# Patient Record
Sex: Female | Born: 1980
Health system: Southern US, Community
[De-identification: ages and names within clinical notes are randomized; demographics above are authoritative.]

## PROBLEM LIST (undated history)

## (undated) DIAGNOSIS — M19172 Post-traumatic osteoarthritis, left ankle and foot: Secondary | ICD-10-CM

## (undated) DIAGNOSIS — Z98811 Dental restoration status: Secondary | ICD-10-CM

## (undated) DIAGNOSIS — T8859XA Other complications of anesthesia, initial encounter: Secondary | ICD-10-CM

## (undated) DIAGNOSIS — G44209 Tension-type headache, unspecified, not intractable: Secondary | ICD-10-CM

## (undated) DIAGNOSIS — Z969 Presence of functional implant, unspecified: Secondary | ICD-10-CM

## (undated) DIAGNOSIS — T4145XA Adverse effect of unspecified anesthetic, initial encounter: Secondary | ICD-10-CM

---

## 1999-05-15 ENCOUNTER — Ambulatory Visit: Admission: RE | Admit: 1999-05-15 | Discharge: 1999-05-15 | Payer: Self-pay | Admitting: Occupational Medicine

## 1999-05-15 ENCOUNTER — Encounter: Payer: Self-pay | Admitting: Occupational Medicine

## 2000-01-04 ENCOUNTER — Emergency Department (HOSPITAL_COMMUNITY): Admission: EM | Admit: 2000-01-04 | Discharge: 2000-01-04 | Payer: Self-pay | Admitting: Emergency Medicine

## 2000-01-15 ENCOUNTER — Emergency Department (HOSPITAL_COMMUNITY): Admission: EM | Admit: 2000-01-15 | Discharge: 2000-01-15 | Payer: Self-pay | Admitting: Emergency Medicine

## 2000-01-21 ENCOUNTER — Emergency Department (HOSPITAL_COMMUNITY): Admission: EM | Admit: 2000-01-21 | Discharge: 2000-01-21 | Payer: Self-pay | Admitting: *Deleted

## 2000-01-27 ENCOUNTER — Emergency Department (HOSPITAL_COMMUNITY): Admission: EM | Admit: 2000-01-27 | Discharge: 2000-01-27 | Payer: Self-pay

## 2000-01-31 ENCOUNTER — Emergency Department (HOSPITAL_COMMUNITY): Admission: EM | Admit: 2000-01-31 | Discharge: 2000-01-31 | Payer: Self-pay | Admitting: Internal Medicine

## 2000-02-23 ENCOUNTER — Emergency Department (HOSPITAL_COMMUNITY): Admission: EM | Admit: 2000-02-23 | Discharge: 2000-02-23 | Payer: Self-pay | Admitting: Emergency Medicine

## 2000-02-24 ENCOUNTER — Emergency Department (HOSPITAL_COMMUNITY): Admission: EM | Admit: 2000-02-24 | Discharge: 2000-02-25 | Payer: Self-pay | Admitting: *Deleted

## 2000-02-25 ENCOUNTER — Encounter: Payer: Self-pay | Admitting: Emergency Medicine

## 2000-02-25 ENCOUNTER — Emergency Department (HOSPITAL_COMMUNITY): Admission: EM | Admit: 2000-02-25 | Discharge: 2000-02-25 | Payer: Self-pay | Admitting: Internal Medicine

## 2000-03-10 ENCOUNTER — Emergency Department (HOSPITAL_COMMUNITY): Admission: EM | Admit: 2000-03-10 | Discharge: 2000-03-10 | Payer: Self-pay | Admitting: Internal Medicine

## 2000-03-20 ENCOUNTER — Emergency Department (HOSPITAL_COMMUNITY): Admission: EM | Admit: 2000-03-20 | Discharge: 2000-03-20 | Payer: Self-pay | Admitting: Emergency Medicine

## 2000-04-06 ENCOUNTER — Emergency Department (HOSPITAL_COMMUNITY): Admission: EM | Admit: 2000-04-06 | Discharge: 2000-04-06 | Payer: Self-pay | Admitting: Emergency Medicine

## 2000-12-15 ENCOUNTER — Other Ambulatory Visit: Admission: RE | Admit: 2000-12-15 | Discharge: 2000-12-15 | Payer: Self-pay | Admitting: Obstetrics & Gynecology

## 2001-01-14 ENCOUNTER — Encounter: Payer: Self-pay | Admitting: Emergency Medicine

## 2001-01-14 ENCOUNTER — Emergency Department (HOSPITAL_COMMUNITY): Admission: EM | Admit: 2001-01-14 | Discharge: 2001-01-14 | Payer: Self-pay | Admitting: Emergency Medicine

## 2001-02-25 ENCOUNTER — Inpatient Hospital Stay (HOSPITAL_COMMUNITY): Admission: AD | Admit: 2001-02-25 | Discharge: 2001-02-25 | Payer: Self-pay | Admitting: Obstetrics and Gynecology

## 2001-02-26 ENCOUNTER — Inpatient Hospital Stay (HOSPITAL_COMMUNITY): Admission: AD | Admit: 2001-02-26 | Discharge: 2001-02-26 | Payer: Self-pay | Admitting: Obstetrics and Gynecology

## 2001-02-26 ENCOUNTER — Encounter: Payer: Self-pay | Admitting: Obstetrics and Gynecology

## 2001-05-09 ENCOUNTER — Inpatient Hospital Stay (HOSPITAL_COMMUNITY): Admission: AD | Admit: 2001-05-09 | Discharge: 2001-05-09 | Payer: Self-pay | Admitting: Obstetrics and Gynecology

## 2001-06-21 ENCOUNTER — Inpatient Hospital Stay (HOSPITAL_COMMUNITY): Admission: AD | Admit: 2001-06-21 | Discharge: 2001-06-21 | Payer: Self-pay | Admitting: Obstetrics and Gynecology

## 2001-06-28 ENCOUNTER — Inpatient Hospital Stay (HOSPITAL_COMMUNITY): Admission: AD | Admit: 2001-06-28 | Discharge: 2001-07-01 | Payer: Self-pay | Admitting: Obstetrics & Gynecology

## 2009-06-11 ENCOUNTER — Emergency Department (HOSPITAL_COMMUNITY): Admission: EM | Admit: 2009-06-11 | Discharge: 2009-06-11 | Payer: Self-pay | Admitting: Emergency Medicine

## 2009-11-08 ENCOUNTER — Emergency Department (HOSPITAL_COMMUNITY): Admission: EM | Admit: 2009-11-08 | Discharge: 2009-11-08 | Payer: Self-pay | Admitting: Emergency Medicine

## 2010-08-19 ENCOUNTER — Emergency Department (HOSPITAL_COMMUNITY)
Admission: EM | Admit: 2010-08-19 | Discharge: 2010-08-19 | Payer: Self-pay | Source: Home / Self Care | Admitting: Family Medicine

## 2010-08-20 LAB — POCT RAPID STREP A (OFFICE): Streptococcus, Group A Screen (Direct): NEGATIVE

## 2010-10-24 LAB — URINE MICROSCOPIC-ADD ON

## 2010-10-24 LAB — COMPREHENSIVE METABOLIC PANEL
ALT: 14 U/L (ref 0–35)
AST: 17 U/L (ref 0–37)
Albumin: 4.1 g/dL (ref 3.5–5.2)
Alkaline Phosphatase: 89 U/L (ref 39–117)
BUN: 6 mg/dL (ref 6–23)
CO2: 24 mEq/L (ref 19–32)
Calcium: 8.7 mg/dL (ref 8.4–10.5)
Chloride: 107 mEq/L (ref 96–112)
Creatinine, Ser: 0.6 mg/dL (ref 0.4–1.2)
GFR calc Af Amer: >60 mL/min (ref >60)
GFR calc non Af Amer: >60 mL/min (ref >60)
Glucose, Bld: 118 mg/dL — ABNORMAL HIGH (ref 70–99)
Potassium: 3.5 mEq/L (ref 3.5–5.1)
Sodium: 139 mEq/L (ref 135–145)
Total Bilirubin: 0.3 mg/dL (ref 0.3–1.2)
Total Protein: 7.6 g/dL (ref 6.0–8.3)

## 2010-10-24 LAB — DIFFERENTIAL
Basophils Absolute: 0 10*3/uL (ref 0.0–0.1)
Basophils Relative: 0 % (ref 0–1)
Eosinophils Absolute: 0 10*3/uL (ref 0.0–0.7)
Eosinophils Relative: 0 % (ref 0–5)
Lymphocytes Relative: 21 % (ref 12–46)
Lymphs Abs: 1.1 10*3/uL (ref 0.7–4.0)
Monocytes Absolute: 0.3 10*3/uL (ref 0.1–1.0)
Monocytes Relative: 5 % (ref 3–12)
Neutro Abs: 4 10*3/uL (ref 1.7–7.7)
Neutrophils Relative %: 74 % (ref 43–77)

## 2010-10-24 LAB — URINALYSIS, ROUTINE W REFLEX MICROSCOPIC
Bilirubin Urine: NEGATIVE
Glucose, UA: NEGATIVE mg/dL
Hgb urine dipstick: NEGATIVE
Ketones, ur: NEGATIVE mg/dL
Nitrite: NEGATIVE
Protein, ur: NEGATIVE mg/dL
Specific Gravity, Urine: 1.021 (ref 1.005–1.030)
Urobilinogen, UA: 0.2 mg/dL (ref 0.0–1.0)
pH: 5.5 (ref 5.0–8.0)

## 2010-10-24 LAB — CBC
HCT: 39.1 % (ref 36.0–46.0)
Hemoglobin: 13.1 g/dL (ref 12.0–15.0)
MCHC: 33.6 g/dL (ref 30.0–36.0)
MCV: 84.6 fL (ref 78.0–100.0)
Platelets: 236 10*3/uL (ref 150–400)
RBC: 4.62 MIL/uL (ref 3.87–5.11)
RDW: 14 % (ref 11.5–15.5)
WBC: 5.5 10*3/uL (ref 4.0–10.5)

## 2010-10-24 LAB — SALICYLATE LEVEL: Salicylate Lvl: <4 mg/dL (ref 2.8–20.0)

## 2010-10-24 LAB — PROTIME-INR
INR: 1.19 (ref 0.00–1.49)
Prothrombin Time: 15 seconds (ref 11.6–15.2)

## 2010-10-24 LAB — ACETAMINOPHEN LEVEL
Acetaminophen (Tylenol), Serum: 10.8 ug/mL (ref 10–30)
Acetaminophen (Tylenol), Serum: 15.7 ug/mL (ref 10–30)

## 2010-10-24 LAB — POCT PREGNANCY, URINE: Preg Test, Ur: NEGATIVE

## 2010-10-24 LAB — PREGNANCY, URINE: Preg Test, Ur: NEGATIVE

## 2010-11-07 LAB — BASIC METABOLIC PANEL
BUN: 8 mg/dL (ref 6–23)
CO2: 27 mEq/L (ref 19–32)
Calcium: 9.1 mg/dL (ref 8.4–10.5)
Chloride: 106 mEq/L (ref 96–112)
Creatinine, Ser: 0.56 mg/dL (ref 0.4–1.2)
GFR calc Af Amer: >60 mL/min (ref >60)
GFR calc non Af Amer: >60 mL/min (ref >60)
Glucose, Bld: 109 mg/dL — ABNORMAL HIGH (ref 70–99)
Potassium: 3.6 mEq/L (ref 3.5–5.1)
Sodium: 139 mEq/L (ref 135–145)

## 2010-11-07 LAB — URINALYSIS, ROUTINE W REFLEX MICROSCOPIC
Glucose, UA: NEGATIVE mg/dL
Hgb urine dipstick: NEGATIVE
Nitrite: NEGATIVE
Protein, ur: NEGATIVE mg/dL
Specific Gravity, Urine: 1.035 — ABNORMAL HIGH (ref 1.005–1.030)
Urobilinogen, UA: 1 mg/dL (ref 0.0–1.0)
pH: 6 (ref 5.0–8.0)

## 2010-11-07 LAB — DIFFERENTIAL
Basophils Absolute: 0 10*3/uL (ref 0.0–0.1)
Basophils Relative: 0 % (ref 0–1)
Eosinophils Absolute: 0 10*3/uL (ref 0.0–0.7)
Eosinophils Relative: 0 % (ref 0–5)
Lymphocytes Relative: 24 % (ref 12–46)
Lymphs Abs: 1.6 10*3/uL (ref 0.7–4.0)
Monocytes Absolute: 0.5 10*3/uL (ref 0.1–1.0)
Monocytes Relative: 8 % (ref 3–12)
Neutro Abs: 4.3 10*3/uL (ref 1.7–7.7)
Neutrophils Relative %: 67 % (ref 43–77)

## 2010-11-07 LAB — CBC
HCT: 38.6 % (ref 36.0–46.0)
Hemoglobin: 13 g/dL (ref 12.0–15.0)
MCHC: 33.8 g/dL (ref 30.0–36.0)
MCV: 83.9 fL (ref 78.0–100.0)
Platelets: 224 10*3/uL (ref 150–400)
RBC: 4.6 MIL/uL (ref 3.87–5.11)
RDW: 13.6 % (ref 11.5–15.5)
WBC: 6.4 10*3/uL (ref 4.0–10.5)

## 2010-11-07 LAB — POCT CARDIAC MARKERS
CKMB, poc: <1 ng/mL — ABNORMAL LOW (ref 1.0–8.0)
Myoglobin, poc: 54.4 ng/mL (ref 12–200)
Troponin i, poc: <0.05 ng/mL (ref 0.00–0.09)

## 2010-11-07 LAB — D-DIMER, QUANTITATIVE: D-Dimer, Quant: 0.27 ug/mL-FEU (ref 0.00–0.48)

## 2010-11-07 LAB — PREGNANCY, URINE: Preg Test, Ur: NEGATIVE

## 2014-12-20 ENCOUNTER — Encounter (HOSPITAL_COMMUNITY): Payer: Self-pay | Admitting: *Deleted

## 2014-12-20 ENCOUNTER — Emergency Department (HOSPITAL_COMMUNITY): Payer: PRIVATE HEALTH INSURANCE

## 2014-12-20 ENCOUNTER — Emergency Department (HOSPITAL_COMMUNITY)
Admission: EM | Admit: 2014-12-20 | Discharge: 2014-12-20 | Disposition: A | Discharge status: Home / Self Care | Payer: PRIVATE HEALTH INSURANCE | Attending: Emergency Medicine | Admitting: Emergency Medicine

## 2014-12-20 DIAGNOSIS — W1839XA Other fall on same level, initial encounter: Secondary | ICD-10-CM | POA: Diagnosis not present

## 2014-12-20 DIAGNOSIS — S82852A Displaced trimalleolar fracture of left lower leg, initial encounter for closed fracture: Secondary | ICD-10-CM | POA: Insufficient documentation

## 2014-12-20 DIAGNOSIS — S82892A Other fracture of left lower leg, initial encounter for closed fracture: Secondary | ICD-10-CM

## 2014-12-20 DIAGNOSIS — S99912A Unspecified injury of left ankle, initial encounter: Secondary | ICD-10-CM | POA: Diagnosis present

## 2014-12-20 DIAGNOSIS — Y9289 Other specified places as the place of occurrence of the external cause: Secondary | ICD-10-CM | POA: Insufficient documentation

## 2014-12-20 DIAGNOSIS — Y998 Other external cause status: Secondary | ICD-10-CM | POA: Diagnosis not present

## 2014-12-20 DIAGNOSIS — Y9389 Activity, other specified: Secondary | ICD-10-CM | POA: Insufficient documentation

## 2014-12-20 LAB — BASIC METABOLIC PANEL
Anion gap: 12 (ref 5–15)
BUN: 9 mg/dL (ref 6–20)
CALCIUM: 8.8 mg/dL — AB (ref 8.9–10.3)
CO2: 21 mmol/L — AB (ref 22–32)
Chloride: 106 mmol/L (ref 101–111)
Creatinine, Ser: 0.61 mg/dL (ref 0.44–1.00)
GFR calc non Af Amer: >60 mL/min (ref >60)
Glucose, Bld: 97 mg/dL (ref 65–99)
Potassium: 3.3 mmol/L — ABNORMAL LOW (ref 3.5–5.1)
SODIUM: 139 mmol/L (ref 135–145)

## 2014-12-20 LAB — CBC WITH DIFFERENTIAL/PLATELET
BASOS PCT: 0 % (ref 0–1)
Basophils Absolute: 0 10*3/uL (ref 0.0–0.1)
Eosinophils Absolute: 0.1 10*3/uL (ref 0.0–0.7)
Eosinophils Relative: 1 % (ref 0–5)
HEMATOCRIT: 36.7 % (ref 36.0–46.0)
HEMOGLOBIN: 12.4 g/dL (ref 12.0–15.0)
LYMPHS ABS: 2.2 10*3/uL (ref 0.7–4.0)
Lymphocytes Relative: 29 % (ref 12–46)
MCH: 27.1 pg (ref 26.0–34.0)
MCHC: 33.8 g/dL (ref 30.0–36.0)
MCV: 80.1 fL (ref 78.0–100.0)
MONO ABS: 0.6 10*3/uL (ref 0.1–1.0)
MONOS PCT: 8 % (ref 3–12)
NEUTROS ABS: 4.8 10*3/uL (ref 1.7–7.7)
Neutrophils Relative %: 62 % (ref 43–77)
Platelets: 275 10*3/uL (ref 150–400)
RBC: 4.58 MIL/uL (ref 3.87–5.11)
RDW: 13.3 % (ref 11.5–15.5)
WBC: 7.6 10*3/uL (ref 4.0–10.5)

## 2014-12-20 MED ORDER — PROPOFOL 10 MG/ML IV BOLUS
INTRAVENOUS | Status: AC | PRN
  Administered 2014-12-20: 30 mg via INTRAVENOUS

## 2014-12-20 MED ORDER — MORPHINE SULFATE 4 MG/ML IJ SOLN
4.0000 mg | Freq: Once | INTRAMUSCULAR | Status: AC
  Administered 2014-12-20: 4 mg via INTRAVENOUS
  Filled 2014-12-20: qty 1

## 2014-12-20 MED ORDER — PROPOFOL 10 MG/ML IV BOLUS
INTRAVENOUS | Status: AC | PRN
  Administered 2014-12-20: 20 mg via INTRAVENOUS

## 2014-12-20 MED ORDER — SODIUM CHLORIDE 0.9 % IV BOLUS (SEPSIS)
1000.0000 mL | Freq: Once | INTRAVENOUS | Status: AC
  Administered 2014-12-20: 1000 mL via INTRAVENOUS

## 2014-12-20 MED ORDER — PROPOFOL 10 MG/ML IV BOLUS
0.5000 mg/kg | Freq: Once | INTRAVENOUS | Status: DC
  Filled 2014-12-20: qty 1

## 2014-12-20 MED ORDER — HYDROMORPHONE HCL 1 MG/ML IJ SOLN
1.0000 mg | Freq: Once | INTRAMUSCULAR | Status: AC
Start: 2014-12-20 — End: 2014-12-20
  Administered 2014-12-20: 1 mg via INTRAVENOUS
  Filled 2014-12-20: qty 1

## 2014-12-20 MED ORDER — PROPOFOL 10 MG/ML IV BOLUS
INTRAVENOUS | Status: AC | PRN
  Administered 2014-12-20: 60 mg via INTRAVENOUS

## 2014-12-20 MED ORDER — PROPOFOL 10 MG/ML IV BOLUS
INTRAVENOUS | Status: AC
  Filled 2014-12-20: qty 1

## 2014-12-20 MED ORDER — PROPOFOL 1000 MG/100ML IV EMUL
INTRAVENOUS | Status: AC
  Filled 2014-12-20: qty 100

## 2014-12-20 MED ORDER — OXYCODONE-ACETAMINOPHEN 5-325 MG PO TABS: 2.0000 | ORAL_TABLET | ORAL | Status: DC | PRN

## 2014-12-20 NOTE — ED Notes (Addendum)
EMS contacted d/t patient fall. Patient stated that she is uncertain cause, she denied dizziness, weakness. Pt  sitting in a rolling chair when attempting to get up fall occurred. NO LOC and or neck/back pain reported.  Left ankle pain 10/10,edematous, deformity. 200 mcg fentanyl given in the field, 50 mcg left

## 2014-12-20 NOTE — ED Provider Notes (Signed)
CSN: [id number]     Arrival date & time 12/20/14  1825 History   First MD Initiated Contact with Patient 12/20/14 1839     Chief Complaint  Patient presents with  . Fall   HPI   34 year old female presents with left ankle pain. Patient reports that she was sitting in a chair wearing high heels when she stood up causing her left ankle to roll up with excruciating pain, falling to the floor. Patient denies any other injuries other than the ankle from the fall, reports 10 out of 10 pain, obvious deformity. No history of trauma to the ankle previously, reports only mechanical fall, no associated symptoms, 150 mics given via EMS. Reports distal sensation and perfusion intact. Reports that she has not been drinking today she's been fasting.     Past Medical History  Diagnosis Date  . Heart palpitations    History reviewed. No pertinent past surgical history. History reviewed. No pertinent family history. History  Substance Use Topics  . Smoking status: Never Smoker   . Smokeless tobacco: Not on file  . Alcohol Use: No   OB History    No data available     Review of Systems  All other systems reviewed and are negative.  Allergies  Review of patient's allergies indicates no known allergies.  Home Medications   Prior to Admission medications   Not on File   BP 142/74 mmHg  Pulse 96  Resp 22  SpO2 100%  LMP 12/11/2014 (Approximate)   Physical Exam  Constitutional: She is oriented to person, place, and time. She appears well-developed and well-nourished.  HENT:  Head: Normocephalic and atraumatic.  Eyes: Conjunctivae are normal. Pupils are equal, round, and reactive to light. Right eye exhibits no discharge. Left eye exhibits no discharge. No scleral icterus.  Neck: Normal range of motion. No JVD present. No tracheal deviation present.  Pulmonary/Chest: Effort normal. No stridor.  Musculoskeletal:  Obvious deformity to the left distal ankle, swelling, no skin tinting.    Neurological: She is alert and oriented to person, place, and time. Coordination normal.  Psychiatric: She has a normal mood and affect. Her behavior is normal. Judgment and thought content normal.  Nursing note and vitals reviewed.   ED Course  Procedures (including critical care time) Labs Review Labs Reviewed - No data to display  Imaging Review No results found.   EKG Interpretation None      MDM   Final diagnoses:  Ankle fracture, left, closed, initial encounter   Labs: cbc, BMP  Imaging: DG ankle trimalleolar fracture  Consults: Orthopedics  Therapeutics: Propofol, morphine, Dilaudid  Assessment: Trimalleolar fracture of her left ankle  Plan: Patient percent with trimalleolar fracture left ankle, closed. Dr. Patria Maneampos consult to orthopedic surgery who recommended reduction, splint, follow up tomorrow morning in the office. Bedside sedation and successful reduction was performed without complication. Patient was discharged home with pain medication, instruction to follow up with with previous tomorrow. Strict precautions are given, patient verbalized understanding and agreement with this plan and had no further questions at the time of discharge     Eyvonne MechanicJeffrey Abdulaziz Toman, PA-C 12/21/14 0056  Azalia BilisKevin Campos, MD 12/21/14 0100

## 2014-12-20 NOTE — Discharge Instructions (Signed)
Please follow up with Dr. Magnus IvanBlackman tomorrow for further evaluation and management, return to the emergency room immediately if new or worsening signs or symptoms present.

## 2014-12-20 NOTE — ED Notes (Signed)
Patient transported to X-ray 

## 2014-12-20 NOTE — ED Notes (Signed)
Bed: [id number] Expected date:  Expected time:  Means of arrival:  Comments: EMS- 34yo F, ankle injury, deformity

## 2014-12-22 ENCOUNTER — Other Ambulatory Visit (HOSPITAL_COMMUNITY): Payer: Self-pay | Admitting: Orthopaedic Surgery

## 2014-12-26 ENCOUNTER — Encounter (HOSPITAL_COMMUNITY): Payer: Self-pay | Admitting: *Deleted

## 2014-12-26 MED ORDER — CEFAZOLIN SODIUM 10 G IJ SOLR
3.0000 g | INTRAMUSCULAR | Status: AC
  Administered 2014-12-27: 3 g via INTRAVENOUS
  Filled 2014-12-26: qty 3000

## 2014-12-26 NOTE — Progress Notes (Signed)
Ms Lisa Le states that she has some dizziness and vomiting.Patient states that she has been in bed most of the day, she got out of be and became nausea and vomited.  Patient states that she ate breakfast and that she had taken a pain pill and nausea medication.  Patient states that she has been sleeping most of the day, after taking Phenergan and has not been taking fluids, but that she is going to drink some tonic water now that her sister  is home.

## 2014-12-27 ENCOUNTER — Encounter (HOSPITAL_COMMUNITY)
Admission: RE | Disposition: A | Discharge status: Home / Self Care | Payer: Self-pay | Source: Ambulatory Visit | Attending: Orthopaedic Surgery

## 2014-12-27 ENCOUNTER — Observation Stay (HOSPITAL_COMMUNITY)
Admission: RE | Admit: 2014-12-27 | Discharge: 2014-12-29 | Disposition: A | Discharge status: Home / Self Care | Payer: PRIVATE HEALTH INSURANCE | Source: Ambulatory Visit | Attending: Orthopaedic Surgery | Admitting: Orthopaedic Surgery

## 2014-12-27 ENCOUNTER — Ambulatory Visit (HOSPITAL_COMMUNITY): Payer: PRIVATE HEALTH INSURANCE | Admitting: Certified Registered"

## 2014-12-27 ENCOUNTER — Ambulatory Visit (HOSPITAL_COMMUNITY): Payer: PRIVATE HEALTH INSURANCE

## 2014-12-27 ENCOUNTER — Encounter (HOSPITAL_COMMUNITY): Payer: Self-pay | Admitting: General Practice

## 2014-12-27 DIAGNOSIS — Y9269 Other specified industrial and construction area as the place of occurrence of the external cause: Secondary | ICD-10-CM | POA: Insufficient documentation

## 2014-12-27 DIAGNOSIS — S82852A Displaced trimalleolar fracture of left lower leg, initial encounter for closed fracture: Secondary | ICD-10-CM | POA: Diagnosis not present

## 2014-12-27 DIAGNOSIS — M25572 Pain in left ankle and joints of left foot: Secondary | ICD-10-CM | POA: Diagnosis present

## 2014-12-27 DIAGNOSIS — Y990 Civilian activity done for income or pay: Secondary | ICD-10-CM | POA: Diagnosis not present

## 2014-12-27 DIAGNOSIS — Z419 Encounter for procedure for purposes other than remedying health state, unspecified: Secondary | ICD-10-CM

## 2014-12-27 DIAGNOSIS — Z91040 Latex allergy status: Secondary | ICD-10-CM | POA: Insufficient documentation

## 2014-12-27 DIAGNOSIS — W19XXXA Unspecified fall, initial encounter: Secondary | ICD-10-CM | POA: Insufficient documentation

## 2014-12-27 DIAGNOSIS — S82853A Displaced trimalleolar fracture of unspecified lower leg, initial encounter for closed fracture: Secondary | ICD-10-CM | POA: Diagnosis present

## 2014-12-27 HISTORY — PX: ORIF ANKLE FRACTURE: SHX5408

## 2014-12-27 LAB — SURGICAL PCR SCREEN
MRSA, PCR: NEGATIVE
Staphylococcus aureus: NEGATIVE

## 2014-12-27 LAB — HCG, SERUM, QUALITATIVE: PREG SERUM: NEGATIVE

## 2014-12-27 SURGERY — OPEN REDUCTION INTERNAL FIXATION (ORIF) ANKLE FRACTURE
Anesthesia: General | Site: Ankle | Laterality: Left

## 2014-12-27 MED ORDER — 0.9 % SODIUM CHLORIDE (POUR BTL) OPTIME
TOPICAL | Status: DC | PRN
  Administered 2014-12-27: 1000 mL

## 2014-12-27 MED ORDER — FENTANYL CITRATE (PF) 100 MCG/2ML IJ SOLN
50.0000 ug | INTRAMUSCULAR | Status: AC | PRN
  Administered 2014-12-27 (×3): 50 ug via INTRAVENOUS

## 2014-12-27 MED ORDER — ROCURONIUM BROMIDE 50 MG/5ML IV SOLN
INTRAVENOUS | Status: AC
  Filled 2014-12-27: qty 1

## 2014-12-27 MED ORDER — HYDROMORPHONE HCL 1 MG/ML IJ SOLN
INTRAMUSCULAR | Status: AC
Start: 2014-12-27 — End: 2014-12-28
  Filled 2014-12-27: qty 1

## 2014-12-27 MED ORDER — PROPRANOLOL HCL 1 MG/ML IV SOLN
INTRAVENOUS | Status: DC | PRN
  Administered 2014-12-27 (×2): .5 mg via INTRAVENOUS

## 2014-12-27 MED ORDER — ACETAMINOPHEN 650 MG RE SUPP: 650.0000 mg | Freq: Four times a day (QID) | RECTAL | Status: DC | PRN

## 2014-12-27 MED ORDER — HYDROMORPHONE HCL 1 MG/ML IJ SOLN
0.5000 mg | INTRAMUSCULAR | Status: DC | PRN
  Administered 2014-12-27 (×2): 0.5 mg via INTRAVENOUS

## 2014-12-27 MED ORDER — MUPIROCIN 2 % EX OINT
TOPICAL_OINTMENT | Freq: Two times a day (BID) | CUTANEOUS | Status: DC
  Administered 2014-12-27: 1 via NASAL

## 2014-12-27 MED ORDER — ONDANSETRON HCL 4 MG/2ML IJ SOLN
4.0000 mg | Freq: Once | INTRAMUSCULAR | Status: DC | PRN
Start: 2014-12-27 — End: 2014-12-27

## 2014-12-27 MED ORDER — FENTANYL CITRATE (PF) 100 MCG/2ML IJ SOLN
INTRAMUSCULAR | Status: AC
  Filled 2014-12-27: qty 2

## 2014-12-27 MED ORDER — MIDAZOLAM HCL 2 MG/2ML IJ SOLN
INTRAMUSCULAR | Status: AC
  Filled 2014-12-27: qty 2

## 2014-12-27 MED ORDER — ALBUTEROL SULFATE HFA 108 (90 BASE) MCG/ACT IN AERS
INHALATION_SPRAY | RESPIRATORY_TRACT | Status: DC | PRN
  Administered 2014-12-27: 4 via RESPIRATORY_TRACT

## 2014-12-27 MED ORDER — GLYCOPYRROLATE 0.2 MG/ML IJ SOLN
INTRAMUSCULAR | Status: DC | PRN
  Administered 2014-12-27: 0.4 mg via INTRAVENOUS

## 2014-12-27 MED ORDER — METOCLOPRAMIDE HCL 5 MG/ML IJ SOLN
5.0000 mg | Freq: Three times a day (TID) | INTRAMUSCULAR | Status: DC | PRN
  Administered 2014-12-28 (×2): 10 mg via INTRAVENOUS
  Filled 2014-12-27 (×2): qty 2

## 2014-12-27 MED ORDER — ACETAMINOPHEN 325 MG PO TABS
650.0000 mg | ORAL_TABLET | Freq: Four times a day (QID) | ORAL | Status: DC | PRN
  Administered 2014-12-29 (×2): 650 mg via ORAL
  Filled 2014-12-27 (×2): qty 2

## 2014-12-27 MED ORDER — MUPIROCIN 2 % EX OINT
TOPICAL_OINTMENT | CUTANEOUS | Status: AC
  Filled 2014-12-27: qty 22

## 2014-12-27 MED ORDER — MIDAZOLAM HCL 5 MG/5ML IJ SOLN
INTRAMUSCULAR | Status: DC | PRN
  Administered 2014-12-27: 2 mg via INTRAVENOUS

## 2014-12-27 MED ORDER — HYDROMORPHONE HCL 1 MG/ML IJ SOLN
1.0000 mg | INTRAMUSCULAR | Status: DC | PRN
  Administered 2014-12-27 – 2014-12-28 (×8): 1 mg via INTRAVENOUS
  Filled 2014-12-27 (×8): qty 1

## 2014-12-27 MED ORDER — CEFAZOLIN SODIUM 1-5 GM-% IV SOLN
1.0000 g | Freq: Four times a day (QID) | INTRAVENOUS | Status: AC
  Administered 2014-12-27 – 2014-12-28 (×3): 1 g via INTRAVENOUS
  Filled 2014-12-27 (×3): qty 50

## 2014-12-27 MED ORDER — FENTANYL CITRATE (PF) 250 MCG/5ML IJ SOLN
INTRAMUSCULAR | Status: AC
  Filled 2014-12-27: qty 5

## 2014-12-27 MED ORDER — PROPOFOL 10 MG/ML IV BOLUS
INTRAVENOUS | Status: AC
  Filled 2014-12-27: qty 20

## 2014-12-27 MED ORDER — ROCURONIUM BROMIDE 100 MG/10ML IV SOLN
INTRAVENOUS | Status: DC | PRN
  Administered 2014-12-27: 40 mg via INTRAVENOUS

## 2014-12-27 MED ORDER — LACTATED RINGERS IV SOLN
INTRAVENOUS | Status: DC
  Administered 2014-12-27 (×2): via INTRAVENOUS

## 2014-12-27 MED ORDER — LIDOCAINE HCL (CARDIAC) 20 MG/ML IV SOLN
INTRAVENOUS | Status: AC
  Filled 2014-12-27: qty 10

## 2014-12-27 MED ORDER — NEOSTIGMINE METHYLSULFATE 10 MG/10ML IV SOLN
INTRAVENOUS | Status: DC | PRN
  Administered 2014-12-27: 3 mg via INTRAVENOUS

## 2014-12-27 MED ORDER — OXYCODONE HCL 5 MG PO TABS
ORAL_TABLET | ORAL | Status: AC
  Filled 2014-12-27: qty 2

## 2014-12-27 MED ORDER — ONDANSETRON HCL 4 MG PO TABS: 4.0000 mg | ORAL_TABLET | Freq: Four times a day (QID) | ORAL | Status: DC | PRN

## 2014-12-27 MED ORDER — ONDANSETRON HCL 4 MG/2ML IJ SOLN
INTRAMUSCULAR | Status: DC | PRN
  Administered 2014-12-27: 4 mg via INTRAVENOUS

## 2014-12-27 MED ORDER — ONDANSETRON HCL 4 MG/2ML IJ SOLN
4.0000 mg | Freq: Four times a day (QID) | INTRAMUSCULAR | Status: DC | PRN
  Administered 2014-12-27 – 2014-12-28 (×3): 4 mg via INTRAVENOUS
  Filled 2014-12-27 (×3): qty 2

## 2014-12-27 MED ORDER — DIPHENHYDRAMINE HCL 12.5 MG/5ML PO ELIX
12.5000 mg | ORAL_SOLUTION | ORAL | Status: DC | PRN
Start: 2014-12-27 — End: 2014-12-29

## 2014-12-27 MED ORDER — METHOCARBAMOL 1000 MG/10ML IJ SOLN
500.0000 mg | Freq: Four times a day (QID) | INTRAVENOUS | Status: DC | PRN
  Filled 2014-12-27: qty 5

## 2014-12-27 MED ORDER — PROPOFOL 10 MG/ML IV BOLUS
INTRAVENOUS | Status: DC | PRN
  Administered 2014-12-27: 150 mg via INTRAVENOUS

## 2014-12-27 MED ORDER — FENTANYL CITRATE (PF) 100 MCG/2ML IJ SOLN
INTRAMUSCULAR | Status: DC | PRN
  Administered 2014-12-27: 100 ug via INTRAVENOUS
  Administered 2014-12-27: 50 ug via INTRAVENOUS
  Administered 2014-12-27: 100 ug via INTRAVENOUS

## 2014-12-27 MED ORDER — METHOCARBAMOL 500 MG PO TABS
500.0000 mg | ORAL_TABLET | Freq: Four times a day (QID) | ORAL | Status: DC | PRN
  Administered 2014-12-27 – 2014-12-29 (×4): 500 mg via ORAL
  Filled 2014-12-27 (×5): qty 1

## 2014-12-27 MED ORDER — ONDANSETRON HCL 4 MG/2ML IJ SOLN
INTRAMUSCULAR | Status: AC
  Filled 2014-12-27: qty 2

## 2014-12-27 MED ORDER — SODIUM CHLORIDE 0.9 % IV SOLN
INTRAVENOUS | Status: DC
  Administered 2014-12-27 – 2014-12-28 (×2): via INTRAVENOUS

## 2014-12-27 MED ORDER — METOCLOPRAMIDE HCL 5 MG PO TABS: 5.0000 mg | ORAL_TABLET | Freq: Three times a day (TID) | ORAL | Status: DC | PRN

## 2014-12-27 MED ORDER — LIDOCAINE HCL (CARDIAC) 20 MG/ML IV SOLN
INTRAVENOUS | Status: DC | PRN
  Administered 2014-12-27: 60 mg via INTRAVENOUS

## 2014-12-27 MED ORDER — OXYCODONE HCL 5 MG PO TABS
5.0000 mg | ORAL_TABLET | ORAL | Status: DC | PRN
  Administered 2014-12-27: 10 mg via ORAL
  Administered 2014-12-27 – 2014-12-28 (×2): 15 mg via ORAL
  Administered 2014-12-28: 5 mg via ORAL
  Administered 2014-12-28: 10 mg via ORAL
  Administered 2014-12-28: 15 mg via ORAL
  Administered 2014-12-28 (×2): 10 mg via ORAL
  Administered 2014-12-28: 15 mg via ORAL
  Administered 2014-12-29: 5 mg via ORAL
  Administered 2014-12-29: 15 mg via ORAL
  Filled 2014-12-27: qty 1
  Filled 2014-12-27: qty 2
  Filled 2014-12-27: qty 3
  Filled 2014-12-27: qty 2
  Filled 2014-12-27 (×4): qty 3
  Filled 2014-12-27: qty 1
  Filled 2014-12-27: qty 2

## 2014-12-27 SURGICAL SUPPLY — 69 items
BANDAGE ELASTIC 4 VELCRO ST LF (GAUZE/BANDAGES/DRESSINGS) ×2 IMPLANT
BANDAGE ELASTIC 6 VELCRO ST LF (GAUZE/BANDAGES/DRESSINGS) ×2 IMPLANT
BANDAGE ESMARK 6X9 LF (GAUZE/BANDAGES/DRESSINGS) ×1 IMPLANT
BIT DRILL 2.5X110 QC LCP DISP (BIT) ×2 IMPLANT
BIT DRILL CANN 2.7X625 NONSTRL (BIT) ×2 IMPLANT
BIT DRILL QC 3.5X110 (BIT) ×2 IMPLANT
BNDG ESMARK 6X9 LF (GAUZE/BANDAGES/DRESSINGS) ×2
COVER SURGICAL LIGHT HANDLE (MISCELLANEOUS) ×2 IMPLANT
CUFF TOURNIQUET SINGLE 34IN LL (TOURNIQUET CUFF) ×2 IMPLANT
CUFF TOURNIQUET SINGLE 44IN (TOURNIQUET CUFF) IMPLANT
DRAPE C-ARM 42X72 X-RAY (DRAPES) ×2 IMPLANT
DRAPE C-ARMOR (DRAPES) ×2 IMPLANT
DRAPE U-SHAPE 47X51 STRL (DRAPES) ×2 IMPLANT
DRSG PAD ABDOMINAL 8X10 ST (GAUZE/BANDAGES/DRESSINGS) ×2 IMPLANT
DURAPREP 26ML APPLICATOR (WOUND CARE) ×2 IMPLANT
ELECT REM PT RETURN 9FT ADLT (ELECTROSURGICAL) ×2
ELECTRODE REM PT RTRN 9FT ADLT (ELECTROSURGICAL) ×1 IMPLANT
GAUZE SPONGE 4X4 12PLY STRL (GAUZE/BANDAGES/DRESSINGS) ×2 IMPLANT
GAUZE XEROFORM 5X9 LF (GAUZE/BANDAGES/DRESSINGS) ×2 IMPLANT
GLOVE BIO SURGEON STRL SZ8 (GLOVE) IMPLANT
GLOVE BIOGEL PI IND STRL 6.5 (GLOVE) ×2 IMPLANT
GLOVE BIOGEL PI INDICATOR 6.5 (GLOVE) ×2
GLOVE ORTHO TXT STRL SZ7.5 (GLOVE) IMPLANT
GLOVE SURG SS PI 6.5 STRL IVOR (GLOVE) ×4 IMPLANT
GLOVE SURG SS PI 7.5 STRL IVOR (GLOVE) ×4 IMPLANT
GLOVE SURG SS PI 8.0 STRL IVOR (GLOVE) ×2 IMPLANT
GOWN STRL REUS W/ TWL LRG LVL3 (GOWN DISPOSABLE) ×2 IMPLANT
GOWN STRL REUS W/ TWL XL LVL3 (GOWN DISPOSABLE) ×4 IMPLANT
GOWN STRL REUS W/TWL LRG LVL3 (GOWN DISPOSABLE) ×2
GOWN STRL REUS W/TWL XL LVL3 (GOWN DISPOSABLE) ×4
GUIDEWIRE THREADED 150MM (WIRE) ×2 IMPLANT
KIT BASIN OR (CUSTOM PROCEDURE TRAY) ×2 IMPLANT
KIT ROOM TURNOVER OR (KITS) ×2 IMPLANT
MANIFOLD NEPTUNE II (INSTRUMENTS) ×2 IMPLANT
NEEDLE HYPO 25GX1X1/2 BEV (NEEDLE) IMPLANT
NS IRRIG 1000ML POUR BTL (IV SOLUTION) ×2 IMPLANT
PACK ORTHO EXTREMITY (CUSTOM PROCEDURE TRAY) ×2 IMPLANT
PAD ARMBOARD 7.5X6 YLW CONV (MISCELLANEOUS) ×4 IMPLANT
PAD CAST 4YDX4 CTTN HI CHSV (CAST SUPPLIES) ×1 IMPLANT
PADDING CAST COTTON 4X4 STRL (CAST SUPPLIES) ×1
PADDING CAST COTTON 6X4 STRL (CAST SUPPLIES) ×2 IMPLANT
PLATE LCP 3.5 1/3 TUB 7HX81 (Plate) ×2 IMPLANT
PREFILTER NEPTUNE (MISCELLANEOUS) ×2 IMPLANT
SCREW CANC FT ST SFS 4X14 (Screw) ×2 IMPLANT
SCREW CANC FT ST SFS 4X16 (Screw) ×4 IMPLANT
SCREW CANN S THRD/50 4.0 (Screw) ×4 IMPLANT
SCREW CORTEX 3.5 12MM (Screw) ×1 IMPLANT
SCREW CORTEX 3.5 14MM (Screw) ×1 IMPLANT
SCREW CORTEX 3.5 16MM (Screw) ×1 IMPLANT
SCREW LOCK CORT ST 3.5X12 (Screw) ×1 IMPLANT
SCREW LOCK CORT ST 3.5X14 (Screw) ×1 IMPLANT
SCREW LOCK CORT ST 3.5X16 (Screw) ×1 IMPLANT
SPLINT PLASTER CAST XFAST 5X30 (CAST SUPPLIES) ×1 IMPLANT
SPLINT PLASTER XFAST SET 5X30 (CAST SUPPLIES) ×1
SPONGE GAUZE 4X4 12PLY STER LF (GAUZE/BANDAGES/DRESSINGS) ×2 IMPLANT
SPONGE LAP 4X18 X RAY DECT (DISPOSABLE) ×4 IMPLANT
SUCTION FRAZIER TIP 10 FR DISP (SUCTIONS) ×2 IMPLANT
SUT ETHILON 2 0 FS 18 (SUTURE) ×6 IMPLANT
SUT VIC AB 0 CT1 27 (SUTURE) ×1
SUT VIC AB 0 CT1 27XBRD ANBCTR (SUTURE) ×1 IMPLANT
SUT VIC AB 2-0 CT1 27 (SUTURE) ×2
SUT VIC AB 2-0 CT1 27XBRD (SUTURE) ×1 IMPLANT
SUT VIC AB 2-0 CT1 TAPERPNT 27 (SUTURE) ×1 IMPLANT
SUT VICRYL 0 CT 1 36IN (SUTURE) ×2 IMPLANT
SYR CONTROL 10ML LL (SYRINGE) IMPLANT
TOWEL OR 17X24 6PK STRL BLUE (TOWEL DISPOSABLE) ×2 IMPLANT
TOWEL OR 17X26 10 PK STRL BLUE (TOWEL DISPOSABLE) ×2 IMPLANT
TUBE CONNECTING 12X1/4 (SUCTIONS) ×2 IMPLANT
WATER STERILE IRR 1000ML POUR (IV SOLUTION) ×2 IMPLANT

## 2014-12-27 NOTE — Progress Notes (Signed)
Patient complaining of 8/10 left foot pain.  Notified anesthesiologist.

## 2014-12-27 NOTE — Anesthesia Procedure Notes (Addendum)
Procedure Name: Intubation Date/Time: 12/27/2014 3:39 PM Performed by: Charm BargesBUTLER, DAVID R Pre-anesthesia Checklist: Patient identified, Emergency Drugs available, Suction available, Patient being monitored and Timeout performed Patient Re-evaluated:Patient Re-evaluated prior to inductionOxygen Delivery Method: Circle system utilized Preoxygenation: Pre-oxygenation with 100% oxygen Intubation Type: IV induction Ventilation: Mask ventilation without difficulty Laryngoscope Size: Mac and 3 Grade View: Grade II Tube type: Oral Tube size: 7.5 mm Number of attempts: 2 (Unable to pass ETT through VC with Grade II view, Repositioned laryngoscope blade to center of tongue and AOI) Airway Equipment and Method: Stylet Placement Confirmation: ETT inserted through vocal cords under direct vision,  positive ETCO2 and breath sounds checked- equal and bilateral Secured at: 22 cm Tube secured with: Tape Dental Injury: Teeth and Oropharynx as per pre-operative assessment    Anesthesia Regional Block:  Popliteal block  Pre-Anesthetic Checklist: ,, timeout performed, Correct Patient, Correct Site, Correct Laterality, Correct Procedure, Correct Position, site marked, Risks and benefits discussed,  Surgical consent,  Pre-op evaluation,  At surgeon's request and post-op pain management  Laterality: Left  Prep: Maximum Sterile Barrier Precautions used, chloraprep and alcohol swabs       Needles:  Injection technique: Single-shot  Needle Type: Stimulator Needle - 80          Additional Needles:  Procedures: Doppler guided Popliteal block Narrative:  Start time: 12/27/2014 3:15 PM End time: 12/27/2014 3:23 PM Injection made incrementally with aspirations every 5 mL.  Performed by: Personally  Anesthesiologist: Laverle HobbySMITH, Lanita Stammen  Additional Notes: Pt accepts procedure w/ risks. 25 cc 0.35% marcaine w/o epi w/ minor difficulty and mild discomfort. GES

## 2014-12-27 NOTE — H&P (Signed)
Lisa GrahamKarima Le is an 34 y.o. female.   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  She was seen in the ED and found to have a left ankle fracture/dislocation.  This was reduced and splinted.  She now presents for definitive fixation of the left ankle given this unstable fracture.  She understands fully the need for surgery as well as the risks of infection, nerve and vessel injury, DVT, non-union, and post-traumatic arthritis.  Past Medical History  Diagnosis Date  . Heart palpitations 2012  . Headache   . Constipation     Past Surgical History  Procedure Laterality Date  . No past surgeries      History reviewed. No pertinent family history. Social History:  reports that she has never smoked. She does not have any smokeless tobacco history on file. She reports that she does not drink alcohol or use illicit drugs.  Allergies:  Allergies  Allergen Reactions  . Latex Swelling    No prescriptions prior to admission    No results found for this or any previous visit (from the past 48 hour(s)). No results found.  Review of Systems  All other systems reviewed and are negative.   Height 5\' 9"  (1.753 m), weight 123.378 kg (272 lb), last menstrual period 12/11/2014. Physical Exam  Constitutional: She is oriented to person, place, and time. She appears well-developed and well-nourished.  HENT:  Head: Normocephalic and atraumatic.  Eyes: EOM are normal. Pupils are equal, round, and reactive to light.  Neck: Normal range of motion. Neck supple.  Cardiovascular: Normal rate and regular rhythm.   Respiratory: Effort normal and breath sounds normal.  GI: Soft. Bowel sounds are normal.  Musculoskeletal:       Left ankle: She exhibits swelling, ecchymosis and deformity. Tenderness. Lateral malleolus, medial malleolus, AITFL and posterior TFL tenderness found.   Neurological: She is alert and oriented to person, place, and time.  Skin: Skin is warm.  Psychiatric: She has a normal mood and affect.     Assessment/Plan Left trimalleolar ankle fracture/dislocation post an accidental fall 1)  To the OR today for open reduction/internal fixation of her complex left ankle fracture.  She will be admitted overnight following surgery for PT, IV antibiotics, as well as IV pain and nausea meds.  Kathryne HitchBLACKMAN,CHRISTOPHER Y 12/27/2014, 12:22 PM

## 2014-12-27 NOTE — Brief Op Note (Signed)
12/27/2014  5:09 PM  PATIENT:  Lisa GrahamKarima Le  34 y.o. female  PRE-OPERATIVE DIAGNOSIS:  left trimalleolar ankle fracture  POST-OPERATIVE DIAGNOSIS:  left trimalleolar ankle fracture  PROCEDURE:  Procedure(s): OPEN REDUCTION INTERNAL FIXATION (ORIF) LEFT ANKLE FRACTURE (Left)  SURGEON:  Surgeon(s) and Role:    * Kathryne Hitchhristopher Y Lucero Auzenne, MD - Primary  PHYSICIAN ASSISTANT: Rexene EdisonGil Clark, PA-C  ANESTHESIA:   regional and general  EBL:  Total I/O In: 1000 [I.V.:1000] Out: -   BLOOD ADMINISTERED:none  DRAINS: none   LOCAL MEDICATIONS USED:  NONE  SPECIMEN:  No Specimen  DISPOSITION OF SPECIMEN:  N/A  COUNTS:  YES  TOURNIQUET:  * Missing tourniquet times found for documented tourniquets in log:  [id number] *  DICTATION: .Other Dictation: Dictation Number [id number]  PLAN OF CARE: Admit for overnight observation  PATIENT DISPOSITION:  PACU - hemodynamically stable.   Delay start of Pharmacological VTE agent (>24hrs) due to surgical blood loss or risk of bleeding: no

## 2014-12-27 NOTE — Transfer of Care (Signed)
Immediate Anesthesia Transfer of Care Note  Patient: Lisa Le  Procedure(s) Performed: Procedure(s): OPEN REDUCTION INTERNAL FIXATION (ORIF) LEFT ANKLE FRACTURE (Left)  Patient Location: PACU  Anesthesia Type:GA combined with regional for post-op pain  Level of Consciousness: awake  Airway & Oxygen Therapy: Patient Spontanous Breathing and Patient connected to nasal cannula oxygen  Post-op Assessment: Report given to RN, Post -op Vital signs reviewed and stable and Patient moving all extremities  Post vital signs: Reviewed and stable  Last Vitals:  Filed Vitals:   12/27/14 1415  BP:   Pulse: 97  Temp:   Resp:     Complications: No apparent anesthesia complications

## 2014-12-27 NOTE — Anesthesia Preprocedure Evaluation (Signed)
Anesthesia Evaluation  Patient identified by MRN, date of birth, ID band Patient awake    Reviewed: Allergy & Precautions, NPO status , Patient's Chart, lab work & pertinent test results  Airway Mallampati: I  TM Distance: >3 FB     Dental   Pulmonary    Pulmonary exam normal       Cardiovascular Rhythm:Regular Rate:Normal     Neuro/Psych  Headaches,    GI/Hepatic   Endo/Other    Renal/GU      Musculoskeletal   Abdominal   Peds  Hematology   Anesthesia Other Findings   Reproductive/Obstetrics                             Anesthesia Physical Anesthesia Plan  ASA: I  Anesthesia Plan: General   Post-op Pain Management:    Induction: Intravenous  Airway Management Planned: LMA and Oral ETT  Additional Equipment:   Intra-op Plan:   Post-operative Plan: Extubation in OR  Informed Consent: I have reviewed the patients History and Physical, chart, labs and discussed the procedure including the risks, benefits and alternatives for the proposed anesthesia with the patient or authorized representative who has indicated his/her understanding and acceptance.     Plan Discussed with: CRNA, Anesthesiologist and Surgeon  Anesthesia Plan Comments:         Anesthesia Quick Evaluation

## 2014-12-28 DIAGNOSIS — S82852A Displaced trimalleolar fracture of left lower leg, initial encounter for closed fracture: Secondary | ICD-10-CM | POA: Diagnosis not present

## 2014-12-28 MED ORDER — ASPIRIN EC 81 MG PO TBEC
81.0000 mg | DELAYED_RELEASE_TABLET | Freq: Two times a day (BID) | ORAL | Status: DC
  Administered 2014-12-28 – 2014-12-29 (×2): 81 mg via ORAL
  Filled 2014-12-28 (×3): qty 1

## 2014-12-28 NOTE — Progress Notes (Signed)
Report given to Brittany RN

## 2014-12-28 NOTE — Progress Notes (Addendum)
Pt was unable to void after several attempts to use bedpan and was having significant abdominal pain. Bladder scan at 10:30 pm showed 900 ccs. In and out cath done at 11 pm returned 1200 ccs urine, pt reported relief of abdominal discomfort. PO intake encouraged and pt receiving IVFs. At 5 am pt DTV, [facility], but still unsuccessful. Bladder scan showed 540 ccs at 6:30 am. In and out cath returned 500 ccs urine. Will continue to monitor.   Lowella DellHudson, Yonas Bunda G 12/28/2014 7:53 AM

## 2014-12-28 NOTE — Progress Notes (Signed)
Pt inquiring when she was due for pain medication. Next administration times for PO and IV pain medications were written on the  white board in the pt's room. Pt wanted to know why IV pain medication was due in two hours, pt was educated on administration times.  Pt advised RN could notified MD if pain was not controlled. Call bell within reach, bed in lowest position, will continue to monitor closely.

## 2014-12-28 NOTE — Progress Notes (Signed)
Occupational Therapy Evaluation Patient Details Name: Lisa GrahamKarima Le MRN: [id number] DOB: 04/24/1981 Today's Date: 12/28/2014    History of Present Illness s/p Open reduction and internal fixation of trimalleolar left   Clinical Impression   Pt admitted with the above diagnoses and presents with below problem list. Pt will benefit from continued acute OT to address the below listed deficits and maximize independence with BADLs prior to d/c home. PTA pt was independent with ADLs. Pt is currently min guard to min A for LB ADLs. Pt limited by dizziness once OOB this session. ADL education provided and home setup discussed with pt and sister. OT to continue to follow acutely.     Follow Up Recommendations  Supervision/Assistance OOB/Mobility ;No OT follow up    Equipment Recommendations  Tub/shower bench    Recommendations for Other Services       Precautions / Restrictions Restrictions Weight Bearing Restrictions: Yes LLE Weight Bearing: Non weight bearing      Mobility Bed Mobility Overal bed mobility: Needs Assistance Bed Mobility: Supine to Sit     Supine to sit: Min guard     General bed mobility comments: HOB flat. Dizziness once EOB. Extra effort and time but no physical assist.  Transfers Overall transfer level: Needs assistance Equipment used: Rolling walker (2 wheeled) Transfers: Sit to/from [employer]Stand;Stand Pivot Transfers Sit to Stand: Min guard;Min assist Stand pivot transfers: Min guard;Min assist       General transfer comment: Light Min A for balance upon initial stand from EOB.     Balance Overall balance assessment: Needs assistance Sitting-balance support: Feet supported Sitting balance-Leahy Scale: Good     Standing balance support: Bilateral upper extremity supported;During functional activity Standing balance-Leahy Scale: Poor Standing balance comment: needs rw for NWB LLE                            ADL Overall ADL's : Needs  assistance/impaired Eating/Feeding: Set up;Sitting   Grooming: Set up;Sitting   Upper Body Bathing: Set up;Sitting   Lower Body Bathing: Sit to/from stand;Sitting/lateral leans;Minimal assistance   Upper Body Dressing : Set up;Sitting   Lower Body Dressing: Sit to/from stand;Minimal assistance   Toilet Transfer: Stand-pivot;BSC;RW;Minimal assistance   Toileting- Clothing Manipulation and Hygiene: Minimal assistance;Sit to/from stand   Tub/ Shower Transfer: Minimal assistance;Ambulation;Tub bench;Rolling walker   Functional mobility during ADLs: Min guard;Rolling walker General ADL Comments: Pt completed bed mobility as detailed below. Dizziness noted once upright EOB. After rest break [facility] Pt then trialed knee walker in the room. Recliner then brought to pt and she completed stand>sit to recliner. Educated on compensatory straegies for LB ADLs and home setup while maintaining NWB LLE status. Sister present for session. Discussed AE as well.      Vision     Perception     Praxis      Pertinent Vitals/Pain Pain Assessment: 0-10 Pain Score: 3  Pain Location: LLE Pain Descriptors / Indicators: Aching;Sore Pain Intervention(s): Limited activity within patient's tolerance;Monitored during session;Premedicated before session;Repositioned;Utilized relaxation techniques     Hand Dominance     Extremity/Trunk Assessment Upper Extremity Assessment Upper Extremity Assessment: Overall WFL for tasks assessed   Lower Extremity Assessment Lower Extremity Assessment: Defer to PT evaluation   Cervical / Trunk Assessment Cervical / Trunk Assessment: Normal   Communication Communication Communication: No difficulties   Cognition Arousal/Alertness: Awake/alert Behavior During Therapy: WFL for tasks assessed/performed Overall Cognitive Status: Within Functional  Limits for tasks assessed                     General Comments       Exercises        Shoulder Instructions      Home Living Family/patient expects to be discharged to:: Private residence Living Arrangements: Other relatives;Other (Comment) (plans to d/c to sister's house) Available Help at Discharge: Family;Available PRN/intermittently;Other (Comment) (sister works a few hours in the mornings) Type of Home: House Home Access: Stairs to enter [employer] of Steps: 2 Entrance Stairs-Rails: None Home Layout: One level     Bathroom Shower/Tub: Chief Strategy Officer: Standard     Home Equipment: Crutches          Prior Functioning/Environment Level of Independence: Independent        Comments: [employer] employee    OT Diagnosis: Acute pain   OT Problem List: Decreased activity tolerance;Impaired balance (sitting and/or standing);Decreased knowledge of use of DME or AE;Decreased knowledge of precautions;Pain   OT Treatment/Interventions: Self-care/ADL training;Energy conservation;DME and/or AE instruction;Therapeutic activities;Patient/family education;Balance training    OT Goals(Current goals can be found in the care plan section) Acute Rehab OT Goals Patient Stated Goal: not stated OT Goal Formulation: With patient/family Time For Goal Achievement: 01/04/15 Potential to Achieve Goals: Good ADL Goals Pt Will Perform Lower Body Bathing: with modified independence;with adaptive equipment;sit to/from stand Pt Will Perform Lower Body Dressing: with modified independence;with adaptive equipment;sit to/from stand Pt Will Transfer to Toilet: with modified independence;ambulating;regular height toilet;grab bars Pt Will Perform Toileting - Clothing Manipulation and hygiene: with modified independence;sit to/from stand Pt Will Perform Tub/Shower Transfer: Tub transfer;ambulating;tub bench;rolling walker  OT Frequency: Min 2X/week   Barriers to D/C:            Co-evaluation PT/OT/SLP Co-Evaluation/Treatment: Yes Reason for Co-Treatment: For  patient/therapist safety   OT goals addressed during session: ADL's and self-care      End of Session Equipment Utilized During Treatment: Gait belt;Rolling walker;Other (comment) (trialed knee walker) Nurse Communication: Mobility status  Activity Tolerance: Other (comment) (dizziness/nausea) Patient left: in chair;with call bell/phone within reach;with family/visitor present   Time: 1610-9604 OT Time Calculation (min): 40 min Charges:  OT General Charges $OT Visit: 1 Procedure OT Evaluation $Initial OT Evaluation Tier I: 1 Procedure G-Codes: OT G-codes **NOT FOR INPATIENT CLASS** Functional Assessment Tool Used: clinical judgement Functional Limitation: Self care Self Care Current Status (V4098): At least 1 percent but less than 20 percent impaired, limited or restricted Self Care Goal Status (J1914): At least 1 percent but less than 20 percent impaired, limited or restricted  Pilar Grammes 12/28/2014, 2:49 PM

## 2014-12-28 NOTE — Evaluation (Signed)
Physical Therapy Evaluation Patient Details Name: Lisa Le MRN: [id number] DOB: 07/12/1981 Today's Date: 12/28/2014   History of Present Illness  s/p ORIF left ankle. PMH of heart murmur and HA.  Clinical Impression  Patient presents with pain, dizziness, nausea and balance deficits s/p above surgery impacting mobility. Discussed putting temporary ramp up to avoid having to negotiate steps to enter home. Pt has been using crutches for ambulation PTA however used RW for mobility today. Will need to reassess if crutches are the safest option. Plan to improve ambulation distance tomorrow so pt can get to bathroom/bedroom at home safely. Attempted to instruct pt to use knee walker per her request, however not a safe option at this time. Would benefit from skilled PT to improve transfers, gait, balance and mobility so pt can maximize independence and minimize fall risk prior to return home.    Follow Up Recommendations Home health PT;Supervision/Assistance - 24 hour    Equipment Recommendations  Wheelchair (measurements PT);Wheelchair cushion (measurements PT);Rolling walker with 5" wheels    Recommendations for Other Services       Precautions / Restrictions Precautions Precautions: Fall Restrictions Weight Bearing Restrictions: Yes LLE Weight Bearing: Non weight bearing      Mobility  Bed Mobility Overal bed mobility: Needs Assistance Bed Mobility: Supine to Sit     Supine to sit: Min guard     General bed mobility comments: HOB flat. Dizziness once EOB. Extra effort and time but no physical assist.   Transfers Overall transfer level: Needs assistance Equipment used: Rolling walker (2 wheeled) Transfers: Sit to/from [employer];Stand Pivot Transfers Sit to Stand: Min guard;Min assist Stand pivot transfers: Min assist       General transfer comment: Light Min A for balance upon initial stand from E[facility] x1, and [facility]. Cues for hand placement. Compliant with NWB  LLE.  Ambulation/Gait Ambulation/Gait assistance: Min assist Ambulation Distance (Feet): 7 Feet Assistive device: Rolling walker (2 wheeled) Gait Pattern/deviations: Step-to pattern;Trunk flexed   Gait velocity interpretation: Below normal speed for age/gender General Gait Details: Hop to gait pattern with increased WB through BUEs. Fatigues easily. + dizziness upon standing. + nausea. Attempted knee walker however not deemed safe at this time.  Stairs            Wheelchair Mobility    Modified Rankin (Stroke Patients Only)       Balance Overall balance assessment: Needs assistance Sitting-balance support: Feet supported;No upper extremity supported Sitting balance-Leahy Scale: Good     Standing balance support: During functional activity Standing balance-Leahy Scale: Poor Standing balance comment: Relient on RW for balance and NWB LLE.                             Pertinent Vitals/Pain Pain Assessment: 0-10 Pain Score: 3  Pain Location: LLE Pain Descriptors / Indicators: Sore;Aching Pain Intervention(s): Limited activity within patient's tolerance;Monitored during session;Premedicated before session;Repositioned    Home Living Family/patient expects to be discharged to:: Private residence Living Arrangements: Other relatives;Other (Comment) Available Help at Discharge: Family;Available PRN/intermittently;Other (Comment) Type of Home: House Home Access: Stairs to enter Entrance Stairs-Rails: None Entrance Stairs-Number of Steps: 2 Home Layout: One level Home Equipment: Crutches      Prior Function Level of Independence: Independent with assistive device(s)         Comments: [employer] employee; using crutches for the last week since ankle fx.      Hand Dominance  Extremity/Trunk Assessment   Upper Extremity Assessment: Defer to OT evaluation           Lower Extremity Assessment: Generalized weakness;LLE deficits/detail   LLE  Deficits / Details: Swelling noted in toes. Able to perform SLR.  Cervical / Trunk Assessment: Normal  Communication   Communication: No difficulties  Cognition Arousal/Alertness: Awake/alert Behavior During Therapy: WFL for tasks assessed/performed Overall Cognitive Status: Within Functional Limits for tasks assessed                      General Comments General comments (skin integrity, edema, etc.): Discussed safe mobility at home and minimizing fall risk.    Exercises General Exercises - Lower Extremity Ankle Circles/Pumps: Both;10 reps;Supine Quad Sets: 5 reps;Left;Seated Gluteal Sets: Both;10 reps;Seated      Assessment/Plan    PT Assessment Patient needs continued PT services  PT Diagnosis Acute pain;Generalized weakness;Difficulty walking   PT Problem List Decreased strength;Pain;Decreased activity tolerance;Decreased balance;Decreased mobility  PT Treatment Interventions Balance training;Gait training;Stair training;Functional mobility training;Therapeutic activities;Therapeutic exercise;Wheelchair mobility training;Patient/family education;DME instruction   PT Goals (Current goals can be found in the Care Plan section) Acute Rehab PT Goals Patient Stated Goal: none stated PT Goal Formulation: With patient Time For Goal Achievement: 01/11/15 Potential to Achieve Goals: Fair    Frequency Min 3X/week   Barriers to discharge Inaccessible home environment Pt has to negotiate 2 steps to enter home - recommending ramp    Co-evaluation PT/OT/SLP Co-Evaluation/Treatment: Yes Reason for Co-Treatment: For patient/therapist safety (high pain level) PT goals addressed during session: Mobility/safety with mobility;Proper use of DME;Strengthening/ROM;Balance OT goals addressed during session: ADL's and self-care       End of Session Equipment Utilized During Treatment: Gait belt Activity Tolerance: Patient tolerated treatment well;Patient limited by pain;Other  (comment) (nausea, dizziness.) Patient left: in chair;with call bell/phone within reach;with family/visitor present Nurse Communication: Mobility status    Functional Assessment Tool Used: Clinical judgment Functional Limitation: Mobility: Walking and moving around Mobility: Walking and Moving Around Current Status (Z6109): At least 20 percent but less than 40 percent impaired, limited or restricted Mobility: Walking and Moving Around Goal Status [phone]): At least 1 percent but less than 20 percent impaired, limited or restricted    Time: 1344-1423 PT Time Calculation (min) (ACUTE ONLY): 39 min   Charges:   PT Evaluation $Initial PT Evaluation Tier I: 1 Procedure PT Treatments $Therapeutic Activity: 8-22 mins   PT G Codes:   PT G-Codes **NOT FOR INPATIENT CLASS** Functional Assessment Tool Used: Clinical judgment Functional Limitation: Mobility: Walking and moving around Mobility: Walking and Moving Around Current Status (U9811): At least 20 percent but less than 40 percent impaired, limited or restricted Mobility: Walking and Moving Around Goal Status [phone]): At least 1 percent but less than 20 percent impaired, limited or restricted    Brigitt Mcclish A Breeann Reposa 12/28/2014, 3:42 PM  Mylo Red, PT, DPT [phone]

## 2014-12-28 NOTE — Progress Notes (Signed)
Patient ID: [name], female   DOB: 01/16/1981, 34 y.o.   MRN: [id number] Uncomfortable this am.  Having trouble urinating.  Did well with surgery late yesterday.  Will need to start mobilizing with therapy today.  Will be non-weight bearing on her left ankle for 4-6 weeks.  I'll need to keep her today for mobility purposes and discharge tomorrow.

## 2014-12-28 NOTE — Progress Notes (Signed)
Bladder scan showed 510 cc. Patient now has foley catheter.

## 2014-12-28 NOTE — Anesthesia Postprocedure Evaluation (Signed)
  Anesthesia Post-op Note  Patient: Lisa Le  Procedure(s) Performed: Procedure(s): OPEN REDUCTION INTERNAL FIXATION (ORIF) LEFT ANKLE FRACTURE (Left)  Patient Location: PACU  Anesthesia Type:GA combined with regional for post-op pain  Level of Consciousness: awake, alert , oriented and patient cooperative  Airway and Oxygen Therapy: Patient Spontanous Breathing  Post-op Pain: mild  Post-op Assessment: Post-op Vital signs reviewed, Patient's Cardiovascular Status Stable, Respiratory Function Stable, Patent Airway, No signs of Nausea or vomiting and Pain level controlled  Post-op Vital Signs: stable  Last Vitals:  Filed Vitals:   12/28/14 0613  BP: 106/64  Pulse: 114  Temp: 37.5 C  Resp: 17    Complications: No apparent anesthesia complications

## 2014-12-28 NOTE — Progress Notes (Signed)
Pt called requesting pain medication twice in five minute time span. RN Community education officernotified Charge RN of pt's request, due to RN was assisting another pt. RN called pt's room to advise that the charge RN was en route with pain medication.

## 2014-12-28 NOTE — Op Note (Signed)
NAMLeland Her:  Lisa Le, Lisa Le             ACCOUNT NO.:  [id number]  MEDICAL RECORD NO.:  [id number]  LOCATION:  [id number]                        FACILITY:  [facility]  PHYSICIAN:  Vanita PandaChristopher Y. Magnus IvanBlackman, M.D.DATE OF BIRTH:  Jul 08, 1981  DATE OF PROCEDURE:  12/27/2014 DATE OF DISCHARGE:                              OPERATIVE REPORT   PREOPERATIVE DIAGNOSIS:  Left trimalleolar ankle fracture dislocation status post closed reduction and splinting by the ER staff.  POSTOPERATIVE DIAGNOSIS:  Left trimalleolar ankle fracture dislocation status post closed reduction and splinting by the ER staff.  PROCEDURE:  Open reduction and internal fixation of trimalleolar left ankle fracture with fixation of the lateral and medial malleolus only.  IMPLANTS:  Synthes 7 hole 1/3rd semi-tubular plate and lag screw laterally and two partially-threaded 4.0 cancellous cannulated screws medially.  SURGEON:  Vanita PandaChristopher Y. Magnus IvanBlackman, M.D.  ASSISTANT:  Richardean CanalGilbert Clark, PA-C.  ANESTHESIA: 1. Left leg popliteal block. 2. General.  TOURNIQUET TIME:  Just over 1 hour.  COMPLICATIONS:  None.  INDICATIONS:  Lisa Le is a 34 year old employed at the [employer], who sustained a mechanical fall at work last Tuesday.  She suffered a closed trimalleolar left ankle fracture dislocation and had to have a closed reduction and splinting performed in the emergency room.  We elected to wait a week for surgery due to the soft tissue swelling because of the significant trauma to her ankle.  Now, she presents for surgical definitive fixation of the ankle fracture.  It appears the posterior malleolar pieces much less than 20% in the ankle joint surface, so this will be treated closed.  The lateral medial side though need fixation. I explained the risks and benefits of surgery to her including the risk of acute blood loss anemia, nerve and vessel injury, infection, DVT, nonunion, and posttraumatic arthritis.  PROCEDURE DESCRIPTION:   After informed consent was obtained, appropriate left leg was marked.  Anesthesia obtained a popliteal block.  She was then brought to the operating room, placed supine on the operating table.  General anesthesia was then obtained.  A bump was placed under her left hip as well as a nonsterile tourniquet placed around her upper left thigh.  Her left leg was then prepped and draped from the knee to the toes with DuraPrep and sterile drapes.  Time-out was called and she was identified as correct patient and correct left ankle.  We then used Esmarch to wrap of the ankle and tourniquet was inflated to 300 mm of pressure.  I then made incision directly over the fibula and carried this proximally and distally.  We were able to dissect down the fracture and found a highly comminuted fibular fracture.  We were able to reduce the main fragments using reduction forceps, and this improved the ankle mortise and alignment.  We then placed a single 3.5 mm lag screw from anterior to posterior and then a 1/3rd semi-tubular plate, which was a 7- hole plate from Synthes along the lateral cortex of the fibula.  We secured these with bicortical screws proximally and 2 cancellous screws distally.  I stressed the ankle mortise and the syndesmosis was intact. Then, I went to the medial side and made several medial incisions.  We found a big medial malleolus piece.  We were able to expose the medial joint and irrigated out the joint completely and I found no significant cartilage disruption.  We then held that medial piece with reduction forceps and placed 250 mm in length 4.08 partially-threaded cancellous screws to secure the fracture on this site.  With AP, mortise, and lateral planes, we stressed the ankle under fluoroscopy and the ankle mortise was reduced.  We elected not to fix the posterior piece.  We then cleaned both wounds with normal saline solution, closed the deep tissue with 0 Vicryl followed by 2-0  Vicryl in the subcutaneous tissue, and interrupted 2-0 nylon on the skin.  Xeroform and a well-padded sterile dressing was applied followed by a well-padded plaster splint. The tourniquet was let down.  Her toes were pinked nicely.  She was awakened, extubated, and taken to recovery room in stable condition. All final counts were correct.  There were no complications noted.  Of note, Richardean Canal, PA-C., assisted in the  entire case, and his assistance was crucial particularly holding the fracture reduced while we made our definitive fixation.  He was also performed a layered closure of the wound and splinting.     Vanita Panda. Magnus Ivan, M.D.     CYB/MEDQ  D:  12/27/2014  T:  12/28/2014  Job:  [id number]

## 2014-12-29 ENCOUNTER — Encounter (HOSPITAL_COMMUNITY): Payer: Self-pay | Admitting: Orthopaedic Surgery

## 2014-12-29 DIAGNOSIS — S82852A Displaced trimalleolar fracture of left lower leg, initial encounter for closed fracture: Secondary | ICD-10-CM | POA: Diagnosis not present

## 2014-12-29 MED ORDER — ONDANSETRON 4 MG PO TBDP: 4.0000 mg | ORAL_TABLET | Freq: Three times a day (TID) | ORAL | Status: DC | PRN

## 2014-12-29 MED ORDER — METHOCARBAMOL 500 MG PO TABS: 500.0000 mg | ORAL_TABLET | Freq: Four times a day (QID) | ORAL | Status: DC | PRN

## 2014-12-29 MED ORDER — POLYETHYLENE GLYCOL 3350 17 G PO PACK
17.0000 g | PACK | Freq: Every day | ORAL | Status: DC
  Administered 2014-12-29: 17 g via ORAL

## 2014-12-29 MED ORDER — ASPIRIN EC 325 MG PO TBEC: 325.0000 mg | DELAYED_RELEASE_TABLET | Freq: Two times a day (BID) | ORAL | Status: DC

## 2014-12-29 MED ORDER — OXYCODONE-ACETAMINOPHEN 5-325 MG PO TABS: 2.0000 | ORAL_TABLET | ORAL | Status: DC | PRN

## 2014-12-29 NOTE — Progress Notes (Signed)
Physical Therapy Treatment Patient Details Name: Lisa GrahamKarima Le MRN: [id number] DOB: 05/04/1981 Today's Date: 12/29/2014    History of Present Illness s/p ORIF left ankle. PMH of heart murmur and HA.    PT Comments    Patient progressing well towards PT goals. Improved ambulation distance and endurance today. No dizziness or nausea present. Instructed pt in HEP, LLE positioning/activity at home and using ramp to enter house. Pt will have 24/7 S from supportive sister at home. Pt to be discharged later today if able to void. Will continue to follow if still in hospital to maximize independence and safe mobility.   Follow Up Recommendations  Home health PT;Supervision/Assistance - 24 hour     Equipment Recommendations  Wheelchair (measurements PT);Wheelchair cushion (measurements PT);Rolling walker with 5" wheels    Recommendations for Other Services       Precautions / Restrictions Precautions Precautions: Fall Restrictions Weight Bearing Restrictions: Yes LLE Weight Bearing: Non weight bearing    Mobility  Bed Mobility Overal bed mobility: Needs Assistance Bed Mobility: Supine to Sit     Supine to sit: Min assist     General bed mobility comments: Sitting in chair upon PT arrival.   Transfers Overall transfer level: Needs assistance Equipment used: Rolling walker (2 wheeled) Transfers: Sit to/from Stand Sit to Stand: Min guard         General transfer comment: Min guard for safety. Stood from chair x3. Cues for hand placement. Compliant with WB status.  Ambulation/Gait Ambulation/Gait assistance: Min guard Ambulation Distance (Feet): 20 Feet (x3 bouts) Assistive device: Rolling walker (2 wheeled) Gait Pattern/deviations: Step-to pattern;Trunk flexed   Gait velocity interpretation: Below normal speed for age/gender General Gait Details: Hop to gait pattern with increased WB through BUEs. Cues for RW safety. Seated rest breaks between bouts.   Stairs             Wheelchair Mobility    Modified Rankin (Stroke Patients Only)       Balance Overall balance assessment: Needs assistance Sitting-balance support: Feet supported;No upper extremity supported Sitting balance-Leahy Scale: Good     Standing balance support: During functional activity Standing balance-Leahy Scale: Poor                      Cognition Arousal/Alertness: Awake/alert Behavior During Therapy: WFL for tasks assessed/performed Overall Cognitive Status: Within Functional Limits for tasks assessed                      Exercises General Exercises - Lower Extremity Quad Sets: Both;10 reps;Seated Long Arc Quad: Left;10 reps;Seated Hip Flexion/Marching: Left;10 reps;Seated    General Comments General comments (skin integrity, edema, etc.): Lengthy discussion with pt and pt's sister about putting ramp up to enter home. Will go to [employer] in PM to put a temporary one in.       Pertinent Vitals/Pain Pain Assessment: Faces Pain Score: 7  Faces Pain Scale: Hurts little more Pain Location: LLE Pain Descriptors / Indicators: Sore;Aching;Throbbing Pain Intervention(s): Monitored during session;Premedicated before session;Repositioned    Home Living                      Prior Function            PT Goals (current goals can now be found in the care plan section) Progress towards PT goals: Progressing toward goals    Frequency  Min 3X/week    PT Plan Current plan remains appropriate  Co-evaluation             End of Session Equipment Utilized During Treatment: Gait belt Activity Tolerance: Patient tolerated treatment well Patient left: in chair;with call bell/phone within reach;with family/visitor present     Time: 1020-1045 PT Time Calculation (min) (ACUTE ONLY): 25 min  Charges:  $Gait Training: 8-22 mins $Therapeutic Exercise: 8-22 mins                    G Codes:      [name] A [name] 12/29/2014, 11:11  AM Mylo Red, PT, DPT [phone]

## 2014-12-29 NOTE — Discharge Summary (Signed)
Patient ID: Lisa Le MRN: [id number] DOB/AGE: 1980-09-30 34 y.o.  Admit date: 12/27/2014 Discharge date: 12/29/2014  Admission Diagnoses:  Principal Problem:   Trimalleolar fracture of left ankle Active Problems:   Trimalleolar fracture of ankle, closed   Discharge Diagnoses:  Same  Past Medical History  Diagnosis Date  . Heart palpitations 2012  . Headache   . Constipation     Surgeries: Procedure(s): OPEN REDUCTION INTERNAL FIXATION (ORIF) LEFT ANKLE FRACTURE on 12/27/2014   Consultants:    Discharged Condition: Improved  Hospital Course: Lisa Le is an 34 y.o. female who was admitted 12/27/2014 for operative treatment ofTrimalleolar fracture of left ankle. Patient has severe unremitting pain that affects sleep, daily activities, and work/hobbies. After pre-op clearance the patient was taken to the operating room on 12/27/2014 and underwent  Procedure(s): OPEN REDUCTION INTERNAL FIXATION (ORIF) LEFT ANKLE FRACTURE.    Patient was given perioperative antibiotics: Anti-infectives    Start     Dose/Rate Route Frequency Ordered Stop   12/27/14 2000  ceFAZolin (ANCEF) IVPB 1 g/50 mL premix     1 g 100 mL/hr over 30 Minutes Intravenous Every 6 hours 12/27/14 1857 12/28/14 1007   12/27/14 1445  ceFAZolin (ANCEF) 3 g in dextrose 5 % 50 mL IVPB     3 g 160 mL/hr over 30 Minutes Intravenous To [facility] 12/26/14 1154 12/27/14 1532       Patient was given sequential compression devices, early ambulation, and chemoprophylaxis to prevent DVT.  Patient benefited maximally from hospital stay and there were no complications.    Recent vital signs: Patient Vitals for the past 24 hrs:  BP Temp Temp src Pulse Resp SpO2  12/29/14 0538 118/74 mmHg 98.5 F (36.9 C) Oral (!) 113 15 94 %  12/28/14 2109 122/82 mmHg 99 F (37.2 C) Oral (!) 116 16 98 %  12/28/14 1137 - 99.5 F (37.5 C) Oral - - -     Recent laboratory studies: No results for input(s): WBC, HGB,  HCT, PLT, NA, K, CL, CO2, BUN, CREATININE, GLUCOSE, INR, CALCIUM in the last 72 hours.  Invalid input(s): PT, 2   Discharge Medications:     Medication List    STOP taking these medications        ibuprofen 200 MG tablet  Commonly known as:  ADVIL,MOTRIN     promethazine 12.5 MG tablet  Commonly known as:  PHENERGAN      TAKE these medications        aspirin EC 325 MG tablet  Take 1 tablet (325 mg total) by mouth 2 (two) times daily after a meal.     methocarbamol 500 MG tablet  Commonly known as:  ROBAXIN  Take 1 tablet (500 mg total) by mouth every 6 (six) hours as needed for muscle spasms.     ondansetron 4 MG disintegrating tablet  Commonly known as:  ZOFRAN ODT  Take 1 tablet (4 mg total) by mouth every 8 (eight) hours as needed for nausea or vomiting.     oxyCODONE-acetaminophen 5-325 MG per tablet  Commonly known as:  PERCOCET/ROXICET  Take 2 tablets by mouth every 4 (four) hours as needed for severe pain.        Diagnostic Studies: Dg Ankle 2 Views Left  12/20/2014   CLINICAL DATA:  Left ankle fracture postreduction.  EXAM: LEFT ANKLE - 2 VIEW  COMPARISON:  Pre reduction views earlier this date  FINDINGS: Improved alignment of trimalleolar fracture post reduction with minimal residual persistent  displacement. Improved alignment of the ankle mortise. Overlying cast material is in place, limiting osseous and soft tissue fine detail.  IMPRESSION: Improved alignment of trimalleolar fracture post reduction.   Electronically Signed   By: Rubye OaksMelanie  Ehinger M.D.   On: 12/20/2014 21:12   Dg Ankle Complete Left  12/27/2014   CLINICAL DATA:  ORIF left ankle fracture.  EXAM: DG C-ARM 61-120 MIN; LEFT ANKLE COMPLETE - 3+ VIEW  FLUOROSCOPY TIME:  Radiation Exposure Index (as provided by the fluoroscopic device):  If the device does not provide the exposure index:  Fluoroscopy Time (in minutes and seconds):  1 minutes 2 seconds  Number of Acquired Images:  3  COMPARISON:  12/20/2014   FINDINGS: There is been fixation patient's left ankle fracture dislocation. There are 2 orthopedic screws extending from inferior to superior fixating the medial malleolar fracture which are intact with anatomic alignment about the fracture site. There is a lateral fixation plate with screws bridging patient's distal fibular fracture with hardware intact and anatomic alignment about the fracture site. Ankle mortise is normal. Persistent minimal displacement of the posterior malleolar fracture.  IMPRESSION: Reduction and internal fixation of patient's left ankle fracture dislocation with hardware intact and anatomic alignment about the fibular and medial malleolar fracture sites. Mild persistent displacement of the posterior malleolar fracture.   Electronically Signed   By: Elberta Fortisaniel  Boyle M.D.   On: 12/27/2014 17:10   Dg Ankle Complete Left  12/20/2014   CLINICAL DATA:  Status post fall, with diffuse left ankle pain. Initial encounter.  EXAM: LEFT ANKLE COMPLETE - 3+ VIEW  COMPARISON:  None.  FINDINGS: There is a trimalleolar fracture, with lateral dislocation and rotation of the talus with regard to the tibial plafond. The fracture fragments are significantly laterally and posteriorly displaced, with mild comminution at the distal fibular fracture and posterior malleolar fracture. Surrounding soft tissue swelling is noted. Involvement of the medial aspect of the distal tibia cannot be excluded, but is not definitely seen.  The subtalar joint is grossly unremarkable, though difficult to fully assess. No additional fractures are seen.  IMPRESSION: Trimalleolar fracture, with lateral dislocation and rotation of the talus with regard to the tibial plafond. Fracture fragments are significantly laterally and posteriorly displaced, with mild comminution at the distal fibular fracture and posterior malleolar fracture.   Electronically Signed   By: Roanna RaiderJeffery  Chang M.D.   On: 12/20/2014 19:14   Dg C-arm 1-60  Min  12/27/2014   CLINICAL DATA:  ORIF left ankle fracture.  EXAM: DG C-ARM 61-120 MIN; LEFT ANKLE COMPLETE - 3+ VIEW  FLUOROSCOPY TIME:  Radiation Exposure Index (as provided by the fluoroscopic device):  If the device does not provide the exposure index:  Fluoroscopy Time (in minutes and seconds):  1 minutes 2 seconds  Number of Acquired Images:  3  COMPARISON:  12/20/2014  FINDINGS: There is been fixation patient's left ankle fracture dislocation. There are 2 orthopedic screws extending from inferior to superior fixating the medial malleolar fracture which are intact with anatomic alignment about the fracture site. There is a lateral fixation plate with screws bridging patient's distal fibular fracture with hardware intact and anatomic alignment about the fracture site. Ankle mortise is normal. Persistent minimal displacement of the posterior malleolar fracture.  IMPRESSION: Reduction and internal fixation of patient's left ankle fracture dislocation with hardware intact and anatomic alignment about the fibular and medial malleolar fracture sites. Mild persistent displacement of the posterior malleolar fracture.   Electronically Signed  By: Elberta Fortis M.D.   On: 12/27/2014 17:10    Disposition: 01-Home or Self Care      Discharge Instructions    Call MD / Call 911    Complete by:  As directed   If you experience chest pain or shortness of breath, CALL 911 and be transported to the hospital emergency room.  If you develope a fever above 101 F, pus (white drainage) or increased drainage or redness at the wound, or calf pain, call your surgeon's office.     Constipation Prevention    Complete by:  As directed   Drink plenty of fluids.  Prune juice may be helpful.  You may use a stool softener, such as Colace (over the counter) 100 mg twice a day.  Use MiraLax (over the counter) for constipation as needed.     Diet - low sodium heart healthy    Complete by:  As directed      Discharge patient     Complete by:  As directed      Increase activity slowly as tolerated    Complete by:  As directed            Follow-up Information    Follow up with Kathryne Hitch, MD In 2 weeks.   Specialty:  Orthopedic Surgery   Contact information:   [address] [place] [address] [phone]        Signed: Kathryne Hitch 12/29/2014, 7:28 AM

## 2014-12-29 NOTE — Progress Notes (Addendum)
Occupational Therapy Treatment Patient Details Name: Lisa GrahamKarima Le MRN: [id number] DOB: 10/31/1980 Today's Date: 12/29/2014    History of present illness s/p ORIF left ankle. PMH of heart murmur and HA.   OT comments  Pt with some dizziness with activity but able to transfer into bathroom to practice on 3in1 and back out to chair. Not able to void during session. Educated on AE options, safety with functional transfers and Statisticianclothing management/dressing and DME use. Pt doing well and sister very supportive and hands on with care. Encouraged rest breaks and PLB as pt does fatigue with activity.   Follow Up Recommendations  Supervision/Assistance - 24 hour;No OT follow up    Equipment Recommendations  Tub/shower bench;3 in 1 bedside comode    Recommendations for Other Services      Precautions / Restrictions Precautions Precautions: Fall Restrictions Weight Bearing Restrictions: Yes LLE Weight Bearing: Non weight bearing       Mobility Bed Mobility Overal bed mobility: Needs Assistance Bed Mobility: Supine to Sit     Supine to sit: Min assist     General bed mobility comments: min assist to support L Le due to pain.  Transfers Overall transfer level: Needs assistance Equipment used: Rolling walker (2 wheeled) Transfers: Sit to/from Stand Sit to Stand: Min guard         General transfer comment: verbal cues for hand placement. Did well with NWB compliance L Le. min assist to descend to 3in1.    Balance                                   ADL                           Toilet Transfer: Minimal assistance;Ambulation;BSC;RW   Toileting- Clothing Manipulation and Hygiene: Minimal assistance;Sit to/from stand         General ADL Comments: Sister present and very hands on and supportive. Educated on 3in1 and feel this will be safer for pt right now than standard commode. Pt agrees and would like a 3in1. She performed toilet hygiene in  standing with min to min guard assist for balance. Discussed where sister should stand to provide balance support PRN and option of using a belt for hold. Demonstrated tub bench transfer and how to adjust 3in1 and bench to appropriate height. They verbalize understanding of tub bench transfer. Pt declines AE use right now and states sister will assist with LB dressing but verbalizes where to obtain AE if changes mind. Pt reports slight dizziness with activity today.      Vision                     Perception     Praxis      Cognition   Behavior During Therapy: WFL for tasks assessed/performed Overall Cognitive Status: Within Functional Limits for tasks assessed                       Extremity/Trunk Assessment               Exercises     Shoulder Instructions       General Comments      Pertinent Vitals/ Pain       Pain Assessment: 0-10 Pain Score: 7  Pain Location: L Le and 3/10 headache Pain Descriptors / Indicators: Aching Pain  Intervention(s): Patient requesting pain meds-RN notified;Repositioned  Home Living                                          Prior Functioning/Environment              Frequency Min 2X/week     Progress Toward Goals  OT Goals(current goals can now be found in the care plan section)  Progress towards OT goals: Progressing toward goals     Plan Discharge plan remains appropriate    Co-evaluation                 End of Session Equipment Utilized During Treatment: Gait belt;Rolling walker   Activity Tolerance Patient tolerated treatment well   Patient Left in chair;with call bell/phone within reach;with family/visitor present   Nurse Communication          Time: 1027-2536 OT Time Calculation (min): 32 min  Charges: OT General Charges $OT Visit: 1 Procedure OT Treatments $Self Care/Home Management : 8-22 mins $Therapeutic Activity: 8-22 mins  Lisa Le   [phone] 12/29/2014, 9:52 AM

## 2014-12-29 NOTE — Discharge Instructions (Signed)
Non-weight bearing on your left ankle until further notice. Elevation for swelling. Keep your splint clean and dry. Do get an over-the-counter stool softener to take twice daily. Do take a full-strength 325 mg aspirin twice daily.

## 2014-12-29 NOTE — Care Management Note (Signed)
Case Management Note  Patient Details  Name: Lisa GrahamKarima Le MRN: [id number] Date of Birth: 03/05/1981  Subjective/Objective:   Trimalleolar left ankle fracture                 Action/Plan:  Patient's injury handled thru [employer], spoke with patient, case manager is [employer] IncSharol Siler [phone]. Left voicemail for Ms Siler,received call back and then she came to visit patient. Gave Ms Siler H and P, op note,d/c summary order for HHPT, rolling walker ,3N1,and tub bench. They will use [facility] for the equipment but will set up HHPT with a contracted agency and let the patient know which agency. Contacted [facility] with [facility] and equipment was delivered to patient's room prior to discharge.      Expected Discharge Date:                  Expected Discharge Plan:  Home w Home Health Services  In-House Referral:     Discharge planning Services  CM Consult  Post Acute Care Choice:  Durable Medical Equipment Choice offered to:     DME Arranged:  Bedside commode, Tub bench, Walker rolling, Wheelchair manual DME Agency:  [facility]  HH Arranged:  PT HH Agency:  Other - See comment  Status of Service:  Completed, signed off  Medicare Important Message Given:    Date Medicare IM Given:    Medicare IM give by:    Date Additional Medicare IM Given:    Additional Medicare Important Message give by:     If discussed at Long Length of Stay Meetings, dates discussed:    Additional Comments:  Monica BectonKrieg, Bobi Daudelin Watson, RN 12/29/2014, 5:09 PM

## 2014-12-29 NOTE — Progress Notes (Signed)
Patient ID: Lisa GrahamKarima Bob, female   DOB: 01/11/1981, 34 y.o.   MRN: [id number] Foley placed yesterday due to urinary retention.  Likely from pain meds, anesthesia effects, and decreased mobility.  Will have foley removed this am and attempt to mobilize with therapy.  Hopefully discharge to home this afternoon.

## 2014-12-29 NOTE — Progress Notes (Signed)
Patient home equipment at bedside.  Patient voided three times post removal of foley.  Patient unable to have bowel movement, but passing flatus and active bowel sounds.  Will continue miralax at home.  Sister at bedside to take patient home. Discharge education given and patient will pick up prescriptions.  Patient's pain currently under control.

## 2015-02-14 ENCOUNTER — Ambulatory Visit (INDEPENDENT_AMBULATORY_CARE_PROVIDER_SITE_OTHER): Payer: Self-pay | Admitting: Physician Assistant

## 2015-02-14 VITALS — BP 121/90 | HR 88 | Temp 98.3°F | Resp 16 | Ht 69.0 in | Wt 270.0 lb

## 2015-02-14 DIAGNOSIS — H60392 Other infective otitis externa, left ear: Secondary | ICD-10-CM

## 2015-02-14 MED ORDER — TRAMADOL HCL 50 MG PO TABS: 50.0000 mg | ORAL_TABLET | Freq: Three times a day (TID) | ORAL | Status: DC | PRN

## 2015-02-14 MED ORDER — CIPROFLOXACIN-HYDROCORTISONE 0.2-1 % OT SUSP: 3.0000 [drp] | Freq: Two times a day (BID) | OTIC | Status: DC

## 2015-02-14 MED ORDER — NEOMYCIN-POLYMYXIN-HC 3.5-10000-1 OT SOLN: 3.0000 [drp] | Freq: Four times a day (QID) | OTIC | Status: DC

## 2015-02-14 NOTE — Progress Notes (Signed)
   Subjective:    Patient ID: Lisa GrahamKarima Iannello, female    DOB: 11/19/1980, 34 y.o.   MRN: [id number]  HPI Patient presents for bilateral ear pain and itching that have been present for 1 week. Had pain 3 months ago, but improved when taking antibiotics for foot. Endorses tinnitus and decreased hearing on left side. Denies fever, congestion, sinus pressure, HA, or cough. Denies recent swimming or being in water. Has used same shampoo to wash hair. Uses Q-tips sometimes. Ibuprofen no longer helping with pain. Med allergy to latex.    Review of Systems As noted above.    Objective:   Physical Exam  Constitutional: She is oriented to person, place, and time. She appears well-developed and well-nourished. No distress.  Blood pressure 121/90, pulse 88, temperature 98.3 F (36.8 C), temperature source Oral, resp. rate 16, height 5\' 9"  (1.753 m), weight 270 lb (122.471 kg), last menstrual period 02/09/2015, SpO2 99 %.  HENT:  Head: Normocephalic and atraumatic.  Right Ear: Tympanic membrane, external ear and ear canal normal. No drainage, swelling or tenderness. No foreign bodies. Tympanic membrane is not injected, not erythematous, not retracted and not bulging. No middle ear effusion. No decreased hearing is noted.  Left Ear: There is swelling and tenderness. No drainage. Decreased hearing is noted.  Nose: No mucosal edema or rhinorrhea.  Mouth/Throat: Uvula is midline and oropharynx is clear and moist.  Eyes: Conjunctivae are normal. Right eye exhibits no discharge. Left eye exhibits no discharge. No scleral icterus.  Pulmonary/Chest: Effort normal.  Neurological: She is alert and oriented to person, place, and time.  Skin: She is not diaphoretic.  Psychiatric: She has a normal mood and affect. Her behavior is normal. Judgment and thought content normal.       Assessment & Plan:  1. Otitis, externa, infective, left RX coupon given.  - ciprofloxacin-hydrocortisone (CIPRO HC OTIC) otic  suspension; Place 3 drops into the left ear 2 (two) times daily.  Dispense: 10 mL; Refill: 0 - traMADol (ULTRAM) 50 MG tablet; Take 1 tablet (50 mg total) by mouth every 8 (eight) hours as needed.  Dispense: 30 tablet; Refill: 0   Aliyha Fornes PA-C  [facility] 02/14/2015 3:19 PM

## 2015-02-14 NOTE — Addendum Note (Signed)
Addended by: Hinton RaoBREWINGTON, Pharell Rolfson R on: 02/14/2015 06:19 PM   Modules accepted: Orders

## 2015-02-14 NOTE — Patient Instructions (Signed)

## 2015-02-14 NOTE — Progress Notes (Addendum)
Pt called because ear drop too expensive.  Called the pharmacy and spoke to pharmacist- they will use a cipro opthalmic drop in her left ear TID for one week

## 2015-02-16 ENCOUNTER — Encounter: Payer: Self-pay | Admitting: Physical Therapy

## 2015-02-16 ENCOUNTER — Ambulatory Visit: Payer: PRIVATE HEALTH INSURANCE | Attending: Orthopaedic Surgery | Admitting: Physical Therapy

## 2015-02-16 DIAGNOSIS — M25572 Pain in left ankle and joints of left foot: Secondary | ICD-10-CM | POA: Diagnosis not present

## 2015-02-16 DIAGNOSIS — R29898 Other symptoms and signs involving the musculoskeletal system: Secondary | ICD-10-CM | POA: Insufficient documentation

## 2015-02-16 DIAGNOSIS — M25672 Stiffness of left ankle, not elsewhere classified: Secondary | ICD-10-CM | POA: Diagnosis present

## 2015-02-16 DIAGNOSIS — R269 Unspecified abnormalities of gait and mobility: Secondary | ICD-10-CM | POA: Insufficient documentation

## 2015-02-16 DIAGNOSIS — M25673 Stiffness of unspecified ankle, not elsewhere classified: Secondary | ICD-10-CM

## 2015-02-16 NOTE — Therapy (Signed)
[facility] [address] [place], [place], [address] Phone: [phone]   Fax:  [phone]  Physical Therapy Evaluation  Patient Details  Name: Lisa GrahamKarima Le MRN: [id number] Date of Birth: 09/16/1980 Referring Provider:  Kathryne HitchBlackman, Christopher Y*  Encounter Date: 02/16/2015      PT End of Session - 02/16/15 1416    Visit Number 1   Date for PT Re-Evaluation 04/19/15   PT Start Time 1315   PT Stop Time 1412   PT Time Calculation (min) 57 min   Equipment Utilized During Treatment Other (comment)  rolling walker   Activity Tolerance Patient tolerated treatment well   Behavior During Therapy [facility] for tasks assessed/performed      Past Medical History  Diagnosis Date  . Heart palpitations 2012  . Headache   . Constipation     Past Surgical History  Procedure Laterality Date  . No past surgeries    . Orif ankle fracture Left 12/27/2014    Procedure: OPEN REDUCTION INTERNAL FIXATION (ORIF) LEFT ANKLE FRACTURE;  Surgeon: Kathryne Hitchhristopher Y Blackman, MD;  [facility]: [facility] OR;  Service: Orthopedics;  Laterality: Left;    There were no vitals filed for this visit.  Visit Diagnosis:  Left ankle pain - Plan: PT plan of care cert/re-cert  Ankle stiffness, left - Plan: PT plan of care cert/re-cert  Decreased range of motion of ankle - Plan: PT plan of care cert/re-cert  Abnormality of gait - Plan: PT plan of care cert/re-cert      Subjective Assessment - 02/16/15 1318    Subjective Pt states she was leaving work and putting her chair back and tripped, heard a huge pop and wasn't able to get up.  Surgery with L ankle ORIF 12/27/14.     Limitations Walking   How long can you sit comfortably? sitting with leg elevated   How long can you stand comfortably? fine for longer periods   How long can you walk comfortably? using walker  tried crutches but more painful   Patient Stated Goals walking without walker,    Currently in  Pain? Yes  pressure   Pain Score 4    Pain Location Ankle   Pain Orientation Left   Pain Descriptors / Indicators Pressure   Pain Type Surgical pain   Pain Onset More than a month ago   Pain Frequency Intermittent   Aggravating Factors  gravity dependent positions, walking, 4/10 pain with increased wt on L ankle   Pain Relieving Factors resting, elevating            [facility] PT Assessment - 02/16/15 0001    Assessment   Medical Diagnosis 12/27/14   Onset Date/Surgical Date 12/27/14   Hand Dominance Right   Next MD Visit 03/09/15   Prior Therapy home health care  ROM mainly, hip exercises, UE   Restrictions   Weight Bearing Restrictions Yes   LLE Weight Bearing Weight bearing as tolerated   Balance Screen   Has the patient fallen in the past 6 months Yes   How many times? 1   Has the patient had a decrease in activity level because of a fear of falling?  Yes   Is the patient reluctant to leave their home because of a fear of falling?  No   Home Environment   Living Environment Private residence   Living Arrangements Children   Available Help at Discharge Family   Type of Home Apartment   Home  Access Stairs to enter   Entrance Stairs-Number of Steps 30  ascending stairs w/railing   Entrance Stairs-Rails Right;Left   Home Layout One level   Prior Function   Level of Independence Independent   Vocation Full time employment   Vocation Requirements sitting at the computer, answering pohne   Leisure house stuff, children   AROM   Left Ankle Dorsiflexion 20  20 degrees from neutral   Left Ankle Plantar Flexion 45   Left Ankle Inversion 4   Left Ankle Eversion 2   Palpation   Palpation comment decreased scar mobility, pain on palpation ant/post, B malleoli, sensitive to touch on plantar surface especially near calcaneous   Ambulation/Gait   Ambulation/Gait Yes   Ambulation/Gait Assistance 6: Modified independent (Device/Increase time)  rolling walker   Assistive device  Rolling walker   Gait Pattern Step-to pattern   Gait Comments pain with wt bearing, fearful with weight bearing                   [facility] Adult PT Treatment/Exercise - 02/16/15 0001    Ankle Exercises: Stretches   Gastroc Stretch 4 reps  45 sec, towel pulls   Ankle Exercises: Standing   Other Standing Ankle Exercises weight shifts with walker   Ankle Exercises: Seated   Ankle Circles/Pumps AROM;Left;5 reps  4 sets,    Towel Crunch 2 reps  2 sets, 12 reps                PT Education - 02/16/15 1522    Education provided Yes   Education Details ankle exercises and passive stretch with towel scrunch   Person(s) Educated Patient   Methods Explanation;Demonstration;Handout   Comprehension Verbalized understanding          PT Short Term Goals - 02/16/15 1428    PT SHORT TERM GOAL #1   Title Pt will be ind with HEP   Time 2   Period Weeks   Status New   PT SHORT TERM GOAL #2   Title Pt will increase to FWB in ASO without walker   Time 2   Period Weeks   Status New           PT Long Term Goals - 02/16/15 1429    PT LONG TERM GOAL #1   Title Pt will amb community distances without ASO   Time 8   Period Weeks   Status New   PT LONG TERM GOAL #2   Title Pt will increase AROM of ankle DF to 10 degrees to improve gait mechanics   Time 8   Period Weeks   Status New   PT LONG TERM GOAL #3   Title Patient will increase strength by 75% to return to ADL's   Time 8   Period Weeks   Status New   PT LONG TERM GOAL #4   Title able to stand on the left leg only for 15 seconds   Time 8   Period Weeks   Status New               Plan - 02/16/15 1417    Clinical Impression Statement Pt presents to outpatient ortho following L ankle ORIF on 12/27/14.  Pt is modified ind w/rolling walker and CAM boot, step to gait with minimal pressure on L foot/ankle.  Pt reports 4/10 pain when increasing weight on L ankle.  Diffuse swelling noted throughout foot and  ankle, scar medial/lateral is intact with no openings.  Pt is TTP in all areas of the ankle especially on plantar surface near calcaneous.  Exercises were tolerated well Pt instructed in scar mobility, HEP with ankle AROM, PROM, weight shifts, as well as gait training.  Pt states she is ready to be finished with the rolling walker and stated that she does have a pair of crutches she will bring next visit.  Pt is motivated to improve and will benefit from therapy for increases in ROM, strength, gait training, endurance and functional activities.     Pt will benefit from skilled therapeutic intervention in order to improve on the following deficits Abnormal gait;Decreased activity tolerance;Decreased balance;Decreased coordination;Decreased endurance;Decreased mobility;Decreased range of motion;Decreased scar mobility;Decreased strength;Difficulty walking;Hypomobility;Increased edema;Pain   Rehab Potential Good   PT Frequency 2x / week   PT Duration 8 weeks   PT Treatment/Interventions ADLs/Self Care Home Management;Cryotherapy;Electrical Stimulation;Iontophoresis /ml Dexamethasone;Moist Heat;Ultrasound;DME Instruction;Gait training;Stair training;Functional mobility training;Therapeutic activities;Therapeutic exercise;Balance training;Neuromuscular re-education;Patient/family education;Manual techniques;Scar mobilization;Passive range of motion   PT Next Visit Plan Continue with ROM exercises as well as gait training increasing to FWB on LLE to move to ASO in the next week.  continue with HEP instruction, scar mobilization, edema control.  Add in gentle strengthening as needed.   PT Home Exercise Plan Ankle AROM, PROM, weight shifting   Consulted and Agree with Plan of Care Patient         Problem List Patient Active Problem List   Diagnosis Date Noted  . Trimalleolar fracture of left ankle 12/27/2014  . Trimalleolar fracture of ankle, closed 12/27/2014    Jearld Lesch., PT 02/16/2015,  3:27 PM  [facility] [place] [facility] [address] [place], [place], [address] Phone: [phone]   Fax:  [phone]

## 2015-02-21 ENCOUNTER — Ambulatory Visit: Payer: PRIVATE HEALTH INSURANCE | Attending: Orthopaedic Surgery | Admitting: Physical Therapy

## 2015-02-21 DIAGNOSIS — M25672 Stiffness of left ankle, not elsewhere classified: Secondary | ICD-10-CM | POA: Diagnosis present

## 2015-02-21 DIAGNOSIS — R29898 Other symptoms and signs involving the musculoskeletal system: Secondary | ICD-10-CM | POA: Diagnosis present

## 2015-02-21 DIAGNOSIS — M25673 Stiffness of unspecified ankle, not elsewhere classified: Secondary | ICD-10-CM

## 2015-02-21 DIAGNOSIS — R269 Unspecified abnormalities of gait and mobility: Secondary | ICD-10-CM | POA: Insufficient documentation

## 2015-02-21 DIAGNOSIS — M25572 Pain in left ankle and joints of left foot: Secondary | ICD-10-CM | POA: Insufficient documentation

## 2015-02-21 NOTE — Therapy (Signed)
[facility] [address] [place], [place], [address] Phone: [phone]   Fax:  [phone]  Physical Therapy Treatment  Patient Details  Name: Lisa GrahamKarima Le MRN: [id number] Date of Birth: 01/17/1981 Referring Provider:  Kathryne HitchBlackman, Christopher Y*  Encounter Date: 02/21/2015      PT End of Session - 02/21/15 1612    Visit Number 2   Date for PT Re-Evaluation 04/19/15   PT Start Time 1511   PT Stop Time 1610   PT Time Calculation (min) 59 min   Equipment Utilized During Treatment Other (comment)  single crutch   Activity Tolerance Patient tolerated treatment well   Behavior During Therapy [facility] for tasks assessed/performed      Past Medical History  Diagnosis Date  . Heart palpitations 2012  . Headache   . Constipation     Past Surgical History  Procedure Laterality Date  . No past surgeries    . Orif ankle fracture Left 12/27/2014    Procedure: OPEN REDUCTION INTERNAL FIXATION (ORIF) LEFT ANKLE FRACTURE;  Surgeon: Kathryne Hitchhristopher Y Blackman, MD;  [facility];  Service: Orthopedics;  Laterality: Left;    There were no vitals filed for this visit.  Visit Diagnosis:  Left ankle pain  Ankle stiffness, left  Decreased range of motion of ankle  Abnormality of gait      Subjective Assessment - 02/21/15 1520    Subjective Pt states she is doing well.  Progressed from the walker to a single crutch on sunday and has been doing well. Pt denies pain but does say there is pressure with wt bearing within boot.     Limitations Walking   Currently in Pain? No/denies  pressure                         [facility] Adult PT Treatment/Exercise - 02/21/15 0001    Ambulation/Gait   Gait Comments gait training with 1 crutch working on R Le step through, progressed to HHA x 2 x 40 ft   Manual Therapy   Manual Therapy Edema management   Edema Management prone, knee flexion 90 STM to encourage decrease in  swelling    Ankle Exercises: Seated   Towel Crunch 4 reps   Other Seated Ankle Exercises Towel pulls DF stretch x 4 30 sec, manual resistance DF/PF/INV/EV, metatarsal mobilizations, PROM digits-flex/ext, dynadisk-foward/backward rolling-12x both directions x 2, CCW, CW-20 revolutions each way x 2                PT Education - 02/21/15 1612    Education provided Yes   Education Details red and green theraband DF/PF/INV/EVER, gait, wt shifts   Person(s) Educated Patient   Methods Explanation;Demonstration;Tactile cues;Verbal cues;Handout   Comprehension Verbalized understanding          PT Short Term Goals - 02/16/15 1428    PT SHORT TERM GOAL #1   Title Pt will be ind with HEP   Time 2   Period Weeks   Status New   PT SHORT TERM GOAL #2   Title Pt will increase to FWB in ASO without walker   Time 2   Period Weeks   Status New           PT Long Term Goals - 02/16/15 1429    PT LONG TERM GOAL #1   Title Pt will amb community distances without ASO   Time 8   Period Weeks  Status New   PT LONG TERM GOAL #2   Title Pt will increase AROM of ankle DF to 10 degrees to improve gait mechanics   Time 8   Period Weeks   Status New   PT LONG TERM GOAL #3   Title Patient will increase strength by 75% to return to ADL's   Time 8   Period Weeks   Status New   PT LONG TERM GOAL #4   Title able to stand on the left leg only for 15 seconds   Time 8   Period Weeks   Status New               Plan - 02/21/15 1613    Clinical Impression Statement Pt progressed from rolling walker to single crutch use.  Improved gait with crutch from R step to, to R step through by end of session.  Pt did well with exercises, she has good plantar flexion, deficits in DF/Inv/Ever.  Increased L single leg wt bearing to greater than 5 second with HHA.     Pt will benefit from skilled therapeutic intervention in order to improve on the following deficits Abnormal gait;Decreased  activity tolerance;Decreased balance;Decreased coordination;Decreased endurance;Decreased mobility;Decreased range of motion;Decreased scar mobility;Decreased strength;Difficulty walking;Hypomobility;Increased edema;Pain   Rehab Potential Good   PT Frequency 2x / week   PT Duration 8 weeks   PT Treatment/Interventions ADLs/Self Care Home Management;Cryotherapy;Electrical Stimulation;Iontophoresis /ml Dexamethasone;Moist Heat;Ultrasound;DME Instruction;Gait training;Stair training;Functional mobility training;Therapeutic activities;Therapeutic exercise;Balance training;Neuromuscular re-education;Patient/family education;Manual techniques;Scar mobilization;Passive range of motion   PT Next Visit Plan continue ROM, strengthening, proprioception, gait training, try to progress to ind walking without AD.   PT Home Exercise Plan Ankle AROM, PROM, weight shifting, theraband exercises and gait training   Consulted and Agree with Plan of Care Patient        Problem List Patient Active Problem List   Diagnosis Date Noted  . Trimalleolar fracture of left ankle 12/27/2014  . Trimalleolar fracture of ankle, closed 12/27/2014    Jearld Lesch 02/21/2015, 4:26 PM  [facility] [place] [address] [place], [place], [address] Phone: [phone]   Fax:  [phone]

## 2015-02-23 ENCOUNTER — Ambulatory Visit: Payer: PRIVATE HEALTH INSURANCE | Attending: Orthopaedic Surgery | Admitting: Physical Therapy

## 2015-02-23 ENCOUNTER — Encounter: Payer: Self-pay | Admitting: Physical Therapy

## 2015-02-23 DIAGNOSIS — M25673 Stiffness of unspecified ankle, not elsewhere classified: Secondary | ICD-10-CM

## 2015-02-23 DIAGNOSIS — M25572 Pain in left ankle and joints of left foot: Secondary | ICD-10-CM | POA: Diagnosis not present

## 2015-02-23 DIAGNOSIS — R29898 Other symptoms and signs involving the musculoskeletal system: Secondary | ICD-10-CM | POA: Insufficient documentation

## 2015-02-23 DIAGNOSIS — R269 Unspecified abnormalities of gait and mobility: Secondary | ICD-10-CM | POA: Insufficient documentation

## 2015-02-23 DIAGNOSIS — M25672 Stiffness of left ankle, not elsewhere classified: Secondary | ICD-10-CM | POA: Insufficient documentation

## 2015-02-23 NOTE — Therapy (Signed)
[facility] [place] [facility] [address] [place], [place], [address] Phone: [phone]   Fax:  [phone]  Physical Therapy Treatment  Patient Details  Name: Lisa Le MRN: [id number] Date of Birth: 1981/07/30 Referring Provider:  Kathryne Hitch*  Encounter Date: 02/23/2015      PT End of Session - 02/23/15 1700    Visit Number 3   Date for PT Re-Evaluation 04/19/15   PT Start Time 1614   PT Stop Time 1703   PT Time Calculation (min) 49 min   Activity Tolerance Patient tolerated treatment well   Behavior During Therapy [facility] for tasks assessed/performed      Past Medical History  Diagnosis Date  . Heart palpitations 2012  . Headache   . Constipation     Past Surgical History  Procedure Laterality Date  . No past surgeries    . Orif ankle fracture Left 12/27/2014    Procedure: OPEN REDUCTION INTERNAL FIXATION (ORIF) LEFT ANKLE FRACTURE;  Surgeon: Kathryne Hitch, MD;  [facility]: [facility] OR;  Service: Orthopedics;  Laterality: Left;    There were no vitals filed for this visit.  Visit Diagnosis:  Left ankle pain  Ankle stiffness, left  Decreased range of motion of ankle  Abnormality of gait      Subjective Assessment - 02/23/15 1632    Subjective Doing very well.  She started using no crutches and the boot to ambulate with two days ago, brought in th eASO.   Currently in Pain? No/denies  pain /pressure mostly with weight bearing                         [facility] Adult PT Treatment/Exercise - 02/23/15 0001    Ambulation/Gait   Gait Comments gait without assistive device and use of ASO, instructed in how to wean out of boot   Ankle Exercises: Machines for Strengthening   Cybex Leg Press 40# 3 x 10, then calf press 20# 3x10   Ankle Exercises: Stretches   Gastroc Stretch 30 seconds;2 reps  with 2" toe bar under balls of feet   Ankle Exercises: Seated   BAPS Sitting;Level 3;10 reps   CW, CCW, forward/backward/side to side   Ankle Exercises: Standing   Other Standing Ankle Exercises weight shifts with walker                  PT Short Term Goals - 02/16/15 1428    PT SHORT TERM GOAL #1   Title Pt will be ind with HEP   Time 2   Period Weeks   Status New   PT SHORT TERM GOAL #2   Title Pt will increase to FWB in ASO without walker   Time 2   Period Weeks   Status New           PT Long Term Goals - 02/16/15 1429    PT LONG TERM GOAL #1   Title Pt will amb community distances without ASO   Time 8   Period Weeks   Status New   PT LONG TERM GOAL #2   Title Pt will increase AROM of ankle DF to 10 degrees to improve gait mechanics   Time 8   Period Weeks   Status New   PT LONG TERM GOAL #3   Title Patient will increase strength by 75% to return to ADL's   Time 8   Period Weeks   Status New  PT LONG TERM GOAL #4   Title able to stand on the left leg only for 15 seconds   Time 8   Period Weeks   Status New               Plan - 02/23/15 1701    Clinical Impression Statement Today worked to walking without crutches and using ASO, went over how to wean out of boot.  First few steps are still very poor but gets so much better with the next few steps   PT Next Visit Plan continue ROM, strengthening, proprioception, gait training, try to progress to ind walking without AD.   Consulted and Agree with Plan of Care Patient        Problem List Patient Active Problem List   Diagnosis Date Noted  . Trimalleolar fracture of left ankle 12/27/2014  . Trimalleolar fracture of ankle, closed 12/27/2014    Jearld Lesch., PT 02/23/2015, 5:03 PM  [facility] [place] [address] [place], [place], [address] Phone: [phone]   Fax:  [phone]

## 2015-02-27 ENCOUNTER — Encounter: Payer: Self-pay | Admitting: Physical Therapy

## 2015-02-27 ENCOUNTER — Ambulatory Visit: Payer: PRIVATE HEALTH INSURANCE | Attending: Orthopaedic Surgery | Admitting: Physical Therapy

## 2015-02-27 DIAGNOSIS — M25672 Stiffness of left ankle, not elsewhere classified: Secondary | ICD-10-CM | POA: Diagnosis present

## 2015-02-27 DIAGNOSIS — R29898 Other symptoms and signs involving the musculoskeletal system: Secondary | ICD-10-CM | POA: Insufficient documentation

## 2015-02-27 DIAGNOSIS — M25572 Pain in left ankle and joints of left foot: Secondary | ICD-10-CM | POA: Diagnosis present

## 2015-02-27 DIAGNOSIS — M25673 Stiffness of unspecified ankle, not elsewhere classified: Secondary | ICD-10-CM

## 2015-02-27 DIAGNOSIS — R269 Unspecified abnormalities of gait and mobility: Secondary | ICD-10-CM | POA: Insufficient documentation

## 2015-02-27 NOTE — Therapy (Signed)
[facility] [place] [facility] [address] [place], [place], [address] Phone: [phone]   Fax:  [phone]  Physical Therapy Treatment  Patient Details  Name: Lisa Le MRN: [id number] Date of Birth: 1981/06/10 Referring Provider:  Kathryne Hitch*  Encounter Date: 02/27/2015      PT End of Session - 02/27/15 1558    Visit Number 4   Date for PT Re-Evaluation 04/19/15   PT Start Time 1523   PT Stop Time 1614   PT Time Calculation (min) 51 min   Activity Tolerance Patient tolerated treatment well   Behavior During Therapy [facility] for tasks assessed/performed      Past Medical History  Diagnosis Date  . Heart palpitations 2012  . Headache   . Constipation     Past Surgical History  Procedure Laterality Date  . No past surgeries    . Orif ankle fracture Left 12/27/2014    Procedure: OPEN REDUCTION INTERNAL FIXATION (ORIF) LEFT ANKLE FRACTURE;  Surgeon: Kathryne Hitch, MD;  Location: [facility] OR;  Service: Orthopedics;  Laterality: Left;    There were no vitals filed for this visit.  Visit Diagnosis:  Left ankle pain  Ankle stiffness, left  Decreased range of motion of ankle  Abnormality of gait      Subjective Assessment - 02/27/15 1524    Subjective Pt said she tried to progress to the ASO but was unable to due to pain/pressure.  Had to take a tramadol yesterday due to throbbing.    Currently in Pain? Yes   Pain Score 3    Pain Location Ankle   Pain Orientation Left   Pain Descriptors / Indicators Throbbing   Pain Type Surgical pain                         [facility] Adult PT Treatment/Exercise - 02/27/15 0001    Cryotherapy   Number Minutes Cryotherapy 15 Minutes   Cryotherapy Location Ankle   Type of Cryotherapy Ice pack   Electrical Stimulation   Electrical Stimulation Location ankle   Electrical Stimulation Action IFC   Electrical Stimulation Parameters to pain tolerance   Electrical Stimulation Goals Pain   Manual Therapy   Manual Therapy Edema management;Joint mobilization;Passive ROM   Joint Mobilization DF   Passive ROM DF   Ankle Exercises: Seated   BAPS Sitting;Level 3;10 reps  2 sets   Ankle Exercises: Standing   Other Standing Ankle Exercises dyna disk                  PT Short Term Goals - 02/16/15 1428    PT SHORT TERM GOAL #1   Title Pt will be ind with HEP   Time 2   Period Weeks   Status New   PT SHORT TERM GOAL #2   Title Pt will increase to FWB in ASO without walker   Time 2   Period Weeks   Status New           PT Long Term Goals - 02/16/15 1429    PT LONG TERM GOAL #1   Title Pt will amb community distances without ASO   Time 8   Period Weeks   Status New   PT LONG TERM GOAL #2   Title Pt will increase AROM of ankle DF to 10 degrees to improve gait mechanics   Time 8   Period Weeks   Status New  PT LONG TERM GOAL #3   Title Patient will increase strength by 75% to return to ADL's   Time 8   Period Weeks   Status New   PT LONG TERM GOAL #4   Title able to stand on the left leg only for 15 seconds   Time 8   Period Weeks   Status New               Plan - 02/27/15 1559    Clinical Impression Statement Pt had increased pain with exercises and weight bearing.  Used modalities to decrease pain and will continue with exercises next treatment.  Advised pt to stay in the boot to calm down the ankle.   Pt will benefit from skilled therapeutic intervention in order to improve on the following deficits Abnormal gait;Decreased activity tolerance;Decreased balance;Decreased coordination;Decreased endurance;Decreased mobility;Decreased range of motion;Decreased scar mobility;Decreased strength;Difficulty walking;Hypomobility;Increased edema;Pain   Rehab Potential Good   PT Frequency 2x / week   PT Duration 8 weeks   PT Treatment/Interventions ADLs/Self Care Home Management;Cryotherapy;Electrical  Stimulation;Iontophoresis 4mg /ml Dexamethasone;Moist Heat;Ultrasound;DME Instruction;Gait training;Stair training;Functional mobility training;Therapeutic activities;Therapeutic exercise;Balance training;Neuromuscular re-education;Patient/family education;Manual techniques;Scar mobilization;Passive range of motion   PT Next Visit Plan continue ROM, strengthening, proprioception, gait training   PT Home Exercise Plan Ankle AROM, PROM, weight shifting, theraband exercises and gait training in boot   Consulted and Agree with Plan of Care Patient        Problem List Patient Active Problem List   Diagnosis Date Noted  . Trimalleolar fracture of left ankle 12/27/2014  . Trimalleolar fracture of ankle, closed 12/27/2014    Jearld Lesch., PT 02/27/2015, 5:01 PM  [facility] [place] [facility] [address] [place], [place], [address] Phone: [phone]   Fax:  [phone]

## 2015-03-02 ENCOUNTER — Ambulatory Visit: Payer: PRIVATE HEALTH INSURANCE | Attending: Orthopaedic Surgery | Admitting: Physical Therapy

## 2015-03-02 ENCOUNTER — Encounter: Payer: Self-pay | Admitting: Physical Therapy

## 2015-03-02 DIAGNOSIS — R269 Unspecified abnormalities of gait and mobility: Secondary | ICD-10-CM | POA: Diagnosis present

## 2015-03-02 DIAGNOSIS — R29898 Other symptoms and signs involving the musculoskeletal system: Secondary | ICD-10-CM | POA: Diagnosis present

## 2015-03-02 DIAGNOSIS — M25672 Stiffness of left ankle, not elsewhere classified: Secondary | ICD-10-CM | POA: Insufficient documentation

## 2015-03-02 DIAGNOSIS — M25572 Pain in left ankle and joints of left foot: Secondary | ICD-10-CM | POA: Insufficient documentation

## 2015-03-02 DIAGNOSIS — M25673 Stiffness of unspecified ankle, not elsewhere classified: Secondary | ICD-10-CM

## 2015-03-02 NOTE — Therapy (Signed)
[facility] [place] [address] [place], [place], [address] Phone: [phone]   Fax:  [phone]  Physical Therapy Treatment  Patient Details  Name: Lisa Le MRN: [id number] Date of Birth: [birth year] Referring Provider:  Kathryne Hitch*  Encounter Date: 03/02/2015      PT End of Session - 03/02/15 1657    Visit Number 5   Date for PT Re-Evaluation 04/19/15   PT Start Time 1610   PT Stop Time 1656   PT Time Calculation (min) 46 min   Equipment Utilized During Treatment Other (comment)  ASO   Activity Tolerance Patient tolerated treatment well   Behavior During Therapy [facility] for tasks assessed/performed      Past Medical History  Diagnosis Date  . Heart palpitations 2012  . Headache   . Constipation     Past Surgical History  Procedure Laterality Date  . No past surgeries    . Orif ankle fracture Left 12/27/2014    Procedure: OPEN REDUCTION INTERNAL FIXATION (ORIF) LEFT ANKLE FRACTURE;  Surgeon: Kathryne Hitch, MD;  Location: [facility] OR;  Service: Orthopedics;  Laterality: Left;    There were no vitals filed for this visit.  Visit Diagnosis:  Left ankle pain  Ankle stiffness, left  Decreased range of motion of ankle  Abnormality of gait      Subjective Assessment - 03/02/15 1608    Subjective Pt states she is doing better, the estim that you did on Monday helped a lot and I slept well that night.  Pt denies pain since Tues.     Currently in Pain? No/denies                         [facility] Adult PT Treatment/Exercise - 03/02/15 0001    Manual Therapy   Manual Therapy Joint mobilization;Soft tissue mobilization;Passive ROM   Manual therapy comments pt tight in all motions, guarding in DF   Joint Mobilization DF/ever/inv, metatarsals, tarsals   Passive ROM DF/Pf/inv/ever, flex/ext of digits   Ankle Exercises: Seated   Other Seated Ankle Exercises PF/DF/ever/inv/CCW/CW on  dynadisk, 10 reps x 2   Ankle Exercises: Standing   Other Standing Ankle Exercises wt shifts with HHA 2 x 10   Other Standing Ankle Exercises SLS on L x 10 with 3 sec hold   Ankle Exercises: Machines for Strengthening   Cybex Leg Press 30# 2 x 15, calf press no wt 2 x 10 with DF stretch on each rep   Ankle Exercises: Stretches   Theme park manager --                PT Education - 03/02/15 1657    Education provided Yes   Education Details progress into ASO slowly starting with 15 min increments each 1-2hrs.   Person(s) Educated Patient   Methods Explanation   Comprehension Verbalized understanding          PT Short Term Goals - 03/02/15 1703    PT SHORT TERM GOAL #1   Title Pt will be ind with HEP   Status Achieved   PT SHORT TERM GOAL #2   Title Pt will increase to FWB in ASO without walker   Status Achieved           PT Long Term Goals - 02/16/15 1429    PT LONG TERM GOAL #1   Title Pt will amb community distances without ASO   Time 8  Period Weeks   Status New   PT LONG TERM GOAL #2   Title Pt will increase AROM of ankle DF to 10 degrees to improve gait mechanics   Time 8   Period Weeks   Status New   PT LONG TERM GOAL #3   Title Patient will increase strength by 75% to return to ADL's   Time 8   Period Weeks   Status New   PT LONG TERM GOAL #4   Title able to stand on the left leg only for 15 seconds   Time 8   Period Weeks   Status New               Plan - 03/02/15 1658    Clinical Impression Statement Pt has much better disposition today with decreased pain and throbbing.  Pt did well walking in ASO, had slight INV with walking in ASO, instructed pt to make sure she pulled on the lat strap to increase stability.  pt did well with increased wt bearing in SLS   Pt will benefit from skilled therapeutic intervention in order to improve on the following deficits Abnormal gait;Decreased activity tolerance;Decreased balance;Decreased  coordination;Decreased endurance;Decreased mobility;Decreased range of motion;Decreased scar mobility;Decreased strength;Difficulty walking;Hypomobility;Increased edema;Pain   Rehab Potential Good   PT Frequency 2x / week   PT Duration 8 weeks   PT Treatment/Interventions ADLs/Self Care Home Management;Cryotherapy;Electrical Stimulation;Iontophoresis 4mg /ml Dexamethasone;Moist Heat;Ultrasound;DME Instruction;Gait training;Stair training;Functional mobility training;Therapeutic activities;Therapeutic exercise;Balance training;Neuromuscular re-education;Patient/family education;Manual techniques;Scar mobilization;Passive range of motion   PT Next Visit Plan continue ROM, strengthening, proprioception, gait training   PT Home Exercise Plan progression into ASO   Consulted and Agree with Plan of Care Patient        Problem List Patient Active Problem List   Diagnosis Date Noted  . Trimalleolar fracture of left ankle 12/27/2014  . Trimalleolar fracture of ankle, closed 12/27/2014    Jearld Lesch., PT 03/02/2015, 5:04 PM  [facility] [place] [address] [place], [place], [address] Phone: [phone]   Fax:  [phone]

## 2015-03-06 ENCOUNTER — Encounter: Payer: Self-pay | Admitting: Physical Therapy

## 2015-03-06 ENCOUNTER — Ambulatory Visit: Payer: PRIVATE HEALTH INSURANCE | Attending: Orthopaedic Surgery | Admitting: Physical Therapy

## 2015-03-06 DIAGNOSIS — M25672 Stiffness of left ankle, not elsewhere classified: Secondary | ICD-10-CM | POA: Diagnosis present

## 2015-03-06 DIAGNOSIS — M25673 Stiffness of unspecified ankle, not elsewhere classified: Secondary | ICD-10-CM

## 2015-03-06 DIAGNOSIS — R29898 Other symptoms and signs involving the musculoskeletal system: Secondary | ICD-10-CM | POA: Insufficient documentation

## 2015-03-06 DIAGNOSIS — R269 Unspecified abnormalities of gait and mobility: Secondary | ICD-10-CM | POA: Diagnosis present

## 2015-03-06 DIAGNOSIS — M25572 Pain in left ankle and joints of left foot: Secondary | ICD-10-CM | POA: Insufficient documentation

## 2015-03-06 NOTE — Therapy (Signed)
[facility] [place] [facility] [address] [place], [place], [address] Phone: [phone]   Fax:  [phone]  Physical Therapy Treatment  Patient Details  Name: Lisa Le MRN: [id number] Date of Birth: Feb 01, 1981 Referring Provider:  Kathryne Hitch*  Encounter Date: 03/06/2015      PT End of Session - 03/06/15 1504    Visit Number 6   Date for PT Re-Evaluation 04/19/15   PT Start Time 1355   PT Stop Time 1448   PT Time Calculation (min) 53 min   Activity Tolerance Patient tolerated treatment well   Behavior During Therapy [facility] for tasks assessed/performed      Past Medical History  Diagnosis Date  . Heart palpitations 2012  . Headache   . Constipation     Past Surgical History  Procedure Laterality Date  . No past surgeries    . Orif ankle fracture Left 12/27/2014    Procedure: OPEN REDUCTION INTERNAL FIXATION (ORIF) LEFT ANKLE FRACTURE;  Surgeon: Kathryne Hitch, MD;  Location: [facility] OR;  Service: Orthopedics;  Laterality: Left;    There were no vitals filed for this visit.  Visit Diagnosis:  Left ankle pain  Ankle stiffness, left  Decreased range of motion of ankle  Abnormality of gait      Subjective Assessment - 03/06/15 1353    Subjective Pt states she has been using her ASO while walking throughout her house, to go outside the house she has been using the boot.     Currently in Pain? No/denies                         [facility] Adult PT Treatment/Exercise - 03/06/15 0001    Lumbar Exercises: Aerobic   Elliptical nustep 8 min L 4   Manual Therapy   Manual Therapy Joint mobilization   Manual therapy comments pt tight in all motions, guarding in DF   Joint Mobilization DF/ever/inv, metatarsals, tarsals   Passive ROM DF/Pf/inv/ever, flex/ext of digits   Ankle Exercises: Standing   Other Standing Ankle Exercises wt shifts x 10, great toe push offs accentuating eversion   Other  Standing Ankle Exercises SLS on L x 10 with 3 sec hold x 2   Ankle Exercises: Stretches   Other Stretch Manual pressure DF/PF/Inv/Ever   Ankle Exercises: Seated   Other Seated Ankle Exercises dyna disk PF with great toe flex, eversion 2 x 10                PT Education - 03/06/15 1448    Education provided Yes   Education Details calf stretching, eversion theraband exercises, PF with great toe push off   Person(s) Educated Patient   Methods Explanation;Demonstration;Tactile cues;Verbal cues   Comprehension Verbalized understanding;Returned demonstration;Verbal cues required          PT Short Term Goals - 03/02/15 1703    PT SHORT TERM GOAL #1   Title Pt will be ind with HEP   Status Achieved   PT SHORT TERM GOAL #2   Title Pt will increase to FWB in ASO without walker   Status Achieved           PT Long Term Goals - 02/16/15 1429    PT LONG TERM GOAL #1   Title Pt will amb community distances without ASO   Time 8   Period Weeks   Status New   PT LONG TERM GOAL #2  Title Pt will increase AROM of ankle DF to 10 degrees to improve gait mechanics   Time 8   Period Weeks   Status New   PT LONG TERM GOAL #3   Title Patient will increase strength by 75% to return to ADL's   Time 8   Period Weeks   Status New   PT LONG TERM GOAL #4   Title able to stand on the left leg only for 15 seconds   Time 8   Period Weeks   Status New               Plan - 03/06/15 1504    Clinical Impression Statement Pt came into the clinic amb ind in ASO.  Slight antalgic gait.  Pt still weak going into eversion with L ankle.  When ambulating pt has increased L ankle inversion.  worked today on Academic librarian to decrease inversion.     Pt will benefit from skilled therapeutic intervention in order to improve on the following deficits Abnormal gait;Decreased activity tolerance;Decreased balance;Decreased coordination;Decreased endurance;Decreased  mobility;Decreased range of motion;Decreased scar mobility;Decreased strength;Difficulty walking;Hypomobility;Increased edema;Pain   Rehab Potential Good   PT Frequency 2x / week   PT Duration 8 weeks   PT Treatment/Interventions ADLs/Self Care Home Management;Cryotherapy;Electrical Stimulation;Iontophoresis /ml Dexamethasone;Moist Heat;Ultrasound;DME Instruction;Gait training;Stair training;Functional mobility training;Therapeutic activities;Therapeutic exercise;Balance training;Neuromuscular re-education;Patient/family education;Manual techniques;Scar mobilization;Passive range of motion   PT Next Visit Plan continue ROM, strengthening, proprioception, gait training   PT Home Exercise Plan calf stretching, PF with toe off, eversion strength   Consulted and Agree with Plan of Care Patient        Problem List Patient Active Problem List   Diagnosis Date Noted  . Trimalleolar fracture of left ankle 12/27/2014  . Trimalleolar fracture of ankle, closed 12/27/2014    Jearld Lesch., PT 03/06/2015, 5:09 PM  [facility] [place] [address] [place], [place], [address] Phone: [phone]   Fax:  [phone]

## 2015-03-08 ENCOUNTER — Ambulatory Visit: Payer: PRIVATE HEALTH INSURANCE | Attending: Orthopaedic Surgery | Admitting: Physical Therapy

## 2015-03-08 ENCOUNTER — Encounter: Payer: Self-pay | Admitting: Physical Therapy

## 2015-03-08 DIAGNOSIS — M25672 Stiffness of left ankle, not elsewhere classified: Secondary | ICD-10-CM | POA: Diagnosis present

## 2015-03-08 DIAGNOSIS — R269 Unspecified abnormalities of gait and mobility: Secondary | ICD-10-CM | POA: Insufficient documentation

## 2015-03-08 DIAGNOSIS — M25673 Stiffness of unspecified ankle, not elsewhere classified: Secondary | ICD-10-CM

## 2015-03-08 DIAGNOSIS — R29898 Other symptoms and signs involving the musculoskeletal system: Secondary | ICD-10-CM | POA: Insufficient documentation

## 2015-03-08 DIAGNOSIS — M25572 Pain in left ankle and joints of left foot: Secondary | ICD-10-CM | POA: Insufficient documentation

## 2015-03-08 NOTE — Therapy (Signed)
[facility] [place] [address] [place], [place], [address] Phone: [phone]   Fax:  [phone]  Physical Therapy Treatment  Patient Details  Name: Lisa Le MRN: [id number] Date of Birth: 1980-08-18 Referring Provider:  Kathryne Hitch*  Encounter Date: 03/08/2015      PT End of Session - 03/08/15 1651    Visit Number 7   Date for PT Re-Evaluation 04/19/15   PT Start Time 1602   PT Stop Time 1700   PT Time Calculation (min) 58 min   Activity Tolerance Patient tolerated treatment well   Behavior During Therapy [facility] for tasks assessed/performed      Past Medical History  Diagnosis Date  . Heart palpitations 2012  . Headache   . Constipation     Past Surgical History  Procedure Laterality Date  . No past surgeries    . Orif ankle fracture Left 12/27/2014    Procedure: OPEN REDUCTION INTERNAL FIXATION (ORIF) LEFT ANKLE FRACTURE;  Surgeon: Kathryne Hitch, MD;  Location: [facility] OR;  Service: Orthopedics;  Laterality: Left;    There were no vitals filed for this visit.  Visit Diagnosis:  Left ankle pain  Ankle stiffness, left  Decreased range of motion of ankle  Abnormality of gait      Subjective Assessment - 03/08/15 1605    Subjective "feeling better", less pain, still some difficulty walking   Currently in Pain? Yes   Pain Score 2    Pain Location Ankle   Pain Orientation Left   Pain Descriptors / Indicators Aching;Pressure   Pain Type Surgical pain   Aggravating Factors  walking, weight bearing on the left ankle   Pain Relieving Factors rest            [facility] PT Assessment - 03/08/15 0001    AROM   Left Ankle Dorsiflexion 5  from neutral   Left Ankle Plantar Flexion 45   Left Ankle Inversion 20   Left Ankle Eversion 11                     [facility] Adult PT Treatment/Exercise - 03/08/15 0001    Ambulation/Gait   Gait Comments gait without assistive device, with  brace on, focus on no supination, also worked on stairs step over step.   Lumbar Exercises: Aerobic   Elliptical R=6, I = 10 x 4 minutes   Tread Mill NuStep Level 5 x 6 minutes   Ankle Exercises: Doctor, hospital 30 seconds;4 reps   Ankle Exercises: Standing   Other Standing Ankle Exercises wt shifts x 10, great toe push offs accentuating eversion   Other Standing Ankle Exercises SLS on L x 10 with 3 sec hold x 2   Ankle Exercises: Seated   Other Seated Ankle Exercises dyna disk PF with great toe flex, eversion 2 x 10                  PT Short Term Goals - 03/02/15 1703    PT SHORT TERM GOAL #1   Title Pt will be ind with HEP   Status Achieved   PT SHORT TERM GOAL #2   Title Pt will increase to FWB in ASO without walker   Status Achieved           PT Long Term Goals - 03/08/15 1655    PT LONG TERM GOAL #1   Title Pt will amb community distances without ASO  Status On-going   PT LONG TERM GOAL #2   Title Pt will increase AROM of ankle DF to 10 degrees to improve gait mechanics   Status On-going   PT LONG TERM GOAL #3   Title Patient will increase strength by 75% to return to ADL's   Status On-going   PT LONG TERM GOAL #4   Title able to stand on the left leg only for 15 seconds   Status On-going               Plan - 03/08/15 1652    Clinical Impression Statement Patient with much improved ROM since evaluation, has better shoes today, her shoes the last few visits have allowed some lateral roll shich we tried to avoid.  AROM of DF is 1 degrees improved but still 5 degrees from neutral., inversion was 20 degrees and eversion was 10 degrees, gait is now without assistive device in the brace, slow, some pain in the plantar fascia and anterior ankle due to decreased DF   PT Next Visit Plan send note with patient to MD, will advance exercises and gait as tolerated   Consulted and Agree with Plan of Care Patient        Problem List Patient  Active Problem List   Diagnosis Date Noted  . Trimalleolar fracture of left ankle 12/27/2014  . Trimalleolar fracture of ankle, closed 12/27/2014    Jearld Lesch., PT 03/08/2015, 4:56 PM  [facility] [place] [address] [place], [place], [address] Phone: [phone]   Fax:  [phone]

## 2015-03-13 ENCOUNTER — Ambulatory Visit: Payer: PRIVATE HEALTH INSURANCE | Attending: Orthopaedic Surgery | Admitting: Physical Therapy

## 2015-03-13 ENCOUNTER — Encounter: Payer: Self-pay | Admitting: Physical Therapy

## 2015-03-13 DIAGNOSIS — M25672 Stiffness of left ankle, not elsewhere classified: Secondary | ICD-10-CM | POA: Diagnosis present

## 2015-03-13 DIAGNOSIS — M25572 Pain in left ankle and joints of left foot: Secondary | ICD-10-CM | POA: Insufficient documentation

## 2015-03-13 DIAGNOSIS — R269 Unspecified abnormalities of gait and mobility: Secondary | ICD-10-CM | POA: Insufficient documentation

## 2015-03-13 DIAGNOSIS — R29898 Other symptoms and signs involving the musculoskeletal system: Secondary | ICD-10-CM | POA: Diagnosis present

## 2015-03-13 DIAGNOSIS — M25673 Stiffness of unspecified ankle, not elsewhere classified: Secondary | ICD-10-CM

## 2015-03-13 NOTE — Therapy (Signed)
[facility] [place] [facility] [address] [place], [place], [address] Phone: [phone]   Fax:  [phone]  Physical Therapy Treatment  Patient Details  Name: Lisa Le MRN: [id number] Date of Birth: February 06, 1981 Referring Provider:  Kathryne Hitch*  Encounter Date: 03/13/2015      PT End of Session - 03/13/15 0842    Visit Number 8   Date for PT Re-Evaluation 04/19/15   PT Start Time 0755   PT Stop Time 0842   PT Time Calculation (min) 47 min      Past Medical History  Diagnosis Date  . Heart palpitations 2012  . Headache   . Constipation     Past Surgical History  Procedure Laterality Date  . No past surgeries    . Orif ankle fracture Left 12/27/2014    Procedure: OPEN REDUCTION INTERNAL FIXATION (ORIF) LEFT ANKLE FRACTURE;  Surgeon: Kathryne Hitch, MD;  Location: [facility] OR;  Service: Orthopedics;  Laterality: Left;    There were no vitals filed for this visit.  Visit Diagnosis:  Left ankle pain  Ankle stiffness, left  Decreased range of motion of ankle  Abnormality of gait      Subjective Assessment - 03/13/15 0755    Subjective It's good, I feel a whole lot better.  I saw the Dr. on thurs. and the bones are healing.  i see him again in another month.     Currently in Pain? No/denies                         [facility] Adult PT Treatment/Exercise - 03/13/15 0001    Lumbar Exercises: Aerobic   Elliptical R=4, I = 10 x 6 minutes   Tread Mill NuStep Level 5 x 6 minutes   Ankle Exercises: Machines for Strengthening   Cybex Leg Press 30# 2 x 15, calf press no wt 2 x 10 with DF stretch on each rep   Ankle Exercises: Standing   SLS 3 x 30 sec L leg, at wall for 1 finger balance   Other Standing Ankle Exercises step ups forward/lateral 6" box 1 x 10 each leg   Other Standing Ankle Exercises sit to stand on airex w/5 wt shift side to side x5 reps                PT Education -  03/13/15 0841    Education provided Yes   Education Details SLS balance against wall   Person(s) Educated Patient   Methods Explanation          PT Short Term Goals - 03/02/15 1703    PT SHORT TERM GOAL #1   Title Pt will be ind with HEP   Status Achieved   PT SHORT TERM GOAL #2   Title Pt will increase to FWB in ASO without walker   Status Achieved           PT Long Term Goals - 03/08/15 1655    PT LONG TERM GOAL #1   Title Pt will amb community distances without ASO   Status On-going   PT LONG TERM GOAL #2   Title Pt will increase AROM of ankle DF to 10 degrees to improve gait mechanics   Status On-going   PT LONG TERM GOAL #3   Title Patient will increase strength by 75% to return to ADL's   Status On-going   PT LONG TERM GOAL #4  Title able to stand on the left leg only for 15 seconds   Status On-going               Plan - 03/13/15 1610    Clinical Impression Statement Pt doing well with increased exercise and gait.  Still has balance/proprioception deficits as well as trouble with step ups.     Pt will benefit from skilled therapeutic intervention in order to improve on the following deficits Abnormal gait;Decreased activity tolerance;Decreased balance;Decreased coordination;Decreased endurance;Decreased mobility;Decreased range of motion;Decreased scar mobility;Decreased strength;Difficulty walking;Hypomobility;Increased edema;Pain   Rehab Potential Good   PT Frequency 2x / week   PT Duration 8 weeks   PT Treatment/Interventions ADLs/Self Care Home Management;Cryotherapy;Electrical Stimulation;Iontophoresis /ml Dexamethasone;Moist Heat;Ultrasound;DME Instruction;Gait training;Stair training;Functional mobility training;Therapeutic activities;Therapeutic exercise;Balance training;Neuromuscular re-education;Patient/family education;Manual techniques;Scar mobilization;Passive range of motion   PT Next Visit Plan balance, proprioception, strength of LE   PT  Home Exercise Plan SLS balance   Consulted and Agree with Plan of Care Patient        Problem List Patient Active Problem List   Diagnosis Date Noted  . Trimalleolar fracture of left ankle 12/27/2014  . Trimalleolar fracture of ankle, closed 12/27/2014    Jearld Lesch., PT 03/13/2015, 10:06 AM  [facility] [place] [facility] [address] [place], [place], [address] Phone: [phone]   Fax:  [phone]

## 2015-03-16 ENCOUNTER — Encounter: Payer: Self-pay | Admitting: Physical Therapy

## 2015-03-16 ENCOUNTER — Ambulatory Visit: Payer: PRIVATE HEALTH INSURANCE | Attending: Orthopaedic Surgery | Admitting: Physical Therapy

## 2015-03-16 DIAGNOSIS — R29898 Other symptoms and signs involving the musculoskeletal system: Secondary | ICD-10-CM | POA: Insufficient documentation

## 2015-03-16 DIAGNOSIS — M25672 Stiffness of left ankle, not elsewhere classified: Secondary | ICD-10-CM | POA: Diagnosis present

## 2015-03-16 DIAGNOSIS — M25572 Pain in left ankle and joints of left foot: Secondary | ICD-10-CM | POA: Insufficient documentation

## 2015-03-16 DIAGNOSIS — R269 Unspecified abnormalities of gait and mobility: Secondary | ICD-10-CM | POA: Diagnosis present

## 2015-03-16 DIAGNOSIS — M25673 Stiffness of unspecified ankle, not elsewhere classified: Secondary | ICD-10-CM

## 2015-03-16 NOTE — Therapy (Signed)
[facility] [place] [address] [place], [place], [address] Phone: [phone]   Fax:  [phone]  Physical Therapy Treatment  Patient Details  Name: Lisa Le MRN: [id number] Date of Birth: 09-Jun-1981 Referring Provider:  Kathryne Hitch*  Encounter Date: 03/16/2015      PT End of Session - 03/16/15 0852    Visit Number 9   Date for PT Re-Evaluation 04/19/15   PT Start Time 0845   PT Stop Time 0943   PT Time Calculation (min) 58 min   Activity Tolerance Patient tolerated treatment well   Behavior During Therapy [facility] for tasks assessed/performed      Past Medical History  Diagnosis Date  . Heart palpitations 2012  . Headache   . Constipation     Past Surgical History  Procedure Laterality Date  . No past surgeries    . Orif ankle fracture Left 12/27/2014    Procedure: OPEN REDUCTION INTERNAL FIXATION (ORIF) LEFT ANKLE FRACTURE;  Surgeon: Kathryne Hitch, MD;  [facility] OR;  Service: Orthopedics;  Laterality: Left;    There were no vitals filed for this visit.  Visit Diagnosis:  Left ankle pain  Ankle stiffness, left  Decreased range of motion of ankle  Abnormality of gait      Subjective Assessment - 03/16/15 0847    Subjective I'm feeling pretty good today but I was sore after the last treatment and had some pain while walking.  But today I feel better.     Currently in Pain? No/denies                         [facility] Adult PT Treatment/Exercise - 03/16/15 0001    Lumbar Exercises: Aerobic   Elliptical R=4, I = 10 x 6 minutes, forward/backward   Tread Mill NuStep Level 5 x 6 minutes   Modalities   Modalities Cryotherapy;Electrical Stimulation   Cryotherapy   Number Minutes Cryotherapy 15 Minutes   Cryotherapy Location Ankle  left   Type of Cryotherapy Ice pack   Electrical Stimulation   Electrical Stimulation Location ankle  L   Electrical Stimulation  Action IFC   Electrical Stimulation Parameters to tolerance   Electrical Stimulation Goals Pain   Ankle Exercises: Machines for Strengthening   Cybex Leg Press 40# 2 sets 10 reps, calf press no wt 2 x 10 w/DF stretch   Ankle Exercises: Standing   SLS 3 x 15 sec, at wall with finger balance  couldn't hold longer due to increased pain.                    PT Short Term Goals - 03/02/15 1703    PT SHORT TERM GOAL #1   Title Pt will be ind with HEP   Status Achieved   PT SHORT TERM GOAL #2   Title Pt will increase to FWB in ASO without walker   Status Achieved           PT Long Term Goals - 03/08/15 1655    PT LONG TERM GOAL #1   Title Pt will amb community distances without ASO   Status On-going   PT LONG TERM GOAL #2   Title Pt will increase AROM of ankle DF to 10 degrees to improve gait mechanics   Status On-going   PT LONG TERM GOAL #3   Title Patient will increase strength by 75% to return to ADL's  Status On-going   PT LONG TERM GOAL #4   Title able to stand on the left leg only for 15 seconds   Status On-going               Plan - 03/16/15 9562    Clinical Impression Statement Pt reports increased pain today near medial malleolus.  Has been trying SL balance at home.  Is able to balance on L for approx 10 sec but has increased pain.  Performed exercises and balancing today and due to increased pain applied estim/ice   Pt will benefit from skilled therapeutic intervention in order to improve on the following deficits Abnormal gait;Decreased activity tolerance;Decreased balance;Decreased coordination;Decreased endurance;Decreased mobility;Decreased range of motion;Decreased scar mobility;Decreased strength;Difficulty walking;Hypomobility;Increased edema;Pain   Rehab Potential Good   PT Frequency 2x / week   PT Duration 8 weeks   PT Treatment/Interventions ADLs/Self Care Home Management;Cryotherapy;Electrical Stimulation;Iontophoresis /ml  Dexamethasone;Moist Heat;Ultrasound;DME Instruction;Gait training;Stair training;Functional mobility training;Therapeutic activities;Therapeutic exercise;Balance training;Neuromuscular re-education;Patient/family education;Manual techniques;Scar mobilization;Passive range of motion   PT Next Visit Plan balance, proprioception, strength of LE   Consulted and Agree with Plan of Care Patient        Problem List Patient Active Problem List   Diagnosis Date Noted  . Trimalleolar fracture of left ankle 12/27/2014  . Trimalleolar fracture of ankle, closed 12/27/2014    Jearld Lesch., PT 03/16/2015, 3:55 PM  [facility] [place] [address] [place], [place], [address] Phone: [phone]   Fax:  [phone]

## 2015-03-20 ENCOUNTER — Ambulatory Visit: Payer: PRIVATE HEALTH INSURANCE | Attending: Orthopaedic Surgery | Admitting: Physical Therapy

## 2015-03-20 ENCOUNTER — Encounter: Payer: Self-pay | Admitting: Physical Therapy

## 2015-03-20 DIAGNOSIS — R269 Unspecified abnormalities of gait and mobility: Secondary | ICD-10-CM | POA: Insufficient documentation

## 2015-03-20 DIAGNOSIS — M25572 Pain in left ankle and joints of left foot: Secondary | ICD-10-CM | POA: Diagnosis present

## 2015-03-20 DIAGNOSIS — M25673 Stiffness of unspecified ankle, not elsewhere classified: Secondary | ICD-10-CM

## 2015-03-20 DIAGNOSIS — M25672 Stiffness of left ankle, not elsewhere classified: Secondary | ICD-10-CM | POA: Diagnosis present

## 2015-03-20 DIAGNOSIS — R29898 Other symptoms and signs involving the musculoskeletal system: Secondary | ICD-10-CM | POA: Insufficient documentation

## 2015-03-20 NOTE — Therapy (Signed)
[facility] [place] [address] [place], [place], [address] Phone: [phone]   Fax:  [phone]  Physical Therapy Treatment  Patient Details  Name: Lisa Le MRN: [id number] Date of Birth: 1980/09/20 Referring Provider:  Kathryne Hitch*  Encounter Date: 03/20/2015      PT End of Session - 03/20/15 1645    Visit Number 10      Past Medical History  Diagnosis Date  . Heart palpitations 2012  . Headache   . Constipation     Past Surgical History  Procedure Laterality Date  . No past surgeries    . Orif ankle fracture Left 12/27/2014    Procedure: OPEN REDUCTION INTERNAL FIXATION (ORIF) LEFT ANKLE FRACTURE;  Surgeon: Kathryne Hitch, MD;  [facility]: [facility];  Service: Orthopedics;  Laterality: Left;    There were no vitals filed for this visit.  Visit Diagnosis:  Left ankle pain  Ankle stiffness, left  Decreased range of motion of ankle  Abnormality of gait      Subjective Assessment - 03/20/15 1621    Subjective I was very sore after last treatment, rash is back on ankle.     Currently in Pain? Yes   Pain Score 4    Pain Location Ankle   Pain Orientation Left   Pain Descriptors / Indicators Aching   Pain Type Surgical pain                         [facility] Adult PT Treatment/Exercise - 03/20/15 0001    Lumbar Exercises: Aerobic   Stationary Bike 5 minutes   Cryotherapy   Number Minutes Cryotherapy 15 Minutes   Cryotherapy Location Ankle   Type of Cryotherapy Ice pack   Electrical Stimulation   Electrical Stimulation Location ankle   Electrical Stimulation Action IFC   Electrical Stimulation Parameters tolerance   Electrical Stimulation Goals Pain   Manual Therapy   Manual Therapy Joint mobilization   Manual therapy comments pt tight in all motions, guarding in DF   Edema Management prone, knee flexion 90 STM to encourage decrease in swelling    Joint Mobilization  DF/ever/inv, metatarsals, tarsals   Passive ROM DF/Pf/inv/ever, flex/ext of digits   Ankle Exercises: Seated   Other Seated Ankle Exercises dyna disk PF with great toe flex, eversion 2 x 10                  PT Short Term Goals - 03/02/15 1703    PT SHORT TERM GOAL #1   Title Pt will be ind with HEP   Status Achieved   PT SHORT TERM GOAL #2   Title Pt will increase to FWB in ASO without walker   Status Achieved           PT Long Term Goals - 03/08/15 1655    PT LONG TERM GOAL #1   Title Pt will amb community distances without ASO   Status On-going   PT LONG TERM GOAL #2   Title Pt will increase AROM of ankle DF to 10 degrees to improve gait mechanics   Status On-going   PT LONG TERM GOAL #3   Title Patient will increase strength by 75% to return to ADL's   Status On-going   PT LONG TERM GOAL #4   Title able to stand on the left leg only for 15 seconds   Status On-going  Plan - 03/20/15 1645    Clinical Impression Statement Having increased pain after last session, she is unsure of why, reports that she feels the hardware is sensitive.  Has good passive mobility she is very tender along the scars   PT Next Visit Plan see if the PROM helped decrease the pain   Consulted and Agree with Plan of Care Patient        Problem List Patient Active Problem List   Diagnosis Date Noted  . Trimalleolar fracture of left ankle 12/27/2014  . Trimalleolar fracture of ankle, closed 12/27/2014    Jearld Lesch., PT 03/20/2015, 4:48 PM  [facility] [place] [facility] [address] [place], [place], [address] Phone: [phone]   Fax:  [phone]

## 2015-03-23 ENCOUNTER — Ambulatory Visit: Payer: PRIVATE HEALTH INSURANCE | Attending: Orthopaedic Surgery | Admitting: Physical Therapy

## 2015-03-23 ENCOUNTER — Encounter: Payer: Self-pay | Admitting: Physical Therapy

## 2015-03-23 DIAGNOSIS — R269 Unspecified abnormalities of gait and mobility: Secondary | ICD-10-CM | POA: Diagnosis present

## 2015-03-23 DIAGNOSIS — M25572 Pain in left ankle and joints of left foot: Secondary | ICD-10-CM | POA: Insufficient documentation

## 2015-03-23 DIAGNOSIS — R29898 Other symptoms and signs involving the musculoskeletal system: Secondary | ICD-10-CM | POA: Diagnosis present

## 2015-03-23 DIAGNOSIS — M25672 Stiffness of left ankle, not elsewhere classified: Secondary | ICD-10-CM | POA: Diagnosis present

## 2015-03-23 DIAGNOSIS — M25673 Stiffness of unspecified ankle, not elsewhere classified: Secondary | ICD-10-CM

## 2015-03-23 NOTE — Therapy (Signed)
[facility] [address] [place], [place], [address] Phone: [phone]   Fax:  [phone]  Physical Therapy Treatment  Patient Details  Name: Lisa Le MRN: [id number] Date of Birth: [birth year] Referring Provider:  Kathryne Hitch*  Encounter Date: 03/23/2015      PT End of Session - 03/23/15 1720    Visit Number 11   Date for PT Re-Evaluation 04/19/15   PT Start Time 1630   PT Stop Time 1730   PT Time Calculation (min) 60 min      Past Medical History  Diagnosis Date  . Heart palpitations 2012  . Headache   . Constipation     Past Surgical History  Procedure Laterality Date  . No past surgeries    . Orif ankle fracture Left 12/27/2014    Procedure: OPEN REDUCTION INTERNAL FIXATION (ORIF) LEFT ANKLE FRACTURE;  Surgeon: Kathryne Hitch, MD;  [facility];  Service: Orthopedics;  Laterality: Left;    There were no vitals filed for this visit.  Visit Diagnosis:  Left ankle pain  Ankle stiffness, left  Decreased range of motion of ankle  Abnormality of gait      Subjective Assessment - 03/23/15 1714    Subjective feels okay as long as I have my brace on, shooting pains at imes   Pain Score 5    Pain Location Ankle   Pain Orientation Left                         [facility] Adult PT Treatment/Exercise - 03/23/15 0001    Exercises   Exercises Ankle   Lumbar Exercises: Aerobic   Tread Mill NuStep Level 5 x 6 minutes   Cryotherapy   Number Minutes Cryotherapy 15 Minutes   Cryotherapy Location Ankle   Type of Cryotherapy Ice pack   Electrical Stimulation   Electrical Stimulation Location ankle   Electrical Stimulation Action IFC   Electrical Stimulation Goals Pain;Edema   Manual Therapy   Manual Therapy Joint mobilization;Soft tissue mobilization;Passive ROM   Joint Mobilization DF/ever/inv, metatarsals, tarsals   Passive ROM DF/Pf/inv/ever, flex/ext of  digits   Ankle Exercises: Standing   Other Standing Ankle Exercises airex heel raises,toe raises,marching 15 times each  mod A left foot on airex RT step over    Other Standing Ankle Exercises sit sit standing WBAT   Ankle Exercises: Seated   Other Seated Ankle Exercises red tband 4 way 15 times each                  PT Short Term Goals - 03/02/15 1703    PT SHORT TERM GOAL #1   Title Pt will be ind with HEP   Status Achieved   PT SHORT TERM GOAL #2   Title Pt will increase to FWB in ASO without walker   Status Achieved           PT Long Term Goals - 03/08/15 1655    PT LONG TERM GOAL #1   Title Pt will amb community distances without ASO   Status On-going   PT LONG TERM GOAL #2   Title Pt will increase AROM of ankle DF to 10 degrees to improve gait mechanics   Status On-going   PT LONG TERM GOAL #3   Title Patient will increase strength by 75% to return to ADL's   Status On-going   PT LONG TERM GOAL #4  Title able to stand on the left leg only for 15 seconds   Status On-going               Plan - 03/23/15 1720    Clinical Impression Statement pt with decreased weight bearing and weight shift onto Left LE, very tender medial incision. Tolerated therapy with increased pain but eager to try and participate.   PT Next Visit Plan PROM, gait, ROM , Ther ex        Problem List Patient Active Problem List   Diagnosis Date Noted  . Trimalleolar fracture of left ankle 12/27/2014  . Trimalleolar fracture of ankle, closed 12/27/2014    Ceara Wrightson,ANGIE PTA 03/23/2015, 5:22 PM  [facility] [place] [facility] [address] [place], [place], [address] Phone: [phone]   Fax:  [phone]

## 2015-03-27 ENCOUNTER — Ambulatory Visit: Payer: PRIVATE HEALTH INSURANCE | Attending: Orthopaedic Surgery | Admitting: Physical Therapy

## 2015-03-27 ENCOUNTER — Encounter: Payer: Self-pay | Admitting: Physical Therapy

## 2015-03-27 DIAGNOSIS — R29898 Other symptoms and signs involving the musculoskeletal system: Secondary | ICD-10-CM | POA: Diagnosis present

## 2015-03-27 DIAGNOSIS — R269 Unspecified abnormalities of gait and mobility: Secondary | ICD-10-CM | POA: Diagnosis present

## 2015-03-27 DIAGNOSIS — M25673 Stiffness of unspecified ankle, not elsewhere classified: Secondary | ICD-10-CM

## 2015-03-27 DIAGNOSIS — M25672 Stiffness of left ankle, not elsewhere classified: Secondary | ICD-10-CM | POA: Diagnosis present

## 2015-03-27 DIAGNOSIS — M25572 Pain in left ankle and joints of left foot: Secondary | ICD-10-CM | POA: Diagnosis not present

## 2015-03-27 NOTE — Therapy (Signed)
[facility] [place] [facility] [address] [place], [place], [address] Phone: [phone]   Fax:  [phone]  Physical Therapy Treatment  Patient Details  Name: Lisa Le MRN: [id number] Date of Birth: 05-27-1981 Referring Provider:  Kathryne Hitch*  Encounter Date: 03/27/2015      PT End of Session - 03/27/15 1749    Visit Number 12   Date for PT Re-Evaluation 04/19/15   PT Start Time 1620   PT Stop Time 1720   PT Time Calculation (min) 60 min      Past Medical History  Diagnosis Date  . Heart palpitations 2012  . Headache   . Constipation     Past Surgical History  Procedure Laterality Date  . No past surgeries    . Orif ankle fracture Left 12/27/2014    Procedure: OPEN REDUCTION INTERNAL FIXATION (ORIF) LEFT ANKLE FRACTURE;  Surgeon: Kathryne Hitch, MD;  [facility];  Service: Orthopedics;  Laterality: Left;    There were no vitals filed for this visit.  Visit Diagnosis:  Left ankle pain  Ankle stiffness, left  Decreased range of motion of ankle  Abnormality of gait      Subjective Assessment - 03/27/15 1624    Subjective Just sore, somtimes shooting pains.  I am frustrated, I know I am doing more though   Currently in Pain? Yes   Pain Score 3    Pain Location Ankle   Pain Orientation Left   Pain Descriptors / Indicators Aching   Pain Onset More than a month ago   Pain Frequency Intermittent   Aggravating Factors  weight bearing   Pain Relieving Factors rest            [facility] PT Assessment - 03/27/15 0001    AROM   Left Ankle Dorsiflexion 2   Left Ankle Plantar Flexion 45   Left Ankle Inversion 20   Left Ankle Eversion 12                     [facility] Adult PT Treatment/Exercise - 03/27/15 0001    Ambulation/Gait   Gait Comments gait without brace, cues for longer steps   Lumbar Exercises: Aerobic   Stationary Bike 5 minutes, level 2   Cryotherapy   Number  Minutes Cryotherapy 15 Minutes   Cryotherapy Location Ankle   Type of Cryotherapy Ice pack   Electrical Stimulation   Electrical Stimulation Location ankle   Electrical Stimulation Action IFC   Electrical Stimulation Goals Pain   Manual Therapy   Manual Therapy Joint mobilization;Soft tissue mobilization;Passive ROM   Joint Mobilization DF/ever/inv, metatarsals, tarsals   Passive ROM DF/Pf/inv/ever, flex/ext of digits   Ankle Exercises: Standing   Other Standing Ankle Exercises sit fit exercises for ankle ROM   Ankle Exercises: Seated   Other Seated Ankle Exercises red tband 4 way 15 times each   Other Seated Ankle Exercises dyna disk PF with great toe flex, eversion 2 x 10   Ankle Exercises: Machines for Strengthening   Cybex Leg Press 40# 2 sets 10 reps, calf press no wt 2 x 10 w/DF stretch   Ankle Exercises: Stretches   Gastroc Stretch 30 seconds;4 reps   Ankle Exercises: Supine   T-Band all ankle motions green tband                  PT Short Term Goals - 03/02/15 1703    PT SHORT TERM GOAL #1  Title Pt will be ind with HEP   Status Achieved   PT SHORT TERM GOAL #2   Title Pt will increase to FWB in ASO without walker   Status Achieved           PT Long Term Goals - 03/08/15 1655    PT LONG TERM GOAL #1   Title Pt will amb community distances without ASO   Status On-going   PT LONG TERM GOAL #2   Title Pt will increase AROM of ankle DF to 10 degrees to improve gait mechanics   Status On-going   PT LONG TERM GOAL #3   Title Patient will increase strength by 75% to return to ADL's   Status On-going   PT LONG TERM GOAL #4   Title able to stand on the left leg only for 15 seconds   Status On-going               Plan - 03/27/15 1749    Clinical Impression Statement Seems to be having more pain and stiffness, c./o swelling after shopping over weekend, gait is with short steps again, decreased toe off, but had an increase in DF ROM over the past  two weeks   PT Next Visit Plan PROM, gait, ROM , Ther ex   Consulted and Agree with Plan of Care Patient        Problem List Patient Active Problem List   Diagnosis Date Noted  . Trimalleolar fracture of left ankle 12/27/2014  . Trimalleolar fracture of ankle, closed 12/27/2014    Jearld Lesch., PT 03/27/2015, 5:56 PM  [facility] [place] [facility] [address] [place], [place], [address] Phone: [phone]   Fax:  [phone]

## 2015-04-03 ENCOUNTER — Encounter: Payer: Self-pay | Admitting: Physical Therapy

## 2015-04-03 ENCOUNTER — Ambulatory Visit: Payer: PRIVATE HEALTH INSURANCE | Attending: Orthopaedic Surgery | Admitting: Physical Therapy

## 2015-04-03 DIAGNOSIS — M25672 Stiffness of left ankle, not elsewhere classified: Secondary | ICD-10-CM | POA: Insufficient documentation

## 2015-04-03 DIAGNOSIS — R269 Unspecified abnormalities of gait and mobility: Secondary | ICD-10-CM | POA: Insufficient documentation

## 2015-04-03 DIAGNOSIS — M25673 Stiffness of unspecified ankle, not elsewhere classified: Secondary | ICD-10-CM

## 2015-04-03 DIAGNOSIS — R29898 Other symptoms and signs involving the musculoskeletal system: Secondary | ICD-10-CM | POA: Insufficient documentation

## 2015-04-03 DIAGNOSIS — M25572 Pain in left ankle and joints of left foot: Secondary | ICD-10-CM | POA: Insufficient documentation

## 2015-04-03 NOTE — Therapy (Signed)
[facility] [place] [facility] [address] [place], [place], [address] Phone: [phone]   Fax:  [phone]  Physical Therapy Treatment  Patient Details  Name: Lisa Le MRN: [id number] Date of Birth: 03-08-1981 Referring Provider:  Kathryne Hitch*  Encounter Date: 04/03/2015      PT End of Session - 04/03/15 1645    Visit Number 13   Date for PT Re-Evaluation 04/19/15   PT Start Time 1601   PT Stop Time 1700   PT Time Calculation (min) 59 min   Activity Tolerance Patient tolerated treatment well   Behavior During Therapy [facility] for tasks assessed/performed      Past Medical History  Diagnosis Date  . Heart palpitations 2012  . Headache   . Constipation     Past Surgical History  Procedure Laterality Date  . No past surgeries    . Orif ankle fracture Left 12/27/2014    Procedure: OPEN REDUCTION INTERNAL FIXATION (ORIF) LEFT ANKLE FRACTURE;  Surgeon: Kathryne Hitch, MD;  Location: [facility];  Service: Orthopedics;  Laterality: Left;    There were no vitals filed for this visit.  Visit Diagnosis:  Left ankle pain  Ankle stiffness, left  Decreased range of motion of ankle  Abnormality of gait      Subjective Assessment - 04/03/15 1609    Subjective I am feeling better   Currently in Pain? Yes   Pain Score 3    Pain Location Ankle   Pain Orientation Left   Pain Descriptors / Indicators Aching                         [facility] Adult PT Treatment/Exercise - 04/03/15 0001    Ambulation/Gait   Gait Comments stairs step over step up and down   High Level Balance   High Level Balance Comments resisted gait all directions    Lumbar Exercises: Aerobic   Elliptical R=4, I = 10 x 6 minutes, forward/backward   Cryotherapy   Number Minutes Cryotherapy 15 Minutes   Cryotherapy Location Ankle   Type of Cryotherapy Ice pack   Electrical Stimulation   Electrical Stimulation Location ankle    Electrical Stimulation Action IFC   Electrical Stimulation Parameters elevated   Electrical Stimulation Goals Edema;Pain   Manual Therapy   Manual Therapy Joint mobilization;Soft tissue mobilization;Passive ROM   Joint Mobilization DF/ever/inv, metatarsals, tarsals   Passive ROM DF/Pf/inv/ever, flex/ext of digits   Ankle Exercises: Standing   Other Standing Ankle Exercises sit fit exercises for ankle ROM   Ankle Exercises: Seated   Other Seated Ankle Exercises red tband 4 way 15 times each   Other Seated Ankle Exercises dyna disk PF with great toe flex, eversion 2 x 10   Ankle Exercises: Stretches   Gastroc Stretch 30 seconds;4 reps                  PT Short Term Goals - 03/02/15 1703    PT SHORT TERM GOAL #1   Title Pt will be ind with HEP   Status Achieved   PT SHORT TERM GOAL #2   Title Pt will increase to FWB in ASO without walker   Status Achieved           PT Long Term Goals - 03/08/15 1655    PT LONG TERM GOAL #1   Title Pt will amb community distances without ASO   Status On-going   PT  LONG TERM GOAL #2   Title Pt will increase AROM of ankle DF to 10 degrees to improve gait mechanics   Status On-going   PT LONG TERM GOAL #3   Title Patient will increase strength by 75% to return to ADL's   Status On-going   PT LONG TERM GOAL #4   Title able to stand on the left leg only for 15 seconds   Status On-going               Plan - 04/03/15 1646    Clinical Impression Statement Less pain this week.  Still with antalgic gait, able to go up and down stairs step over step   PT Next Visit Plan PROM, gait, ROM , Ther ex   Consulted and Agree with Plan of Care Patient        Problem List Patient Active Problem List   Diagnosis Date Noted  . Trimalleolar fracture of left ankle 12/27/2014  . Trimalleolar fracture of ankle, closed 12/27/2014    Jearld Lesch., PT 04/03/2015, 4:50 PM  [facility] [place]  [facility] [address] [place], [place], [address] Phone: [phone]   Fax:  [phone]

## 2015-04-06 ENCOUNTER — Encounter: Payer: Self-pay | Admitting: Physical Therapy

## 2015-04-06 ENCOUNTER — Ambulatory Visit: Payer: PRIVATE HEALTH INSURANCE | Attending: Orthopaedic Surgery | Admitting: Physical Therapy

## 2015-04-06 DIAGNOSIS — R29898 Other symptoms and signs involving the musculoskeletal system: Secondary | ICD-10-CM | POA: Diagnosis present

## 2015-04-06 DIAGNOSIS — R269 Unspecified abnormalities of gait and mobility: Secondary | ICD-10-CM | POA: Insufficient documentation

## 2015-04-06 DIAGNOSIS — M25572 Pain in left ankle and joints of left foot: Secondary | ICD-10-CM | POA: Diagnosis present

## 2015-04-06 DIAGNOSIS — M25673 Stiffness of unspecified ankle, not elsewhere classified: Secondary | ICD-10-CM

## 2015-04-06 DIAGNOSIS — M25672 Stiffness of left ankle, not elsewhere classified: Secondary | ICD-10-CM | POA: Insufficient documentation

## 2015-04-06 NOTE — Therapy (Signed)
[facility] [place] [facility] [address] [place], [place], [address] Phone: [phone]   Fax:  [phone]  Physical Therapy Treatment  Patient Details  Name: Lisa Le MRN: [id number] Date of Birth: 1981-08-02 Referring Provider:  Kathryne Hitch*  Encounter Date: 04/06/2015      PT End of Session - 04/06/15 1655    Visit Number 14   Date for PT Re-Evaluation 04/19/15   PT Start Time 1604   PT Stop Time 1705   PT Time Calculation (min) 61 min      Past Medical History  Diagnosis Date  . Heart palpitations 2012  . Headache   . Constipation     Past Surgical History  Procedure Laterality Date  . No past surgeries    . Orif ankle fracture Left 12/27/2014    Procedure: OPEN REDUCTION INTERNAL FIXATION (ORIF) LEFT ANKLE FRACTURE;  Surgeon: Kathryne Hitch, MD;  Location: [facility];  Service: Orthopedics;  Laterality: Left;    There were no vitals filed for this visit.  Visit Diagnosis:  Left ankle pain  Ankle stiffness, left  Decreased range of motion of ankle  Abnormality of gait      Subjective Assessment - 04/06/15 1625    Subjective I feel good when I leave but then I get really sore   Currently in Pain? Yes   Pain Score 3    Pain Location Ankle   Pain Orientation Left;Medial;Lateral            [facility] PT Assessment - 04/06/15 0001    AROM   Left Ankle Dorsiflexion 7   Left Ankle Plantar Flexion 45   Left Ankle Inversion 20   Left Ankle Eversion 10                     [facility] Adult PT Treatment/Exercise - 04/06/15 0001    Ambulation/Gait   Gait Comments worked with her on speed and step length   High Level Balance   High Level Balance Activities Side stepping;Tandem walking;Backward walking   Lumbar Exercises: Aerobic   Elliptical R=4, I = 10 x 6 minutes, forward/backward   UBE (Upper Arm Bike) leg press 20# and no weight for press and calf raise   Modalities    Modalities Ultrasound   Cryotherapy   Number Minutes Cryotherapy 15 Minutes   Cryotherapy Location Ankle   Type of Cryotherapy Ice pack   Electrical Stimulation   Electrical Stimulation Location ankle   Electrical Stimulation Action IFC   Electrical Stimulation Parameters elevated   Electrical Stimulation Goals Edema;Pain   Ultrasound   Ultrasound Location medial and lateral    Ultrasound Parameters   Ultrasound Goals Edema;Pain   Manual Therapy   Manual Therapy Passive ROM;Joint mobilization   Joint Mobilization DF/ever/inv, metatarsals, tarsals   Passive ROM DF/Pf/inv/ever, flex/ext of digits   Ankle Exercises: Standing   Other Standing Ankle Exercises sit fit exercises for ankle ROM   Ankle Exercises: Seated   Other Seated Ankle Exercises red tband 4 way 15 times each                  PT Short Term Goals - 03/02/15 1703    PT SHORT TERM GOAL #1   Title Pt will be ind with HEP   Status Achieved   PT SHORT TERM GOAL #2   Title Pt will increase to FWB in ASO without walker   Status Achieved  PT Long Term Goals - 03/08/15 1655    PT LONG TERM GOAL #1   Title Pt will amb community distances without ASO   Status On-going   PT LONG TERM GOAL #2   Title Pt will increase AROM of ankle DF to 10 degrees to improve gait mechanics   Status On-going   PT LONG TERM GOAL #3   Title Patient will increase strength by 75% to return to ADL's   Status On-going   PT LONG TERM GOAL #4   Title able to stand on the left leg only for 15 seconds   Status On-going               Plan - 04/06/15 1656    Clinical Impression Statement She comes in with minimal limp, however as we start working on strength she has pain and starts to limp   PT Next Visit Plan PROM, gait, ROM , Ther ex, see if [place] did anything for her pain and soreness   Consulted and Agree with Plan of Care Patient        Problem List Patient Active Problem List   Diagnosis Date Noted   . Trimalleolar fracture of left ankle 12/27/2014  . Trimalleolar fracture of ankle, closed 12/27/2014    Jearld Lesch., PT 04/06/2015, 4:59 PM  [facility] [place] [facility] [address] [place], [place], [address] Phone: [phone]   Fax:  [phone]

## 2015-04-11 ENCOUNTER — Ambulatory Visit: Payer: PRIVATE HEALTH INSURANCE | Attending: Orthopaedic Surgery | Admitting: Physical Therapy

## 2015-04-11 ENCOUNTER — Encounter: Payer: Self-pay | Admitting: Physical Therapy

## 2015-04-11 DIAGNOSIS — M25672 Stiffness of left ankle, not elsewhere classified: Secondary | ICD-10-CM | POA: Insufficient documentation

## 2015-04-11 DIAGNOSIS — M25572 Pain in left ankle and joints of left foot: Secondary | ICD-10-CM | POA: Insufficient documentation

## 2015-04-11 DIAGNOSIS — M25673 Stiffness of unspecified ankle, not elsewhere classified: Secondary | ICD-10-CM

## 2015-04-11 DIAGNOSIS — R29898 Other symptoms and signs involving the musculoskeletal system: Secondary | ICD-10-CM | POA: Insufficient documentation

## 2015-04-11 DIAGNOSIS — R269 Unspecified abnormalities of gait and mobility: Secondary | ICD-10-CM | POA: Insufficient documentation

## 2015-04-11 NOTE — Therapy (Signed)
[place], [place], [address] Phone: [phone]   Fax:  [phone]  Physical Therapy Treatment  Patient Details  Name: Lisa Le MRN: [id number] Date of Birth: Feb 25, 1981 Referring Provider:  Mcarthur Rossetti*  Encounter Date: 04/11/2015      PT End of Session - 04/11/15 1701    Visit Number 15   Date for PT Re-Evaluation 04/19/15   PT Start Time 1604   PT Stop Time 1705   PT Time Calculation (min) 61 min   Activity Tolerance Patient tolerated treatment well   Behavior During Therapy [facility] for tasks assessed/performed      Past Medical History  Diagnosis Date  . Heart palpitations 2012  . Headache   . Constipation     Past Surgical History  Procedure Laterality Date  . No past surgeries    . Orif ankle fracture Left 12/27/2014    Procedure: OPEN REDUCTION INTERNAL FIXATION (ORIF) LEFT ANKLE FRACTURE;  Surgeon: Mcarthur Rossetti, MD;  Location: [place];  Service: Orthopedics;  Laterality: Left;    There were no vitals filed for this visit.  Visit Diagnosis:  Left ankle pain  Ankle stiffness, left  Decreased range of motion of ankle  Abnormality of gait      Subjective Assessment - 04/11/15 1606    Subjective Sore with weight bearing and walking   Currently in Pain? Yes   Pain Score 2    Pain Location Ankle   Pain Orientation Left;Medial;Lateral   Pain Descriptors / Indicators Aching;Sore   Pain Type Surgical pain   Pain Onset More than a month ago   Pain Frequency Intermittent   Aggravating Factors  walkinga nd weightbearing   Pain Relieving Factors rest and ice            [facility] PT Assessment - 04/11/15 0001    AROM   Left Ankle Dorsiflexion 7   Left Ankle Plantar Flexion 45   Left Ankle Inversion 20   Left Ankle Eversion 10                     [facility] Adult PT Treatment/Exercise - 04/11/15 0001    Ambulation/Gait   Gait Comments  worked with her on speed and step length   High Level Balance   High Level Balance Activities Side stepping;Braiding;Backward walking;Direction changes;Turns;Head turns;Tandem walking;Marching forwards;Figure 8 turns;Weight-shifting turns   Lumbar Exercises: Aerobic   Elliptical R=4, I = 10 x 6 minutes, forward/backward   Ankle Exercises: Standing   Other Standing Ankle Exercises sit fit exercises for ankle ROM   Ankle Exercises: Seated   Other Seated Ankle Exercises red tband 4 way 15 times each   Other Seated Ankle Exercises dyna disk PF with great toe flex, eversion 2 x 10   Ankle Exercises: Stretches   Gastroc Stretch 30 seconds;4 reps   Ankle Exercises: Supine   T-Band all ankle motions green tband   Other Supine Ankle Exercises worked on getting into a prayer positions will need some increased AROM   Ankle Exercises: Machines for Strengthening   Cybex Leg Press 40# 2 sets 10 reps, calf press no wt 2 x 10 w/DF stretch                  PT Short Term Goals - 03/02/15 1703    PT SHORT TERM GOAL #1   Title Pt will be ind with HEP   Status Achieved  PT SHORT TERM GOAL #2   Title Pt will increase to FWB in ASO without walker   Status Achieved           PT Long Term Goals - 04/11/15 1702    PT LONG TERM GOAL #1   Title Pt will amb community distances without ASO   Status Partially Met   PT LONG TERM GOAL #2   Title Pt will increase AROM of ankle DF to 10 degrees to improve gait mechanics   Status Partially Met   PT LONG TERM GOAL #3   Title Patient will increase strength by 75% to return to ADL's   Status Partially Met   PT LONG TERM GOAL #4   Title able to stand on the left leg only for 15 seconds   Status Partially Met               Plan - 04/11/15 1701    Clinical Impression Statement Proper gait without assistive device when she comes in to PT, after therapy and stretches she has increased antalgic gait   PT Next Visit Plan PROM, gait, ROM , Ther  ex,    Consulted and Agree with Plan of Care Patient        Problem List Patient Active Problem List   Diagnosis Date Noted  . Trimalleolar fracture of left ankle 12/27/2014  . Trimalleolar fracture of ankle, closed 12/27/2014    Sumner Boast., PT 04/11/2015, 5:04 PM  [place] [address] [place], [place], [address] Phone: [phone]   Fax:  [phone]

## 2015-04-13 ENCOUNTER — Encounter: Payer: Self-pay | Admitting: Physical Therapy

## 2015-04-13 ENCOUNTER — Ambulatory Visit: Payer: PRIVATE HEALTH INSURANCE | Attending: Orthopaedic Surgery | Admitting: Physical Therapy

## 2015-04-13 DIAGNOSIS — M25572 Pain in left ankle and joints of left foot: Secondary | ICD-10-CM | POA: Insufficient documentation

## 2015-04-13 DIAGNOSIS — R269 Unspecified abnormalities of gait and mobility: Secondary | ICD-10-CM | POA: Insufficient documentation

## 2015-04-13 DIAGNOSIS — R29898 Other symptoms and signs involving the musculoskeletal system: Secondary | ICD-10-CM | POA: Diagnosis present

## 2015-04-13 DIAGNOSIS — M25672 Stiffness of left ankle, not elsewhere classified: Secondary | ICD-10-CM | POA: Insufficient documentation

## 2015-04-13 DIAGNOSIS — M25673 Stiffness of unspecified ankle, not elsewhere classified: Secondary | ICD-10-CM

## 2015-04-13 NOTE — Therapy (Signed)
[place] [address] [place], [place], [address] Phone: [phone]   Fax:  [phone]  Physical Therapy Treatment  Patient Details  Name: Margarete Horace MRN: [id number] Date of Birth: [birth year] Referring Provider:  Mcarthur Rossetti*  Encounter Date: 04/13/2015      PT End of Session - 04/13/15 1634    Visit Number 16   Number of Visits 19   Date for PT Re-Evaluation 05/29/15   PT Start Time 1550   PT Stop Time 1645   PT Time Calculation (min) 55 min   Activity Tolerance Patient limited by pain   Behavior During Therapy [facility] for tasks assessed/performed      Past Medical History  Diagnosis Date  . Heart palpitations 2012  . Headache   . Constipation     Past Surgical History  Procedure Laterality Date  . No past surgeries    . Orif ankle fracture Left 12/27/2014    Procedure: OPEN REDUCTION INTERNAL FIXATION (ORIF) LEFT ANKLE FRACTURE;  Surgeon: Mcarthur Rossetti, MD;  Location: [place];  Service: Orthopedics;  Laterality: Left;    There were no vitals filed for this visit.  Visit Diagnosis:  Left ankle pain  Ankle stiffness, left  Decreased range of motion of ankle  Abnormality of gait      Subjective Assessment - 04/13/15 1611    Subjective Saw the MD, he is pleased with the progress and my walking, the fibula is still sore and healing.     Currently in Pain? Yes   Pain Score 3    Pain Location Ankle   Pain Orientation Left                         [facility] Adult PT Treatment/Exercise - 04/13/15 0001    Ambulation/Gait   Gait Comments worked with her on speed and step length   High Level Balance   High Level Balance Activities Side stepping;Braiding;Backward walking;Direction changes;Turns;Head turns;Tandem walking;Marching forwards;Figure 8 turns;Weight-shifting turns   High Level Balance Comments resisted gait all directions    Lumbar Exercises: Aerobic   Elliptical R=4, I = 10 x 6 minutes, forward/backward   Modalities   Modalities Vasopneumatic   Vasopneumatic   Number Minutes Vasopneumatic  15 minutes   Vasopnuematic Location  Ankle   Vasopneumatic Pressure Low   Vasopneumatic Temperature  40   Manual Therapy   Manual Therapy Passive ROM;Joint mobilization   Joint Mobilization DF/ever/inv, metatarsals, tarsals   Passive ROM DF/Pf/inv/ever, flex/ext of digits   Ankle Exercises: Seated   Other Seated Ankle Exercises red tband 4 way 15 times each   Other Seated Ankle Exercises dyna disk PF with great toe flex, eversion 2 x 10   Ankle Exercises: Stretches   Gastroc Stretch 30 seconds;4 reps   Ankle Exercises: Sidelying   Other Sidelying Ankle Exercises practiced prayer position                  PT Short Term Goals - 03/02/15 1703    PT SHORT TERM GOAL #1   Title Pt will be ind with HEP   Status Achieved   PT SHORT TERM GOAL #2   Title Pt will increase to FWB in ASO without walker   Status Achieved           PT Long Term Goals - 04/11/15 1702    PT LONG TERM GOAL #1   Title Pt will  amb community distances without ASO   Status Partially Met   PT LONG TERM GOAL #2   Title Pt will increase AROM of ankle DF to 10 degrees to improve gait mechanics   Status Partially Met   PT LONG TERM GOAL #3   Title Patient will increase strength by 75% to return to ADL's   Status Partially Met   PT LONG TERM GOAL #4   Title able to stand on the left leg only for 15 seconds   Status Partially Met               Plan - 04/13/15 1635    Clinical Impression Statement She did much better with prayer position today.  MD order is to continue for 4 more weeks   PT Next Visit Plan PROM, gait, ROM , Ther ex,    Consulted and Agree with Plan of Care Patient        Problem List Patient Active Problem List   Diagnosis Date Noted  . Trimalleolar fracture of left ankle 12/27/2014  . Trimalleolar fracture of ankle, closed  12/27/2014    Sumner Boast., PT 04/13/2015, 4:38 PM  [place] [address] [place], [place], [address] Phone: [phone]   Fax:  [phone]

## 2015-04-17 ENCOUNTER — Ambulatory Visit: Payer: PRIVATE HEALTH INSURANCE | Admitting: Physical Therapy

## 2015-04-20 ENCOUNTER — Ambulatory Visit: Payer: PRIVATE HEALTH INSURANCE | Attending: Orthopaedic Surgery | Admitting: Physical Therapy

## 2015-04-20 ENCOUNTER — Encounter: Payer: Self-pay | Admitting: Physical Therapy

## 2015-04-20 DIAGNOSIS — R269 Unspecified abnormalities of gait and mobility: Secondary | ICD-10-CM | POA: Diagnosis present

## 2015-04-20 DIAGNOSIS — M25673 Stiffness of unspecified ankle, not elsewhere classified: Secondary | ICD-10-CM

## 2015-04-20 DIAGNOSIS — R29898 Other symptoms and signs involving the musculoskeletal system: Secondary | ICD-10-CM | POA: Insufficient documentation

## 2015-04-20 DIAGNOSIS — M25672 Stiffness of left ankle, not elsewhere classified: Secondary | ICD-10-CM | POA: Insufficient documentation

## 2015-04-20 DIAGNOSIS — M25572 Pain in left ankle and joints of left foot: Secondary | ICD-10-CM | POA: Diagnosis present

## 2015-04-20 NOTE — Therapy (Signed)
[facility] [place] [address] [place], [place], [address] Phone: [phone]   Fax:  [phone]  Physical Therapy Treatment  Patient Details  Name: Lisa Le MRN: [id number] Date of Birth: 11/07/1980 Referring Provider:  Kathryne Hitch*  Encounter Date: 04/20/2015      PT End of Session - 04/20/15 1707    Visit Number 17   Number of Visits 22   Date for PT Re-Evaluation 05/29/15   PT Start Time 1616   PT Stop Time 1718   PT Time Calculation (min) 62 min   Activity Tolerance Patient limited by pain   Behavior During Therapy [facility] for tasks assessed/performed      Past Medical History  Diagnosis Date  . Heart palpitations 2012  . Headache   . Constipation     Past Surgical History  Procedure Laterality Date  . No past surgeries    . Orif ankle fracture Left 12/27/2014    Procedure: OPEN REDUCTION INTERNAL FIXATION (ORIF) LEFT ANKLE FRACTURE;  Surgeon: Kathryne Hitch, MD;  [facility];  Service: Orthopedics;  Laterality: Left;    There were no vitals filed for this visit.  Visit Diagnosis:  Left ankle pain  Ankle stiffness, left  Decreased range of motion of ankle  Abnormality of gait      Subjective Assessment - 04/20/15 1623    Subjective I am feeling bette, but continues to better to be sore with any walking.   Currently in Pain? Yes   Pain Score 3    Pain Location Ankle   Pain Orientation Left;Lateral   Pain Descriptors / Indicators Aching   Aggravating Factors  walking   Pain Relieving Factors rest                         [facility] Adult PT Treatment/Exercise - 04/20/15 0001    Ambulation/Gait   Gait Comments worked with her on speed and step length   High Level Balance   High Level Balance Activities Side stepping;Braiding;Backward walking;Direction changes;Turns;Head turns;Tandem walking;Marching forwards;Figure 8 turns;Weight-shifting turns   High  Level Balance Comments resisted gait all directions    Lumbar Exercises: Aerobic   Elliptical R=4, I = 10 x 6 minutes, forward/backward   Electrical Stimulation   Electrical Stimulation Location ankle   Electrical Stimulation Action IFC   Electrical Stimulation Parameters elevated   Electrical Stimulation Goals Pain   Vasopneumatic   Number Minutes Vasopneumatic  15 minutes   Vasopnuematic Location  Ankle   Vasopneumatic Pressure Medium   Vasopneumatic Temperature  35   Manual Therapy   Manual Therapy Passive ROM;Joint mobilization   Joint Mobilization DF/ever/inv, metatarsals, tarsals   Passive ROM DF/Pf/inv/ever, flex/ext of digits                  PT Short Term Goals - 03/02/15 1703    PT SHORT TERM GOAL #1   Title Pt will be ind with HEP   Status Achieved   PT SHORT TERM GOAL #2   Title Pt will increase to FWB in ASO without walker   Status Achieved           PT Long Term Goals - 04/20/15 1725    PT LONG TERM GOAL #2   Title Pt will increase AROM of ankle DF to 10 degrees to improve gait mechanics   Status Achieved  Plan - 04/20/15 1723    Clinical Impression Statement She comes into PT with minimal limp and then after stretching and some exercises she tends to have increased pain and then a limp, I have tried to decrease the amount of exercise, my concern is she has to go up and down 3 flights of stairs into the home, she has difficulty with stairs and with walking   PT Home Exercise Plan will decrease to 1x/week and see if this helps the soreness and discomfort   Consulted and Agree with Plan of Care Patient        Problem List Patient Active Problem List   Diagnosis Date Noted  . Trimalleolar fracture of left ankle 12/27/2014  . Trimalleolar fracture of ankle, closed 12/27/2014    Jearld Lesch., PT 04/20/2015, 5:26 PM  [facility] [place] [address] [place], [place], [address] Phone: [phone]   Fax:  [phone]

## 2015-04-24 ENCOUNTER — Ambulatory Visit: Payer: PRIVATE HEALTH INSURANCE | Admitting: Physical Therapy

## 2015-04-27 ENCOUNTER — Ambulatory Visit: Payer: PRIVATE HEALTH INSURANCE | Attending: Orthopaedic Surgery | Admitting: Physical Therapy

## 2015-04-27 ENCOUNTER — Encounter: Payer: Self-pay | Admitting: Physical Therapy

## 2015-04-27 DIAGNOSIS — M25572 Pain in left ankle and joints of left foot: Secondary | ICD-10-CM | POA: Insufficient documentation

## 2015-04-27 DIAGNOSIS — R269 Unspecified abnormalities of gait and mobility: Secondary | ICD-10-CM | POA: Diagnosis present

## 2015-04-27 DIAGNOSIS — M25672 Stiffness of left ankle, not elsewhere classified: Secondary | ICD-10-CM | POA: Insufficient documentation

## 2015-04-27 DIAGNOSIS — R29898 Other symptoms and signs involving the musculoskeletal system: Secondary | ICD-10-CM | POA: Insufficient documentation

## 2015-04-27 DIAGNOSIS — M25673 Stiffness of unspecified ankle, not elsewhere classified: Secondary | ICD-10-CM

## 2015-04-27 NOTE — Therapy (Signed)
[place] [address] [place], [place], [address] Phone: [phone]   Fax:  [phone]  Physical Therapy Treatment  Patient Details  Name: Lisa Le MRN: [id number] Date of Birth: Sep 07, 1980 Referring Provider:  Mcarthur Rossetti*  Encounter Date: 04/27/2015      PT End of Session - 04/27/15 1649    Visit Number 18   Number of Visits 22   Date for PT Re-Evaluation 05/29/15   PT Start Time 6389   PT Stop Time 1657   PT Time Calculation (min) 44 min   Activity Tolerance Patient limited by pain   Behavior During Therapy [facility] for tasks assessed/performed      Past Medical History  Diagnosis Date  . Heart palpitations 2012  . Headache   . Constipation     Past Surgical History  Procedure Laterality Date  . No past surgeries    . Orif ankle fracture Left 12/27/2014    Procedure: OPEN REDUCTION INTERNAL FIXATION (ORIF) LEFT ANKLE FRACTURE;  Surgeon: Mcarthur Rossetti, MD;  Location: [place];  Service: Orthopedics;  Laterality: Left;    There were no vitals filed for this visit.  Visit Diagnosis:  Left ankle pain  Ankle stiffness, left  Decreased range of motion of ankle  Abnormality of gait      Subjective Assessment - 04/27/15 1642    Subjective Less pain, but sore and swollen after walking or on feet for a longer period of time   Currently in Pain? Yes   Pain Score 1    Pain Location Ankle   Pain Orientation Left   Pain Descriptors / Indicators Aching;Tightness   Pain Type Acute pain   Aggravating Factors  being up on feet                         [facility] Adult PT Treatment/Exercise - 04/27/15 0001    Ambulation/Gait   Gait Comments worked with her on speed and step length and stairs reciprocally   Lumbar Exercises: Aerobic   Tread Mill 1.2-1.5 mph x 5 mins fwd and 1.0 mph for 2 mins backward   Manual Therapy   Manual Therapy Passive ROM;Joint mobilization   Joint Mobilization DF/ever/inv, metatarsals, tarsals   Passive ROM DF/Pf/inv/ever, flex/ext of digits   Ankle Exercises: Seated   Other Seated Ankle Exercises red tband 4 way 15 times each   Other Seated Ankle Exercises dyna disk PF with great toe flex, eversion 2 x 10   Ankle Exercises: Stretches   Soleus Stretch 20 seconds;3 reps   Gastroc Stretch 30 seconds;4 reps   Ankle Exercises: Sidelying   Other Sidelying Ankle Exercises practiced prayer position                  PT Short Term Goals - 03/02/15 1703    PT SHORT TERM GOAL #1   Title Pt will be ind with HEP   Status Achieved   PT SHORT TERM GOAL #2   Title Pt will increase to FWB in ASO without walker   Status Achieved           PT Long Term Goals - 04/27/15 1651    PT LONG TERM GOAL #1   Title Pt will amb community distances without ASO   Status Partially Met   PT LONG TERM GOAL #4   Title able to stand on the left leg only for 15 seconds  Status Partially Met               Plan - 04/27/15 1650    Clinical Impression Statement Overall much improved gait and less reports of pain, she is very timid with any lateral motions, did not want to try unseven surfaces today   PT Next Visit Plan may write MD note next visit   Consulted and Agree with Plan of Care Patient        Problem List Patient Active Problem List   Diagnosis Date Noted  . Trimalleolar fracture of left ankle 12/27/2014  . Trimalleolar fracture of ankle, closed 12/27/2014    Sumner Boast., PT 04/27/2015, 4:53 PM  [place], [place], [address] Phone: [phone]   Fax:  [phone]

## 2015-05-01 ENCOUNTER — Ambulatory Visit: Payer: PRIVATE HEALTH INSURANCE | Admitting: Physical Therapy

## 2015-05-04 ENCOUNTER — Encounter: Payer: Self-pay | Admitting: Physical Therapy

## 2015-05-04 ENCOUNTER — Ambulatory Visit: Payer: PRIVATE HEALTH INSURANCE | Attending: Orthopaedic Surgery | Admitting: Physical Therapy

## 2015-05-04 DIAGNOSIS — R29898 Other symptoms and signs involving the musculoskeletal system: Secondary | ICD-10-CM | POA: Diagnosis present

## 2015-05-04 DIAGNOSIS — R269 Unspecified abnormalities of gait and mobility: Secondary | ICD-10-CM | POA: Diagnosis present

## 2015-05-04 DIAGNOSIS — M25672 Stiffness of left ankle, not elsewhere classified: Secondary | ICD-10-CM | POA: Diagnosis present

## 2015-05-04 DIAGNOSIS — M25673 Stiffness of unspecified ankle, not elsewhere classified: Secondary | ICD-10-CM

## 2015-05-04 DIAGNOSIS — M25572 Pain in left ankle and joints of left foot: Secondary | ICD-10-CM | POA: Insufficient documentation

## 2015-05-04 NOTE — Therapy (Signed)
[place] [address] [place], [place], [address] Phone: [phone]   Fax:  [phone]  Physical Therapy Treatment  Patient Details  Name: Lisa Le MRN: [id number] Date of Birth: 1980/10/30 Referring Provider:  Mcarthur Rossetti*  Encounter Date: 05/04/2015      PT End of Session - 05/04/15 1642    Visit Number 19   Date for PT Re-Evaluation 05/29/15   PT Start Time 7026   PT Stop Time 1700   PT Time Calculation (min) 47 min   Activity Tolerance Patient tolerated treatment well   Behavior During Therapy [facility] for tasks assessed/performed        Past Surgical History  Procedure Laterality Date  . No past surgeries    . Orif ankle fracture Left 12/27/2014    Procedure: OPEN REDUCTION INTERNAL FIXATION (ORIF) LEFT ANKLE FRACTURE;  Surgeon: Mcarthur Rossetti, MD;  Location: [place];  Service: Orthopedics;  Laterality: Left;    There were no vitals filed for this visit.  Visit Diagnosis:  Left ankle pain  Ankle stiffness, left  Decreased range of motion of ankle  Abnormality of gait                       [facility] Adult PT Treatment/Exercise - 05/04/15 0001    Ambulation/Gait   Gait Comments gait working on step length and to decrease the antalgic gait, able to walk 300 feet without pain and at times ans limp but mostly due to habit.  Then went without ASO, able to walk 300 feet without ASO and no c/o increase in pain   Vasopneumatic   Number Minutes Vasopneumatic  15 minutes   Vasopnuematic Location  Ankle   Vasopneumatic Pressure Medium   Vasopneumatic Temperature  35   Ankle Exercises: Seated   Other Seated Ankle Exercises red tband 4 way 15 times each   Other Seated Ankle Exercises dyna disk PF with great toe flex, eversion 2 x 10   Ankle Exercises: Sidelying   Other Sidelying Ankle Exercises practiced prayer position                  PT Short Term Goals -  03/02/15 1703    PT SHORT TERM GOAL #1   Title Pt will be ind with HEP   Status Achieved   PT SHORT TERM GOAL #2   Title Pt will increase to FWB in ASO without walker   Status Achieved           PT Long Term Goals - 05/04/15 1647    PT LONG TERM GOAL #1   Title Pt will amb community distances without ASO   Status Partially Met   PT LONG TERM GOAL #2   Title Pt will increase AROM of ankle DF to 10 degrees to improve gait mechanics   Status Achieved   PT LONG TERM GOAL #3   Title Patient will increase strength by 75% to return to ADL's   Status Partially Met   PT LONG TERM GOAL #4   Title able to stand on the left leg only for 15 seconds   Status Achieved               Plan - 05/04/15 1644    Clinical Impression Statement Able to walk > 300 feet without pain or discomfort, with and without the ASO, she will at times demonstrate an antalgic gait but this seems to  be a habit.  She has much better ROM and was able to get into a prayer position today.  She does however still have anxiety and fear, she does have increased pain with shopping.   PT Next Visit Plan she will see MD next week   Consulted and Agree with Plan of Care Patient        Problem List Patient Active Problem List   Diagnosis Date Noted  . Trimalleolar fracture of left ankle 12/27/2014  . Trimalleolar fracture of ankle, closed 12/27/2014    Sumner Boast., PT 05/04/2015, 4:48 PM  [place] [address] [place], [place], [address] Phone: [phone]   Fax:  [phone]

## 2015-05-08 ENCOUNTER — Encounter: Payer: Self-pay | Admitting: Physical Therapy

## 2015-05-22 ENCOUNTER — Encounter: Payer: Self-pay | Admitting: Physical Therapy

## 2015-05-22 ENCOUNTER — Ambulatory Visit: Payer: PRIVATE HEALTH INSURANCE | Attending: Orthopaedic Surgery | Admitting: Physical Therapy

## 2015-05-22 DIAGNOSIS — M25672 Stiffness of left ankle, not elsewhere classified: Secondary | ICD-10-CM | POA: Insufficient documentation

## 2015-05-22 DIAGNOSIS — R269 Unspecified abnormalities of gait and mobility: Secondary | ICD-10-CM | POA: Diagnosis present

## 2015-05-22 DIAGNOSIS — R29898 Other symptoms and signs involving the musculoskeletal system: Secondary | ICD-10-CM | POA: Diagnosis present

## 2015-05-22 DIAGNOSIS — M25572 Pain in left ankle and joints of left foot: Secondary | ICD-10-CM | POA: Insufficient documentation

## 2015-05-22 DIAGNOSIS — M25673 Stiffness of unspecified ankle, not elsewhere classified: Secondary | ICD-10-CM

## 2015-05-22 NOTE — Therapy (Signed)
[facility] [address] [place], [place], [address] Phone: [phone]   Fax:  [phone]  Physical Therapy Treatment  Patient Details  Name: Liam GrahamKarima Caliendo MRN: [id number] Date of Birth: 06/02/1981 No Data Recorded  Encounter Date: 05/22/2015      PT End of Session - 05/22/15 1321    Visit Number 20   Date for PT Re-Evaluation 05/29/15   PT Start Time 1256   PT Stop Time 1345   PT Time Calculation (min) 49 min   Activity Tolerance Patient tolerated treatment well   Behavior During Therapy [facility] for tasks assessed/performed      Past Medical History  Diagnosis Date  . Heart palpitations 2012  . Headache   . Constipation     Past Surgical History  Procedure Laterality Date  . No past surgeries    . Orif ankle fracture Left 12/27/2014    Procedure: OPEN REDUCTION INTERNAL FIXATION (ORIF) LEFT ANKLE FRACTURE;  Surgeon: Kathryne Hitchhristopher Y Blackman, MD;  [facility];  Service: Orthopedics;  Laterality: Left;    There were no vitals filed for this visit.  Visit Diagnosis:  Left ankle pain  Ankle stiffness, left  Decreased range of motion of ankle  Abnormality of gait      Subjective Assessment - 05/22/15 1302    Subjective c/o pain in the left heel, she is back at work 1/2 days for the past week, she reports that she has not done her exercises or stretches   Currently in Pain? Yes   Pain Score 5    Pain Location Ankle   Pain Orientation Left   Pain Descriptors / Indicators Aching;Sore   Pain Type Acute pain   Aggravating Factors  sitting at work                         [facility]/Ex[facility] - 05/22/15 0001    Ambulation/Gait   Gait Comments gait working on step length and to decrease the antalgic gait, able to walk 300 feet without pain and at times ans limp but mostly due to habit.  Then went without ASO, able to walk 300 feet without ASO and no c/o increase in pain   Lumbar Exercises: Aerobic   Elliptical R=4, I = 10 x 6 minutes, forward/backward   Electrical Stimulation   Electrical Stimulation Location ankle   Electrical Stimulation Action IFC   Electrical Stimulation Parameters elevated   Electrical Stimulation Goals Edema;Pain   Vasopneumatic   Number Minutes Vasopneumatic  15 minutes   Vasopnuematic Location  Ankle   Vasopneumatic Pressure Medium   Vasopneumatic Temperature  35   Manual Therapy   Manual Therapy Passive ROM;Joint mobilization   Joint Mobilization DF/ever/inv, metatarsals, tarsals   Passive ROM DF/Pf/inv/ever, flex/ext of digits   Ankle Exercises: Seated   Other Seated Ankle Exercises red tband 4 way 15 times each   Other Seated Ankle Exercises dyna disk PF with great toe flex, eversion 2 x 10                  PT Short Term Goals - 03/02/15 1703    PT SHORT TERM GOAL #1   Title Pt will be ind with HEP   Status Achieved   PT SHORT TERM GOAL #2   Title Pt will increase to FWB in ASO without walker   Status Achieved  PT Long Term Goals - 05/22/15 1323    PT LONG TERM GOAL #1   Title Pt will amb community distances without ASO   Status Achieved               Plan - 05/22/15 1322    Clinical Impression Statement Patient now back at work, having increased swelling and some increase of pain, she does report that she is not doing exercises, she is trying to elevate the leg.  She is walking without brace which she seems fearful of doing this.   PT Next Visit Plan See 1x/week to see if we can help her confidence.   Consulted and Agree with Plan of Care Patient        Problem List Patient Active Problem List   Diagnosis Date Noted  . Trimalleolar fracture of left ankle 12/27/2014  . Trimalleolar fracture of ankle, closed 12/27/2014    Jearld Lesch., PT 05/22/2015, 1:24 PM  [facility] [place] [address] [place],  [place], [address] Phone: [phone]   Fax:  [phone]  Name: Chi Woodham MRN: [id number] Date of Birth: July 15, 1981

## 2015-05-31 ENCOUNTER — Ambulatory Visit: Payer: PRIVATE HEALTH INSURANCE | Admitting: Physical Therapy

## 2015-06-01 ENCOUNTER — Encounter: Payer: Self-pay | Admitting: Physical Therapy

## 2015-06-01 ENCOUNTER — Ambulatory Visit: Payer: PRIVATE HEALTH INSURANCE | Admitting: Physical Therapy

## 2015-06-01 DIAGNOSIS — M25572 Pain in left ankle and joints of left foot: Secondary | ICD-10-CM | POA: Diagnosis not present

## 2015-06-01 DIAGNOSIS — R269 Unspecified abnormalities of gait and mobility: Secondary | ICD-10-CM

## 2015-06-01 DIAGNOSIS — M25673 Stiffness of unspecified ankle, not elsewhere classified: Secondary | ICD-10-CM

## 2015-06-01 DIAGNOSIS — M25672 Stiffness of left ankle, not elsewhere classified: Secondary | ICD-10-CM

## 2015-06-01 NOTE — Therapy (Signed)
[place], [place], [address] Phone: [phone]   Fax:  [phone]  Physical Therapy Treatment  Patient Details  Name: Lisa Le MRN: [id number] Date of Birth: Feb 18, 1981 No Data Recorded  Encounter Date: 06/01/2015      PT End of Session - 06/01/15 0842    Visit Number 21   PT Start Time 0810   PT Stop Time 0846   PT Time Calculation (min) 36 min   Activity Tolerance Patient tolerated treatment well   Behavior During Therapy [facility] for tasks assessed/performed      Past Medical History  Diagnosis Date  . Heart palpitations 2012  . Headache   . Constipation     Past Surgical History  Procedure Laterality Date  . No past surgeries    . Orif ankle fracture Left 12/27/2014    Procedure: OPEN REDUCTION INTERNAL FIXATION (ORIF) LEFT ANKLE FRACTURE;  Surgeon: Mcarthur Rossetti, MD;  Location: [place];  Service: Orthopedics;  Laterality: Left;    There were no vitals filed for this visit.  Visit Diagnosis:  Left ankle pain  Ankle stiffness, left  Decreased range of motion of ankle  Abnormality of gait      Subjective Assessment - 06/01/15 0838    Subjective reports that she is overall doing better, she feels swelling and stiffness after sitting for any period of time, she is working 4 hours a day.   Currently in Pain? Yes   Pain Score 1    Pain Location Ankle   Pain Orientation Left   Pain Descriptors / Indicators Aching   Aggravating Factors  sitting, or being up long periods   Pain Relieving Factors rest                         [facility] Adult PT Treatment/Exercise - 06/01/15 0001    Ambulation/Gait   Gait Comments gait off and on curbs and up and down steps.   High Level Balance   High Level Balance Comments heel walking and toe walking   Lumbar Exercises: Aerobic   Elliptical R=4, I = 10 x 6 minutes, forward/backward   Ultrasound   Ultrasound  Location achilles   Ultrasound Parameters 3MHz   Ultrasound Goals Pain   Manual Therapy   Manual Therapy Passive ROM;Joint mobilization   Passive ROM DF/Pf/inv/ever, flex/ext of digits   Ankle Exercises: Seated   Other Seated Ankle Exercises dyna disk PF with great toe flex, eversion 2 x 10                  PT Short Term Goals - 03/02/15 1703    PT SHORT TERM GOAL #1   Title Pt will be ind with HEP   Status Achieved   PT SHORT TERM GOAL #2   Title Pt will increase to FWB in ASO without walker   Status Achieved           PT Long Term Goals - 06/01/15 0844    PT LONG TERM GOAL #3   Title Patient will increase strength by 75% to return to ADL's   Status Partially Met               Plan - 06/01/15 0842    Clinical Impression Statement Patient is working 4 hours a day, reports stiffness with walking after sitting, also reports increased swelling with sitting, reports MD wanted to try [place] to  the achilles, the scars are mobile but very sensitive, they are not as raised now as they have been   PT Next Visit Plan Will try [place] over the next two visits   Consulted and Agree with Plan of Care Patient        Problem List Patient Active Problem List   Diagnosis Date Noted  . Trimalleolar fracture of left ankle 12/27/2014  . Trimalleolar fracture of ankle, closed 12/27/2014    Sumner Boast., PT 06/01/2015, 8:45 AM  [place], [place], [address] Phone: [phone]   Fax:  [phone]  Name: Cheris Tweten MRN: [id number] Date of Birth: Jan 01, 1981

## 2015-06-07 ENCOUNTER — Ambulatory Visit: Payer: PRIVATE HEALTH INSURANCE | Attending: Orthopaedic Surgery | Admitting: Physical Therapy

## 2015-06-07 ENCOUNTER — Encounter: Payer: Self-pay | Admitting: Physical Therapy

## 2015-06-07 DIAGNOSIS — M25672 Stiffness of left ankle, not elsewhere classified: Secondary | ICD-10-CM | POA: Diagnosis not present

## 2015-06-07 DIAGNOSIS — R29898 Other symptoms and signs involving the musculoskeletal system: Secondary | ICD-10-CM | POA: Diagnosis present

## 2015-06-07 DIAGNOSIS — R269 Unspecified abnormalities of gait and mobility: Secondary | ICD-10-CM | POA: Insufficient documentation

## 2015-06-07 DIAGNOSIS — M25572 Pain in left ankle and joints of left foot: Secondary | ICD-10-CM | POA: Diagnosis present

## 2015-06-07 DIAGNOSIS — M25673 Stiffness of unspecified ankle, not elsewhere classified: Secondary | ICD-10-CM

## 2015-06-07 NOTE — Therapy (Signed)
[place], [place], [address] Phone: [phone]   Fax:  [phone]  Physical Therapy Treatment  Patient Details  Name: Lisa Le MRN: [id number] Date of Birth: Aug 23, 1980 No Data Recorded  Encounter Date: 06/07/2015      PT End of Session - 06/07/15 0843    Visit Number 22   Number of Visits 22   Date for PT Re-Evaluation 05/29/15   PT Start Time 0759   PT Stop Time 0842   PT Time Calculation (min) 43 min   Activity Tolerance Patient tolerated treatment well   Behavior During Therapy [facility] for tasks assessed/performed      Past Medical History  Diagnosis Date  . Heart palpitations 2012  . Headache   . Constipation     Past Surgical History  Procedure Laterality Date  . No past surgeries    . Orif ankle fracture Left 12/27/2014    Procedure: OPEN REDUCTION INTERNAL FIXATION (ORIF) LEFT ANKLE FRACTURE;  Surgeon: Mcarthur Rossetti, MD;  Location: [place];  Service: Orthopedics;  Laterality: Left;    There were no vitals filed for this visit.  Visit Diagnosis:  Ankle stiffness, left  Decreased range of motion of ankle  Abnormality of gait  Left ankle pain      Subjective Assessment - 06/07/15 0759    Subjective Pt reports no change, discomfort R heel    Currently in Pain? No/denies   Pain Score 0-No pain                         [facility] Adult PT Treatment/Exercise - 06/07/15 0001    Ambulation/Gait   Gait Comments negotiated 1 flight of stairs alternating pattern, walked around building up and down curbs, up and down slopes. slightly antalgic gait no LOB   High Level Balance   High Level Balance Comments heel walking and toe walking   Lumbar Exercises: Aerobic   Elliptical R=5, I = 10 x 6 minutes, forward/backward   Ultrasound   Ultrasound Location achilles   Ultrasound Parameters 3MHz   Ultrasound Goals Pain   Manual Therapy   Manual Therapy  Passive ROM;Joint mobilization   Passive ROM DF/Pf/inv/ever, flex/ext of digits                  PT Short Term Goals - 03/02/15 1703    PT SHORT TERM GOAL #1   Title Pt will be ind with HEP   Status Achieved   PT SHORT TERM GOAL #2   Title Pt will increase to FWB in ASO without walker   Status Achieved           PT Long Term Goals - 06/01/15 0844    PT LONG TERM GOAL #3   Title Patient will increase strength by 75% to return to ADL's   Status Partially Met               Plan - 06/07/15 0844    Clinical Impression Statement Pt tolerated all interventions well this date. reports pain with MT in the posterior R heel. Able to ambulate stairs with alternating pattern with one rail on descends. Ambulated around building over uneven surfaces.   Pt will benefit from skilled therapeutic intervention in order to improve on the following deficits Abnormal gait;Decreased activity tolerance;Decreased balance;Decreased coordination;Decreased endurance;Decreased mobility;Decreased range of motion;Decreased scar mobility;Decreased strength;Difficulty walking;Hypomobility;Increased edema;Pain   Rehab Potential Good  PT Frequency 2x / week   PT Duration 8 weeks   PT Treatment/Interventions ADLs/Self Care Home Management;Cryotherapy;Electrical Stimulation;Iontophoresis 52m/ml Dexamethasone;Moist Heat;Ultrasound;DME Instruction;Gait training;Stair training;Functional mobility training;Therapeutic activities;Therapeutic exercise;Balance training;Neuromuscular re-education;Patient/family education;Manual techniques;Scar mobilization;Passive range of motion   PT Next Visit Plan possible d/c   PT Home Exercise Plan will decrease to 1x/week and see if this helps the soreness and discomfort        Problem List Patient Active Problem List   Diagnosis Date Noted  . Trimalleolar fracture of left ankle 12/27/2014  . Trimalleolar fracture of ankle, closed 12/27/2014    RScot Jun PTA 06/07/2015, 8:46 AM  [place] [address]Phone: [phone]  Fax:  [phone] Name: KEdward TrevinoMRN: [id number] of Birth: [birth year]

## 2015-06-12 ENCOUNTER — Ambulatory Visit: Payer: PRIVATE HEALTH INSURANCE | Admitting: Physical Therapy

## 2015-06-27 ENCOUNTER — Ambulatory Visit: Payer: PRIVATE HEALTH INSURANCE | Attending: Orthopaedic Surgery | Admitting: Physical Therapy

## 2015-06-27 DIAGNOSIS — M25672 Stiffness of left ankle, not elsewhere classified: Secondary | ICD-10-CM | POA: Insufficient documentation

## 2015-06-27 DIAGNOSIS — R29898 Other symptoms and signs involving the musculoskeletal system: Secondary | ICD-10-CM | POA: Insufficient documentation

## 2015-06-27 DIAGNOSIS — R269 Unspecified abnormalities of gait and mobility: Secondary | ICD-10-CM | POA: Diagnosis present

## 2015-06-27 DIAGNOSIS — M25673 Stiffness of unspecified ankle, not elsewhere classified: Secondary | ICD-10-CM

## 2015-06-27 DIAGNOSIS — M25572 Pain in left ankle and joints of left foot: Secondary | ICD-10-CM | POA: Insufficient documentation

## 2015-06-27 NOTE — Therapy (Signed)
[place] [address] [place], [place], [address] Phone: [phone]   Fax:  [phone]  Physical Therapy Treatment  Patient Details  Name: Lisa Le MRN: [id number] Date of Birth: 1981/05/11 No Data Recorded  Encounter Date: 06/27/2015      PT End of Session - 06/27/15 0847    Visit Number 23   Date for PT Re-Evaluation 05/29/15   PT Start Time 0803   PT Stop Time 0848   PT Time Calculation (min) 45 min      Past Medical History  Diagnosis Date  . Heart palpitations 2012  . Headache   . Constipation     Past Surgical History  Procedure Laterality Date  . No past surgeries    . Orif ankle fracture Left 12/27/2014    Procedure: OPEN REDUCTION INTERNAL FIXATION (ORIF) LEFT ANKLE FRACTURE;  Surgeon: Mcarthur Rossetti, MD;  Location: [place];  Service: Orthopedics;  Laterality: Left;    There were no vitals filed for this visit.  Visit Diagnosis:  Ankle stiffness, left  Decreased range of motion of ankle  Abnormality of gait  Left ankle pain      Subjective Assessment - 06/27/15 0815    Subjective Pt reports no pain in the ankle at this time, reports pain in the heal and back of ankle at the end of day. Reports weakness and swelling still persist.  Reports she has been working on doing stretches since the last visit.    Currently in Pain? No/denies   Pain Score 0-No pain                         [facility] Adult PT Treatment/Exercise - 06/27/15 0001    High Level Balance   High Level Balance Comments toe bouncing on trampoline   Lumbar Exercises: Aerobic   Elliptical R=5, I = 10 x 6 minutes, forward/backward   Ultrasound   Ultrasound Location achilles   Ultrasound Parameters 3MHz   Ultrasound Goals Pain   Ankle Exercises: Machines for Strengthening   Cybex Leg Press 40# 2 sets 10 reps, calf press no wt 2 x 10 w/DF stretch   Ankle Exercises: Standing   Rocker Board Other  (comment)  PF/DF & INV/EV 10 each both                   PT Short Term Goals - 03/02/15 1703    PT SHORT TERM GOAL #1   Title Pt will be ind with HEP   Status Achieved   PT SHORT TERM GOAL #2   Title Pt will increase to FWB in ASO without walker   Status Achieved           PT Long Term Goals - 06/01/15 0844    PT LONG TERM GOAL #3   Title Patient will increase strength by 75% to return to ADL's   Status Partially Met               Plan - 06/27/15 0848    Clinical Impression Statement Pt tolerated all exercises without c/o of increased pain. Pt reported the MD wrote her a script for ultrasound and requested to continue with [place]. Pt c/o feeling screws from surgery irritating her.    PT Next Visit Plan Continue to work on ankle strengthening and balance.         Problem List Patient Active Problem List   Diagnosis Date  Noted  . Trimalleolar fracture of left ankle 12/27/2014  . Trimalleolar fracture of ankle, closed 12/27/2014    Crist Fat, SPTA 06/27/2015, 8:52 AM  [place] [address] [place], [place], [address] Phone: [phone]   Fax:  [phone]  Name: Daryan Cagley MRN: [id number] Date of Birth: 1980-10-07

## 2015-07-03 ENCOUNTER — Encounter: Payer: Self-pay | Admitting: Physical Therapy

## 2015-07-03 ENCOUNTER — Ambulatory Visit: Payer: PRIVATE HEALTH INSURANCE | Attending: Orthopaedic Surgery | Admitting: Physical Therapy

## 2015-07-03 DIAGNOSIS — M25672 Stiffness of left ankle, not elsewhere classified: Secondary | ICD-10-CM | POA: Diagnosis not present

## 2015-07-03 DIAGNOSIS — R29898 Other symptoms and signs involving the musculoskeletal system: Secondary | ICD-10-CM | POA: Diagnosis present

## 2015-07-03 DIAGNOSIS — M25572 Pain in left ankle and joints of left foot: Secondary | ICD-10-CM | POA: Diagnosis present

## 2015-07-03 DIAGNOSIS — M25673 Stiffness of unspecified ankle, not elsewhere classified: Secondary | ICD-10-CM

## 2015-07-03 NOTE — Therapy (Signed)
[place], [place], [address] Phone: [phone]   Fax:  [phone]  Physical Therapy Treatment  Patient Details  Name: Lisa Le MRN: [id number] Date of Birth: January 19, 1981 No Data Recorded  Encounter Date: 07/03/2015      PT End of Session - 07/03/15 0845    Visit Number 24   Number of Visits 30   Date for PT Re-Evaluation 07/25/15   PT Start Time 0800   PT Stop Time 0846   PT Time Calculation (min) 46 min      Past Medical History  Diagnosis Date  . Heart palpitations 2012  . Headache   . Constipation     Past Surgical History  Procedure Laterality Date  . No past surgeries    . Orif ankle fracture Left 12/27/2014    Procedure: OPEN REDUCTION INTERNAL FIXATION (ORIF) LEFT ANKLE FRACTURE;  Surgeon: Mcarthur Rossetti, MD;  Location: [place];  Service: Orthopedics;  Laterality: Left;    There were no vitals filed for this visit.  Visit Diagnosis:  Ankle stiffness, left  Decreased range of motion of ankle  Left ankle pain      Subjective Assessment - 07/03/15 0804    Subjective Patient saw the MD around the 2nd of November.  Md ordered ultrasound and continued stretching.  PT had a case manager Katharine Look) call and report for [place] not to see her.  About 3 weeks goes by and another case manager approved [place] to see her.  so we started her last week.   Currently in Pain? Yes   Pain Score 1    Pain Location Ankle   Pain Orientation Left   Pain Descriptors / Indicators Tightness   Aggravating Factors  sitting long periods, or being up on it, reports increased swelling by the end of the day   Pain Relieving Factors rest and easy motions            [facility] PT Assessment - 07/03/15 0001    AROM   Left Ankle Dorsiflexion 10   Left Ankle Plantar Flexion 45   Left Ankle Inversion 22   Left Ankle Eversion 12                     [facility] Adult PT Treatment/Exercise -  07/03/15 0001    High Level Balance   High Level Balance Comments marches and bounces on minitrampoline   Lumbar Exercises: Aerobic   Elliptical R=5, I = 10 x 6 minutes, forward/backward   Ultrasound   Ultrasound Location achilles   Ultrasound Parameters 3MHz   Ultrasound Goals Pain   Manual Therapy   Manual Therapy Passive ROM;Joint mobilization;Soft tissue mobilization   Soft tissue mobilization gentle STM of the achilles and the medial ankle, she is very tender in these areas   Passive ROM DF/Pf/inv/ever, flex/ext of digits                  PT Short Term Goals - 03/02/15 1703    PT SHORT TERM GOAL #1   Title Pt will be ind with HEP   Status Achieved   PT SHORT TERM GOAL #2   Title Pt will increase to FWB in ASO without walker   Status Achieved           PT Long Term Goals - 07/03/15 0847    PT LONG TERM GOAL #3   Title Patient will increase strength by  75% to return to ADL's   Status Partially Met               Plan - 07/03/15 0845    Clinical Impression Statement She is tender to the achilles and the medial left ankle.  Her ROM is improved and gait is better.  The tenderness is worrisome to her.   PT Next Visit Plan per MD we will continue with stretching and ultrasound   Consulted and Agree with Plan of Care Patient        Problem List Patient Active Problem List   Diagnosis Date Noted  . Trimalleolar fracture of left ankle 12/27/2014  . Trimalleolar fracture of ankle, closed 12/27/2014    Sumner Boast., PT 07/03/2015, 8:49 AM  [place], [place], [address] Phone: [phone]   Fax:  [phone]  Name: Diera Wirkkala MRN: [id number] Date of Birth: 05/14/1981

## 2015-07-06 ENCOUNTER — Ambulatory Visit: Payer: PRIVATE HEALTH INSURANCE | Attending: Orthopaedic Surgery | Admitting: Physical Therapy

## 2015-07-06 ENCOUNTER — Encounter: Payer: Self-pay | Admitting: Physical Therapy

## 2015-07-06 DIAGNOSIS — M25572 Pain in left ankle and joints of left foot: Secondary | ICD-10-CM | POA: Diagnosis present

## 2015-07-06 DIAGNOSIS — M19172 Post-traumatic osteoarthritis, left ankle and foot: Secondary | ICD-10-CM

## 2015-07-06 DIAGNOSIS — M25672 Stiffness of left ankle, not elsewhere classified: Secondary | ICD-10-CM | POA: Diagnosis not present

## 2015-07-06 DIAGNOSIS — R29898 Other symptoms and signs involving the musculoskeletal system: Secondary | ICD-10-CM | POA: Insufficient documentation

## 2015-07-06 DIAGNOSIS — M25673 Stiffness of unspecified ankle, not elsewhere classified: Secondary | ICD-10-CM

## 2015-07-06 HISTORY — DX: Post-traumatic osteoarthritis, left ankle and foot: M19.172

## 2015-07-06 NOTE — Therapy (Signed)
[place], [place], [address] Phone: [phone]   Fax:  [phone]  Physical Therapy Treatment  Patient Details  Name: Lisa Le MRN: [id number] Date of Birth: 06/17/1981 No Data Recorded  Encounter Date: 07/06/2015      PT End of Session - 07/06/15 8832    Visit Number 25   Number of Visits 30   Date for PT Re-Evaluation 07/25/15   PT Start Time 0845   PT Stop Time 0943   PT Time Calculation (min) 58 min      Past Medical History  Diagnosis Date  . Heart palpitations 2012  . Headache   . Constipation     Past Surgical History  Procedure Laterality Date  . No past surgeries    . Orif ankle fracture Left 12/27/2014    Procedure: OPEN REDUCTION INTERNAL FIXATION (ORIF) LEFT ANKLE FRACTURE;  Surgeon: Mcarthur Rossetti, MD;  Location: [place];  Service: Orthopedics;  Laterality: Left;    There were no vitals filed for this visit.  Visit Diagnosis:  Ankle stiffness, left  Decreased range of motion of ankle  Left ankle pain      Subjective Assessment - 07/06/15 0853    Subjective Pt reported no pain in the ankle this morning. Reports most trouble is still coming at the end of day with swelling. Reports she saw her doctor yesterday he took X-rays, but he wants to order an MRI because of continued swelling.     Currently in Pain? No/denies   Pain Score 0-No pain   Pain Location Ankle                         [facility] Adult PT Treatment/Exercise - 07/06/15 0001    Lumbar Exercises: Aerobic   Elliptical R=5, I = 10 x 6 minutes, forward/backward   Ultrasound   Ultrasound Location achilles    Ultrasound Parameters 3 MHz   Ultrasound Goals Pain   Vasopneumatic   Number Minutes Vasopneumatic  15 minutes   Vasopnuematic Location  Ankle   Vasopneumatic Pressure Medium   Vasopneumatic Temperature  35   Manual Therapy   Manual Therapy Passive ROM;Joint  mobilization   Soft tissue mobilization gentle STM of the achilles and the medial ankle, she is very tender in these areas   Passive ROM DF/Pf/inv/ever, flex/ext of digits   Ankle Exercises: Seated   Other Seated Ankle Exercises rocker board stretch 30 secs hold 4 ways x 3    Other Seated Ankle Exercises towel scrunch inv/ev   Ankle Exercises: Standing   Other Standing Ankle Exercises eccentric heel raises down on Left 15x2                   PT Short Term Goals - 03/02/15 1703    PT SHORT TERM GOAL #1   Title Pt will be ind with HEP   Status Achieved   PT SHORT TERM GOAL #2   Title Pt will increase to FWB in ASO without walker   Status Achieved           PT Long Term Goals - 07/03/15 0847    PT LONG TERM GOAL #3   Title Patient will increase strength by 75% to return to ADL's   Status Partially Met               Plan - 07/06/15 0927    Clinical Impression  Statement Pt is still very tender in the medial ankle to touch, presented with some swelling. Pt reported increased strech with eversion exercises. Pt waiting on approval for MRI next week.    PT Next Visit Plan Continue to work on ROM and Ultrasound         Problem List Patient Active Problem List   Diagnosis Date Noted  . Trimalleolar fracture of left ankle 12/27/2014  . Trimalleolar fracture of ankle, closed 12/27/2014    Crist Fat, SPTA 07/06/2015, 9:33 AM  [place], [place], [address] Phone: [phone]   Fax:  [phone]  Name: Lisa Le MRN: [id number] Date of Birth: 24-Nov-1980

## 2015-07-11 ENCOUNTER — Encounter: Payer: Self-pay | Admitting: Physical Therapy

## 2015-07-11 ENCOUNTER — Ambulatory Visit: Payer: PRIVATE HEALTH INSURANCE | Attending: Orthopaedic Surgery | Admitting: Physical Therapy

## 2015-07-11 DIAGNOSIS — R269 Unspecified abnormalities of gait and mobility: Secondary | ICD-10-CM | POA: Insufficient documentation

## 2015-07-11 DIAGNOSIS — M25673 Stiffness of unspecified ankle, not elsewhere classified: Secondary | ICD-10-CM

## 2015-07-11 DIAGNOSIS — M25672 Stiffness of left ankle, not elsewhere classified: Secondary | ICD-10-CM | POA: Insufficient documentation

## 2015-07-11 DIAGNOSIS — M25572 Pain in left ankle and joints of left foot: Secondary | ICD-10-CM | POA: Insufficient documentation

## 2015-07-11 DIAGNOSIS — R29898 Other symptoms and signs involving the musculoskeletal system: Secondary | ICD-10-CM | POA: Insufficient documentation

## 2015-07-11 NOTE — Therapy (Signed)
[facility] [place] [place] [address] [place], [place], [address] Phone: [phone]   Fax:  [phone]  Physical Therapy Treatment  Patient Details  Name: Lisa Le MRN: [id number] Date of Birth: May 06, 1981 No Data Recorded  Encounter Date: 07/11/2015      PT End of Session - 07/11/15 1008    Visit Number 26   Number of Visits 30   Date for PT Re-Evaluation 07/25/15   PT Start Time 0929   PT Stop Time 1015   PT Time Calculation (min) 46 min   Activity Tolerance Patient tolerated treatment well   Behavior During Therapy [facility] for tasks assessed/performed      Past Medical History  Diagnosis Date  . Heart palpitations 2012  . Headache   . Constipation     Past Surgical History  Procedure Laterality Date  . No past surgeries    . Orif ankle fracture Left 12/27/2014    Procedure: OPEN REDUCTION INTERNAL FIXATION (ORIF) LEFT ANKLE FRACTURE;  Surgeon: Kathryne Hitch, MD;  [facility];  Service: Orthopedics;  Laterality: Left;    There were no vitals filed for this visit.  Visit Diagnosis:  Ankle stiffness, left  Decreased range of motion of ankle  Left ankle pain  Abnormality of gait      Subjective Assessment - 07/11/15 1006    Subjective Patient continues to report increased pain and discomfort.  Reports not doing any exercises.  Expresses concerns about the screws.   Currently in Pain? Yes   Pain Score 2    Pain Location Ankle   Pain Orientation Left;Medial   Pain Descriptors / Indicators Aching;Tightness                         [facility] Adult PT Treatment/Exercise - 07/11/15 0001    Modalities   Modalities Iontophoresis   Ultrasound   Ultrasound Location left medial ankle   Ultrasound Parameters 100%   Ultrasound Goals Pain   Iontophoresis   Type of Iontophoresis Dexamethasone   Location left medial ankle   Dose 80mA   Time 4 hour patch   Manual Therapy   Manual  Therapy Passive ROM;Joint mobilization   Soft tissue mobilization gentle STM of the achilles and the medial ankle, she is very tender in these areas   Passive ROM DF/Pf/inv/ever, flex/ext of digits                  PT Short Term Goals - 03/02/15 1703    PT SHORT TERM GOAL #1   Title Pt will be ind with HEP   Status Achieved   PT SHORT TERM GOAL #2   Title Pt will increase to FWB in ASO without walker   Status Achieved           PT Long Term Goals - 07/11/15 1010    PT LONG TERM GOAL #3   Title Patient will increase strength by 75% to return to ADL's   Status On-going               Plan - 07/11/15 1009    Clinical Impression Statement Pateint very tender medial left ankle.  She c/o increase pain after any PT intervention.  Reports not doing any exercises at home.  Decided to try ionto today.   PT Next Visit Plan see if any decrase in pain with ionto.  May hold.   Consulted and Agree with  Plan of Care Patient        Problem List Patient Active Problem List   Diagnosis Date Noted  . Trimalleolar fracture of left ankle 12/27/2014  . Trimalleolar fracture of ankle, closed 12/27/2014    Jearld LeschALBRIGHT,Burlene Montecalvo W., PT 07/11/2015, 10:11 AM  [facility] [address] [place], [place], [address] Phone: [phone]   Fax:  [phone]  Name: Lisa Le MRN: [id number] Date of Birth: 11/15/1980

## 2015-07-13 ENCOUNTER — Encounter: Payer: Self-pay | Admitting: Physical Therapy

## 2015-07-13 ENCOUNTER — Ambulatory Visit: Payer: PRIVATE HEALTH INSURANCE | Attending: Orthopaedic Surgery | Admitting: Physical Therapy

## 2015-07-13 DIAGNOSIS — M25673 Stiffness of unspecified ankle, not elsewhere classified: Secondary | ICD-10-CM

## 2015-07-13 DIAGNOSIS — M25572 Pain in left ankle and joints of left foot: Secondary | ICD-10-CM | POA: Insufficient documentation

## 2015-07-13 DIAGNOSIS — M25672 Stiffness of left ankle, not elsewhere classified: Secondary | ICD-10-CM | POA: Insufficient documentation

## 2015-07-13 DIAGNOSIS — R269 Unspecified abnormalities of gait and mobility: Secondary | ICD-10-CM | POA: Diagnosis present

## 2015-07-13 DIAGNOSIS — R29898 Other symptoms and signs involving the musculoskeletal system: Secondary | ICD-10-CM | POA: Diagnosis present

## 2015-07-13 NOTE — Therapy (Signed)
[place], [place], [address] Phone: [phone]   Fax:  [phone]  Physical Therapy Treatment  Patient Details  Name: Lisa Le MRN: [id number] Date of Birth: 1981/03/24 No Data Recorded  Encounter Date: 07/13/2015      PT End of Session - 07/13/15 1006    Visit Number 27   Number of Visits 30   Date for PT Re-Evaluation 07/25/15   PT Start Time 0930   PT Stop Time 1008   PT Time Calculation (min) 38 min   Activity Tolerance Patient tolerated treatment well   Behavior During Therapy [facility] for tasks assessed/performed      Past Medical History  Diagnosis Date  . Heart palpitations 2012  . Headache   . Constipation     Past Surgical History  Procedure Laterality Date  . No past surgeries    . Orif ankle fracture Left 12/27/2014    Procedure: OPEN REDUCTION INTERNAL FIXATION (ORIF) LEFT ANKLE FRACTURE;  Surgeon: Mcarthur Rossetti, MD;  Location: [place];  Service: Orthopedics;  Laterality: Left;    There were no vitals filed for this visit.  Visit Diagnosis:  Ankle stiffness, left  Decreased range of motion of ankle  Left ankle pain  Abnormality of gait      Subjective Assessment - 07/13/15 1004    Subjective Patient had some skin irritation with the ionto   Currently in Pain? No/denies                         [facility] Adult PT Treatment/Exercise - 07/13/15 0001    Ambulation/Gait   Gait Comments stairs reciprocally, needed encouragement   Lumbar Exercises: Aerobic   Elliptical R=5, I = 10 x 6 minutes, forward/backward   Manual Therapy   Manual Therapy Passive ROM;Joint mobilization   Soft tissue mobilization gentle STM of the achilles and the medial ankle, she is very tender in these areas   Passive ROM DF/Pf/inv/ever, flex/ext of digits   Ankle Exercises: Seated   Other Seated Ankle Exercises towel scrunch inv/ev   Ankle Exercises: Sidelying   Other Sidelying Ankle Exercises practiced prayer position   Ankle Exercises: Supine   T-Band all ankle motions green tband                  PT Short Term Goals - 03/02/15 1703    PT SHORT TERM GOAL #1   Title Pt will be ind with HEP   Status Achieved   PT SHORT TERM GOAL #2   Title Pt will increase to FWB in ASO without walker   Status Achieved           PT Long Term Goals - 07/13/15 1008    PT LONG TERM GOAL #1   Title Pt will amb community distances without ASO   Status Achieved   PT LONG TERM GOAL #2   Title Pt will increase AROM of ankle DF to 10 degrees to improve gait mechanics   Status Achieved   PT LONG TERM GOAL #3   Title Patient will increase strength by 75% to return to ADL's   Status Partially Met   PT LONG TERM GOAL #4   Title able to stand on the left leg only for 15 seconds   Status Achieved               Plan - 07/13/15 1007    Clinical  Impression Statement no changes with ionto, again patient reports that she feels like she is doing what she wants, she does not do the exercises or stretches at home   PT Home Exercise Plan hold treatment, she reports that she will be having an MRI        Problem List Patient Active Problem List   Diagnosis Date Noted  . Trimalleolar fracture of left ankle 12/27/2014  . Trimalleolar fracture of ankle, closed 12/27/2014    Sumner Boast., PT 07/13/2015, 10:09 AM  [place], [place], [address] Phone: [phone]   Fax:  [phone]  Name: Lisa Le MRN: [id number] Date of Birth: [birth year]

## 2015-07-28 ENCOUNTER — Other Ambulatory Visit (HOSPITAL_COMMUNITY): Payer: Self-pay | Admitting: Orthopaedic Surgery

## 2015-08-01 ENCOUNTER — Other Ambulatory Visit (HOSPITAL_COMMUNITY): Payer: Self-pay | Admitting: Orthopaedic Surgery

## 2015-08-01 DIAGNOSIS — S82852A Displaced trimalleolar fracture of left lower leg, initial encounter for closed fracture: Secondary | ICD-10-CM

## 2015-08-08 ENCOUNTER — Ambulatory Visit (HOSPITAL_COMMUNITY)
Admission: RE | Admit: 2015-08-08 | Discharge: 2015-08-08 | Disposition: A | Discharge status: Home / Self Care | Payer: PRIVATE HEALTH INSURANCE | Source: Ambulatory Visit | Attending: Orthopaedic Surgery | Admitting: Orthopaedic Surgery

## 2015-08-08 DIAGNOSIS — Z9889 Other specified postprocedural states: Secondary | ICD-10-CM | POA: Insufficient documentation

## 2015-08-08 DIAGNOSIS — Z09 Encounter for follow-up examination after completed treatment for conditions other than malignant neoplasm: Secondary | ICD-10-CM | POA: Insufficient documentation

## 2015-08-08 DIAGNOSIS — S82852A Displaced trimalleolar fracture of left lower leg, initial encounter for closed fracture: Secondary | ICD-10-CM

## 2015-08-08 DIAGNOSIS — Z8781 Personal history of (healed) traumatic fracture: Secondary | ICD-10-CM | POA: Insufficient documentation

## 2015-08-09 ENCOUNTER — Encounter: Payer: Self-pay | Admitting: Podiatry

## 2015-08-09 ENCOUNTER — Ambulatory Visit (INDEPENDENT_AMBULATORY_CARE_PROVIDER_SITE_OTHER): Payer: BLUE CROSS/BLUE SHIELD | Admitting: Podiatry

## 2015-08-09 VITALS — BP 105/72 | HR 78 | Resp 14

## 2015-08-09 DIAGNOSIS — L03032 Cellulitis of left toe: Secondary | ICD-10-CM

## 2015-08-09 DIAGNOSIS — L600 Ingrowing nail: Secondary | ICD-10-CM | POA: Diagnosis not present

## 2015-08-09 NOTE — Progress Notes (Signed)
   Subjective:    Patient ID: Lisa GrahamKarima Le, female    DOB: 08/22/1980, 35 y.o.   MRN: [id number]  HPI this patient presents to the office with chief complaint of a painful outside border big toe left foot. She says it has been painful for over a week now. She says she experiences throbbing pain noted through the toe. She has attempted to soak her toe in salt water and vinegar, which did not help. He presents to the office stating she has pain and discomfort walking and wearing her shoes. She presents wearing sandals today. She presents for an evaluation and treatment of this condition The patient is here today for left great toe that is swollen, pink since 8 days ago. She is unable to wear regular shoes or socks. Tried soaking in vinegar/water but it did not help.   Review of Systems  All other systems reviewed and are negative.      Objective:   Physical Exam GENERAL APPEARANCE: Alert, conversant. Appropriately groomed. No acute distress.  VASCULAR: Pedal pulses palpable at  [facility] bilateral.  Capillary refill time is immediate to all digits,  Normal temperature gradient.  Digital hair growth is present bilateral  NEUROLOGIC: sensation is normal to 5.07 monofilament at 5/5 sites bilateral.  Light touch is intact bilateral, Muscle strength normal.  MUSCULOSKELETAL: acceptable muscle strength, tone and stability bilateral.  Intrinsic muscluature intact bilateral.  Rectus appearance of foot and digits noted bilateral.   DERMATOLOGIC: skin color, texture, and turgor are within normal limits.  No preulcerative lesions or ulcers  are seen, no interdigital maceration noted.  No open lesions present.  Digital nails are asymptomatic. No drainage noted.  NAIL  Msarked incurvation lateral border left great toe.  Redness and swelling noted.          Assessment & Plan:  Ingrown nail.  Paronychia left hallux.    IE  I & D paronychia left hallux.  Home instructions given.  RTC 1  week.   Helane GuntherGregory Phares Zaccone DPM

## 2015-08-16 ENCOUNTER — Ambulatory Visit: Payer: BLUE CROSS/BLUE SHIELD | Admitting: Podiatry

## 2015-09-07 MED FILL — CLINDAMYCIN HCL 150 MG CAP: 150 | 7 days supply | Qty: 21 | Fill #0

## 2015-09-20 ENCOUNTER — Ambulatory Visit (INDEPENDENT_AMBULATORY_CARE_PROVIDER_SITE_OTHER): Payer: BLUE CROSS/BLUE SHIELD | Admitting: Family Medicine

## 2015-09-20 VITALS — BP 126/80 | HR 83 | Temp 97.5°F | Resp 16 | Ht 68.0 in | Wt 279.4 lb

## 2015-09-20 DIAGNOSIS — R110 Nausea: Secondary | ICD-10-CM | POA: Diagnosis not present

## 2015-09-20 DIAGNOSIS — T50905A Adverse effect of unspecified drugs, medicaments and biological substances, initial encounter: Secondary | ICD-10-CM

## 2015-09-20 DIAGNOSIS — T887XXA Unspecified adverse effect of drug or medicament, initial encounter: Secondary | ICD-10-CM | POA: Diagnosis not present

## 2015-09-20 NOTE — Progress Notes (Signed)
   HPI  Patient presents today here after receiving MMR vaccine  She states that this AM she received MMR at employee health at 820 am, About 10 am she developed nausea without vomiting, watering mouth and mild neck stiffness The taste in her mouth persists but the nausea has resolved.,  She continues to have mild neck stiffness.   She denies itching, rash, dyspnea, throat irritation or fullness, or other concerns  PMH: Smoking status noted ROS: Per HPI  Objective: BP 126/80 mmHg  Pulse 83  Temp(Src) 97.5 F (36.4 C) (Oral)  Resp 16  Ht '5\' 8"'$  (1.727 m)  Wt 279 lb 6.4 oz (126.735 kg)  BMI 42.49 kg/m2  SpO2 97% Gen: NAD, alert, cooperative with exam HEENT: NCAT, MMM, oropharynx clear, PERLA Neck: no rash, no lesion, full ROM but appears to be slow moving/stiff CV: RRR, good S1/S2, no murmur Resp: CTABL, no wheezes, non-labored Ext: No edema, warm Neuro: Alert and oriented, No gross deficits  Assessment and plan:  # Medication reaction, to MMR vaccine According to [employer] website 1/4 young (usaually teenage) women have joint stiffness that is usually temporary No signs of true allergic reaction Discussed reasons to return or seek emergency medical care She would like to return to work.  Discussed antihistamine if needed.     Laroy Apple, MD [facility] 09/20/2015, 12:43 PM

## 2015-09-20 NOTE — Patient Instructions (Signed)
Great to meet you!  It looks like everything will be fine, If you have any concerns please get medical attention right away.   Specifically if you develop fullness of the throat, difficult breathing, feeling faint, or fever please seek help.

## 2015-11-12 ENCOUNTER — Ambulatory Visit (INDEPENDENT_AMBULATORY_CARE_PROVIDER_SITE_OTHER): Payer: BLUE CROSS/BLUE SHIELD | Admitting: Physician Assistant

## 2015-11-12 VITALS — BP 118/80 | HR 96 | Temp 98.4°F | Resp 17 | Ht 68.0 in | Wt 279.4 lb

## 2015-11-12 DIAGNOSIS — J029 Acute pharyngitis, unspecified: Secondary | ICD-10-CM | POA: Diagnosis not present

## 2015-11-12 DIAGNOSIS — J069 Acute upper respiratory infection, unspecified: Secondary | ICD-10-CM | POA: Diagnosis not present

## 2015-11-12 LAB — POCT RAPID STREP A (OFFICE): Rapid Strep A Screen: NEGATIVE

## 2015-11-12 MED ORDER — AMOXICILLIN 875 MG PO TABS
875.0000 mg | ORAL_TABLET | Freq: Two times a day (BID) | ORAL | Status: DC
Start: 2015-11-12 — End: 2016-03-22

## 2015-11-12 NOTE — Progress Notes (Signed)
11/12/2015 5:40 PM   DOB: 03/22/1981 / MRN: [id number]  SUBJECTIVE:  Lisa Le is a 35 y.o. female presenting for sore throat and ear pain that have been present three days.  Associates mild chills which resolved yesterday.  Denies fever, chills, nausea and cough.  Denies sneezing, itchy eyes, and itchy ears.  Has tried lemon and honey tea with some relief of pain. She feels she is getting worse.   She is allergic to latex.   She  has a past medical history of Heart palpitations (2012); Headache; and Constipation.    She  reports that she has never smoked. She has never used smokeless tobacco. She reports that she does not drink alcohol or use illicit drugs. She  has no sexual activity history on file. The patient  has past surgical history that includes No past surgeries and ORIF ankle fracture (Left, 12/27/2014).  Her family history includes Heart disease in her mother; Hypertension in her mother.  Review of Systems  Constitutional: Positive for malaise/fatigue. Negative for fever, chills and diaphoresis.  HENT: Positive for congestion and sore throat.   Respiratory: Positive for cough. Negative for hemoptysis, shortness of breath and wheezing.   Cardiovascular: Negative for chest pain.  Gastrointestinal: Negative for nausea.  Skin: Negative for rash.  Neurological: Negative for dizziness and weakness.  Endo/Heme/Allergies: Negative for polydipsia.    Problem list and medications reviewed and updated by myself where necessary, and exist elsewhere in the encounter.   OBJECTIVE:  BP 118/80 mmHg  Pulse 96  Temp(Src) 98.4 F (36.9 C) (Oral)  Resp 17  Ht 5\' 8"  (1.727 m)  Wt 279 lb 6.4 oz (126.735 kg)  BMI 42.49 kg/m2  SpO2 97%  Physical Exam  Constitutional: She is oriented to person, place, and time.  HENT:  Right Ear: External ear normal.  Left Ear: External ear normal.  Nose: Mucosal edema present. Right sinus exhibits no maxillary sinus tenderness and no frontal sinus  tenderness. Left sinus exhibits no maxillary sinus tenderness and no frontal sinus tenderness.  Mouth/Throat: Oropharynx is clear and moist. No oropharyngeal exudate.  Eyes: Conjunctivae are normal. Pupils are equal, round, and reactive to light.  Cardiovascular: Regular rhythm and normal heart sounds.   Pulmonary/Chest: Effort normal and breath sounds normal. She has no rales.  Neurological: She is alert and oriented to person, place, and time.  Skin: Skin is warm and dry. No rash noted. She is not diaphoretic. No erythema.  Psychiatric: Her behavior is normal.    Results for orders placed or performed in visit on 11/12/15 (from the past 72 hour(s))  POCT rapid strep A     Status: Normal   Collection Time: 11/12/15 12:23 PM  Result Value Ref Range   Rapid Strep A Screen Negative Negative    No results found.  ASSESSMENT AND PLAN  Lisa Le was seen today for sore throat and ear pain.  Diagnoses and all orders for this visit:  Sore throat -     POCT rapid strep A -     Culture, Group A Strep -     amoxicillin (AMOXIL) 875 MG tablet; Take 1 tablet (875 mg total) by mouth 2 (two) times daily. Please try symptomatic therapy for the next 3-4 days before filling this medication.  Viral URI: Advised her to wait to fill amox until the culture results or she fails symptomatic therapy.  Medication suggestions provided via AVS.    The patient was advised to call or return to  clinic if she does not see an improvement in symptoms or to seek the care of the closest emergency department if she worsens with the above plan.   Lisa Le, MHS, PA-C [facility] 11/12/2015 5:40 PM

## 2015-11-12 NOTE — Patient Instructions (Addendum)
-   You have a viral upper respiratory infection, (the common cold) and it is not uncommon for symptoms to last 10 days to 2 weeks, regardless of which medications you take. Unfortunately antibiotics will not help your symptoms as they target bacteria, and you are likely suffering from a virus. Additionally, 1 in 8 people who take antibiotics suffer mild to severe side effects.    - You would benefit from high dose ibuprofen. TAKE 600-800 mg of ibuprofen every 8 hours as needed to control low grade fever, fatigue, and pain.    - I also recommend that you take Zyrtec-D 5-120 every morning or Claritin-D 10-240 for the next five to 10 days. This medication will help you with nasal congestion, post nasal drip and sneezing. You will have to purchase this directly from the pharmacist   Viral URI Medications: Please consult the pharmacist if you have questions. 600-800 mg ibuprofen every 8 hours as needed for the next five to ten days 5-120 Zyrtec-D every morning for five to 10 days OR Claritin D 10-240 every morning for five days.  Please visit the CDC's website https://www.cdc.gov/getsmart/ to learn about appropriate and inappropriate uses of antibiotics.      IF you received an x-ray today, you will receive an invoice from [facility]. Please contact [facility] at [phone] with questions or concerns regarding your invoice.   IF you received labwork today, you will receive an invoice from [facility]. Please contact [facility] at [phone] with questions or concerns regarding your invoice.   Our billing staff will not be able to assist you with questions regarding bills from these companies.  You will be contacted with the lab results as soon as they are available. The fastest way to get your results is to activate your My Chart account. Instructions are located on the last page of this paperwork. If you have not heard from us regarding the results in  2 weeks, please contact this office.      

## 2015-11-14 LAB — CULTURE, GROUP A STREP: Organism ID, Bacteria: NORMAL

## 2015-11-28 ENCOUNTER — Other Ambulatory Visit (HOSPITAL_COMMUNITY): Payer: Self-pay | Admitting: Orthopedic Surgery

## 2015-11-28 DIAGNOSIS — S82892A Other fracture of left lower leg, initial encounter for closed fracture: Secondary | ICD-10-CM

## 2015-12-01 ENCOUNTER — Ambulatory Visit (HOSPITAL_COMMUNITY)
Admission: RE | Admit: 2015-12-01 | Discharge: 2015-12-01 | Disposition: A | Discharge status: Home / Self Care | Payer: PRIVATE HEALTH INSURANCE | Source: Ambulatory Visit | Attending: Orthopedic Surgery | Admitting: Orthopedic Surgery

## 2015-12-01 DIAGNOSIS — S82852K Displaced trimalleolar fracture of left lower leg, subsequent encounter for closed fracture with nonunion: Secondary | ICD-10-CM | POA: Diagnosis present

## 2015-12-01 DIAGNOSIS — X58XXXD Exposure to other specified factors, subsequent encounter: Secondary | ICD-10-CM | POA: Insufficient documentation

## 2015-12-01 DIAGNOSIS — S82892A Other fracture of left lower leg, initial encounter for closed fracture: Secondary | ICD-10-CM

## 2016-01-12 MED FILL — DICLOFENAC SODIUM 1% GEL: 1 | 30 days supply | Qty: 500 | Fill #0

## 2016-03-22 ENCOUNTER — Ambulatory Visit (INDEPENDENT_AMBULATORY_CARE_PROVIDER_SITE_OTHER): Payer: BLUE CROSS/BLUE SHIELD | Admitting: Physician Assistant

## 2016-03-22 VITALS — BP 135/80 | HR 92 | Temp 97.9°F | Resp 16 | Ht 68.0 in | Wt 282.0 lb

## 2016-03-22 DIAGNOSIS — J029 Acute pharyngitis, unspecified: Secondary | ICD-10-CM | POA: Diagnosis not present

## 2016-03-22 LAB — POCT RAPID STREP A (OFFICE): Rapid Strep A Screen: NEGATIVE

## 2016-03-22 MED ORDER — AMOXICILLIN 875 MG PO TABS: 875.0000 mg | ORAL_TABLET | Freq: Two times a day (BID) | ORAL | 0 refills | Status: DC

## 2016-03-22 NOTE — Patient Instructions (Addendum)
 -   You would benefit from high dose ibuprofen. TAKE 600-800 mg of ibuprofen every 8 hours as needed to control low grade fever, fatigue, and pain.    - I also recommend that you take Zyrtec-D 5-120 every morning or Claritin-D 10-240 for the next five to 10 days. This medication will help you with nasal congestion, post nasal drip and sneezing. You will have to purchase this directly from the pharmacist       IF you received an x-ray today, you will receive an invoice from [facility]. Please contact [facility] at [phone] with questions or concerns regarding your invoice.   IF you received labwork today, you will receive an invoice from [facility]. Please contact [facility] at [phone] with questions or concerns regarding your invoice.   Our billing staff will not be able to assist you with questions regarding bills from these companies.  You will be contacted with the lab results as soon as they are available. The fastest way to get your results is to activate your My Chart account. Instructions are located on the last page of this paperwork. If you have not heard from us regarding the results in 2 weeks, please contact this office.

## 2016-03-22 NOTE — Progress Notes (Signed)
   03/22/2016 4:43 PM   DOB: 12/19/1980 / MRN: [id number]  SUBJECTIVE:  Lisa Le is a 35 y.o. female presenting for sore throat and right ear pain that started yesterday.  Associates nasal congestion that is also new.  Denies fever, chills, nausea, facial pain, teeth pain, nausea.   She is allergic to latex.   She  has a past medical history of Constipation; Headache; and Heart palpitations (2012).    She  reports that she has never smoked. She has never used smokeless tobacco. She reports that she does not drink alcohol or use drugs. She  has no sexual activity history on file. The patient  has a past surgical history that includes No past surgeries and ORIF ankle fracture (Left, 12/27/2014).  Her family history includes Heart disease in her mother; Hypertension in her mother.  Review of Systems  Constitutional: Negative for chills and fever.  Respiratory: Negative for cough.   Cardiovascular: Negative for chest pain.  Gastrointestinal: Negative for nausea.  Genitourinary: Negative for dysuria.  Skin: Negative for itching and rash.  Neurological: Negative for dizziness and headaches.  Psychiatric/Behavioral: Negative for depression.    The problem list and medications were reviewed and updated by myself where necessary and exist elsewhere in the encounter.   OBJECTIVE:  BP 135/80 (BP Location: Left Arm, Patient Position: Sitting, Cuff Size: Large)   Pulse 92   Temp 97.9 F (36.6 C) (Oral)   Resp 16   Ht 5\' 8"  (1.727 m)   Wt 282 lb (127.9 kg)   LMP 03/11/2016   SpO2 97%   BMI 42.88 kg/m   Pulse Readings from Last 3 Encounters:  03/22/16 92  11/12/15 96  09/20/15 83   Physical Exam  Constitutional: She is oriented to person, place, and time. She appears well-developed and well-nourished. No distress.  Cardiovascular: Normal rate and regular rhythm.   Pulmonary/Chest: Effort normal and breath sounds normal.  Abdominal: Soft. Bowel sounds are normal.    Musculoskeletal: Normal range of motion.  Neurological: She is alert and oriented to person, place, and time.  Skin: She is not diaphoretic.    Results for orders placed or performed in visit on 03/22/16 (from the past 72 hour(s))  Rapid Strep A     Status: None   Collection Time: 03/22/16  4:43 PM  Result Value Ref Range   Rapid Strep A Screen Negative Negative    No results found.  ASSESSMENT AND PLAN  Lisa Le was seen today for sore throat.  Diagnoses and all orders for this visit:  Sore throat: Likely viral.  Advised she hold the antibiotic for now.  Try symptomatic therapy per AVS.   -     Rapid Strep A -     Culture, Group A Strep -     amoxicillin (AMOXIL) 875 MG tablet; Take 1 tablet (875 mg total) by mouth 2 (two) times daily. Please try symptomatic therapy for the next 3-4 days before filling this medication.    The patient is advised to call or return to clinic if she does not see an improvement in symptoms, or to seek the care of the closest emergency department if she worsens with the above plan.   Deliah BostonMichael Jamerica Snavely, MHS, PA-C [facility] 03/22/2016 4:43 PM

## 2016-03-24 LAB — CULTURE, GROUP A STREP: ORGANISM ID, BACTERIA: NORMAL

## 2016-05-27 ENCOUNTER — Other Ambulatory Visit: Payer: Self-pay | Admitting: Orthopedic Surgery

## 2016-06-09 ENCOUNTER — Ambulatory Visit (HOSPITAL_COMMUNITY)
Admission: EM | Admit: 2016-06-09 | Discharge: 2016-06-09 | Disposition: A | Discharge status: Home / Self Care | Payer: BLUE CROSS/BLUE SHIELD | Attending: Emergency Medicine | Admitting: Emergency Medicine

## 2016-06-09 ENCOUNTER — Encounter (HOSPITAL_COMMUNITY): Payer: Self-pay | Admitting: Family Medicine

## 2016-06-09 DIAGNOSIS — H60501 Unspecified acute noninfective otitis externa, right ear: Secondary | ICD-10-CM

## 2016-06-09 MED ORDER — NEOMYCIN-POLYMYXIN-HC 3.5-10000-1 OT SUSP: 4.0000 [drp] | Freq: Four times a day (QID) | OTIC | 0 refills | Status: DC

## 2016-06-09 NOTE — ED Triage Notes (Signed)
Pt here for right ear pain, heat to ear and drainage.

## 2016-06-09 NOTE — Discharge Instructions (Signed)
You have an infection of the ear canal. Use Cortisporin 4 times a day for the next week. You should see improvement within 2 days of starting the medicine. If you have persistent drainage after finishing the antibiotic drops, please follow-up with Dr. Pollyann Kennedyosen. He is an ENT specialist.

## 2016-06-09 NOTE — ED Provider Notes (Signed)
[facility]    CSN: [id number] Arrival date & time: 06/09/16  1405     History   Chief Complaint Chief Complaint  Patient presents with  . Otalgia    HPI Lisa Le is a 35 y.o. female.   HPI  She is a 35 year old woman here for evaluation of right ear pain. Her pain started 1-2 days ago. It is associated with drainage and feeling of warmth from the ear. She states she had similar symptoms a year ago and was diagnosed with ear infection. She used the eardrops that her symptoms resolved, but she has continued to have intermittent drainage from the ear. She reports some mild URI type symptoms earlier in the week, but none currently.  Past Medical History:  Diagnosis Date  . Constipation   . Headache   . Heart palpitations 2012    Patient Active Problem List   Diagnosis Date Noted  . Trimalleolar fracture of left ankle 12/27/2014  . Trimalleolar fracture of ankle, closed 12/27/2014    Past Surgical History:  Procedure Laterality Date  . NO PAST SURGERIES    . ORIF ANKLE FRACTURE Left 12/27/2014   Procedure: OPEN REDUCTION INTERNAL FIXATION (ORIF) LEFT ANKLE FRACTURE;  Surgeon: Kathryne Hitchhristopher Y Blackman, MD;  Location: [facility] OR;  Service: Orthopedics;  Laterality: Left;    OB History    No data available       Home Medications    Prior to Admission medications   Medication Sig Start Date End Date Taking? Authorizing Provider  neomycin-polymyxin-hydrocortisone (CORTISPORIN) 3.5-10000-1 otic suspension Place 4 drops into the right ear 4 (four) times daily. For 7 days 06/09/16   Charm RingsErin J Fonnie Crookshanks, MD    Family History Family History  Problem Relation Age of Onset  . Heart disease Mother   . Hypertension Mother     Social History Social History  Substance Use Topics  . Smoking status: Never Smoker  . Smokeless tobacco: Never Used  . Alcohol use No     Allergies   Latex   Review of Systems Review of Systems As in history of present  illness  Physical Exam Triage Vital Signs ED Triage Vitals  Enc Vitals Group     BP 06/09/16 1439 132/88     Pulse Rate 06/09/16 1439 86     Resp 06/09/16 1439 16     Temp 06/09/16 1439 98.1 F (36.7 C)     Temp Source 06/09/16 1439 Oral     SpO2 06/09/16 1439 96 %     Weight --      Height --      Head Circumference --      Peak Flow --      Pain Score 06/09/16 1438 7     Pain Loc --      Pain Edu? --      Excl. in GC? --    No data found.   Updated Vital Signs BP 132/88   Pulse 86   Temp 98.1 F (36.7 C) (Oral)   Resp 16   LMP 06/09/2016   SpO2 96%   Visual Acuity Right Eye Distance:   Left Eye Distance:   Bilateral Distance:    Right Eye Near:   Left Eye Near:    Bilateral Near:     Physical Exam  Constitutional: She is oriented to person, place, and time. She appears well-developed and well-nourished. No distress.  HENT:  Right ear canal is erythematous and swollen. No drainage seen. Visualized  portion of TM is normal. Pain with manipulation of the pinna.  Cardiovascular: Normal rate.   Pulmonary/Chest: Effort normal.  Neurological: She is alert and oriented to person, place, and time.     UC Treatments / Results  Labs (all labs ordered are listed, but only abnormal results are displayed) Labs Reviewed - No data to display  EKG  EKG Interpretation None       Radiology No results found.  Procedures Procedures (including critical care time)  Medications Ordered in UC Medications - No data to display   Initial Impression / Assessment and Plan / UC Course  I have reviewed the triage vital signs and the nursing notes.  Pertinent labs & imaging results that were available during my care of the patient were reviewed by me and considered in my medical decision making (see chart for details).  Clinical Course     We'll treat with Cortisporin drops. If she has persistent drainage after using antibiotics, follow-up with Dr. Pollyann Kennedyosen.  Final  Clinical Impressions(s) / UC Diagnoses   Final diagnoses:  Acute otitis externa of right ear, unspecified type    New Prescriptions New Prescriptions   NEOMYCIN-POLYMYXIN-HYDROCORTISONE (CORTISPORIN) 3.5-10000-1 OTIC SUSPENSION    Place 4 drops into the right ear 4 (four) times daily. For 7 days     Charm RingsErin J Ellowyn Rieves, MD 06/09/16 [phone]

## 2016-07-05 DIAGNOSIS — Z969 Presence of functional implant, unspecified: Secondary | ICD-10-CM

## 2016-07-05 HISTORY — DX: Presence of functional implant, unspecified: Z96.9

## 2016-07-19 ENCOUNTER — Encounter (HOSPITAL_BASED_OUTPATIENT_CLINIC_OR_DEPARTMENT_OTHER): Payer: Self-pay | Admitting: *Deleted

## 2016-07-25 ENCOUNTER — Ambulatory Visit (HOSPITAL_BASED_OUTPATIENT_CLINIC_OR_DEPARTMENT_OTHER)
Admission: RE | Admit: 2016-07-25 | Discharge: 2016-07-25 | Disposition: A | Discharge status: Home / Self Care | Payer: PRIVATE HEALTH INSURANCE | Source: Ambulatory Visit | Attending: Orthopedic Surgery | Admitting: Orthopedic Surgery

## 2016-07-25 ENCOUNTER — Ambulatory Visit (HOSPITAL_BASED_OUTPATIENT_CLINIC_OR_DEPARTMENT_OTHER): Payer: PRIVATE HEALTH INSURANCE | Admitting: Anesthesiology

## 2016-07-25 ENCOUNTER — Encounter (HOSPITAL_BASED_OUTPATIENT_CLINIC_OR_DEPARTMENT_OTHER): Payer: Self-pay | Admitting: Anesthesiology

## 2016-07-25 ENCOUNTER — Encounter (HOSPITAL_BASED_OUTPATIENT_CLINIC_OR_DEPARTMENT_OTHER)
Admission: RE | Disposition: A | Discharge status: Home / Self Care | Payer: Self-pay | Source: Ambulatory Visit | Attending: Orthopedic Surgery

## 2016-07-25 DIAGNOSIS — X58XXXS Exposure to other specified factors, sequela: Secondary | ICD-10-CM | POA: Diagnosis not present

## 2016-07-25 DIAGNOSIS — T8484XA Pain due to internal orthopedic prosthetic devices, implants and grafts, initial encounter: Secondary | ICD-10-CM | POA: Insufficient documentation

## 2016-07-25 DIAGNOSIS — Z91040 Latex allergy status: Secondary | ICD-10-CM | POA: Insufficient documentation

## 2016-07-25 DIAGNOSIS — M659 Synovitis and tenosynovitis, unspecified: Secondary | ICD-10-CM | POA: Diagnosis not present

## 2016-07-25 DIAGNOSIS — Z9889 Other specified postprocedural states: Secondary | ICD-10-CM

## 2016-07-25 DIAGNOSIS — S82852S Displaced trimalleolar fracture of left lower leg, sequela: Secondary | ICD-10-CM | POA: Insufficient documentation

## 2016-07-25 DIAGNOSIS — Z6841 Body Mass Index (BMI) 40.0 and over, adult: Secondary | ICD-10-CM | POA: Diagnosis not present

## 2016-07-25 DIAGNOSIS — Z8249 Family history of ischemic heart disease and other diseases of the circulatory system: Secondary | ICD-10-CM | POA: Insufficient documentation

## 2016-07-25 DIAGNOSIS — M19172 Post-traumatic osteoarthritis, left ankle and foot: Secondary | ICD-10-CM | POA: Insufficient documentation

## 2016-07-25 HISTORY — DX: Dental restoration status: Z98.811

## 2016-07-25 HISTORY — PX: HARDWARE REMOVAL: SHX979

## 2016-07-25 HISTORY — DX: Post-traumatic osteoarthritis, left ankle and foot: M19.172

## 2016-07-25 HISTORY — DX: Presence of functional implant, unspecified: Z96.9

## 2016-07-25 HISTORY — DX: Tension-type headache, unspecified, not intractable: G44.209

## 2016-07-25 HISTORY — DX: Adverse effect of unspecified anesthetic, initial encounter: T41.45XA

## 2016-07-25 HISTORY — DX: Other complications of anesthesia, initial encounter: T88.59XA

## 2016-07-25 HISTORY — PX: ANKLE ARTHROSCOPY: SHX545

## 2016-07-25 SURGERY — REMOVAL, HARDWARE
Anesthesia: Regional | Site: Ankle | Laterality: Left

## 2016-07-25 MED ORDER — HYDROMORPHONE HCL 1 MG/ML IJ SOLN
INTRAMUSCULAR | Status: AC
  Filled 2016-07-25: qty 1

## 2016-07-25 MED ORDER — SENNA 8.6 MG PO TABS: 2.0000 | ORAL_TABLET | Freq: Two times a day (BID) | ORAL | 0 refills | Status: DC

## 2016-07-25 MED ORDER — BUPIVACAINE HCL (PF) 0.5 % IJ SOLN
INTRAMUSCULAR | Status: DC | PRN
  Administered 2016-07-25: 10 mL

## 2016-07-25 MED ORDER — OXYCODONE HCL 5 MG PO TABS: 5.0000 mg | ORAL_TABLET | ORAL | 0 refills | Status: DC | PRN

## 2016-07-25 MED ORDER — FENTANYL CITRATE (PF) 100 MCG/2ML IJ SOLN
INTRAMUSCULAR | Status: AC
  Filled 2016-07-25: qty 2

## 2016-07-25 MED ORDER — HYDROCODONE-ACETAMINOPHEN 7.5-325 MG PO TABS: 1.0000 | ORAL_TABLET | Freq: Once | ORAL | Status: DC | PRN

## 2016-07-25 MED ORDER — SCOPOLAMINE 1 MG/3DAYS TD PT72: 1.0000 | MEDICATED_PATCH | Freq: Once | TRANSDERMAL | Status: DC | PRN

## 2016-07-25 MED ORDER — HYDROMORPHONE HCL 1 MG/ML IJ SOLN
0.2500 mg | INTRAMUSCULAR | Status: DC | PRN
  Administered 2016-07-25: 0.5 mg via INTRAVENOUS
  Administered 2016-07-25 (×2): 0.25 mg via INTRAVENOUS

## 2016-07-25 MED ORDER — SODIUM CHLORIDE 0.9 % IR SOLN
Status: DC | PRN
  Administered 2016-07-25: 1

## 2016-07-25 MED ORDER — METOCLOPRAMIDE HCL 5 MG/ML IJ SOLN
INTRAMUSCULAR | Status: AC
  Filled 2016-07-25: qty 2

## 2016-07-25 MED ORDER — FENTANYL CITRATE (PF) 100 MCG/2ML IJ SOLN
50.0000 ug | INTRAMUSCULAR | Status: DC | PRN
  Administered 2016-07-25: 50 ug via INTRAVENOUS
  Administered 2016-07-25: 100 ug via INTRAVENOUS

## 2016-07-25 MED ORDER — CEFAZOLIN IN D5W 1 GM/50ML IV SOLN
INTRAVENOUS | Status: AC
  Filled 2016-07-25: qty 50

## 2016-07-25 MED ORDER — DEXAMETHASONE SODIUM PHOSPHATE 10 MG/ML IJ SOLN
INTRAMUSCULAR | Status: AC
  Filled 2016-07-25: qty 1

## 2016-07-25 MED ORDER — PROPOFOL 10 MG/ML IV BOLUS
INTRAVENOUS | Status: DC | PRN
  Administered 2016-07-25: 200 mg via INTRAVENOUS

## 2016-07-25 MED ORDER — LACTATED RINGERS IV SOLN
INTRAVENOUS | Status: DC
  Administered 2016-07-25 (×2): via INTRAVENOUS

## 2016-07-25 MED ORDER — MEPERIDINE HCL 25 MG/ML IJ SOLN: 6.2500 mg | INTRAMUSCULAR | Status: DC | PRN

## 2016-07-25 MED ORDER — DEXAMETHASONE SODIUM PHOSPHATE 10 MG/ML IJ SOLN
INTRAMUSCULAR | Status: DC | PRN
  Administered 2016-07-25: 10 mg via INTRAVENOUS

## 2016-07-25 MED ORDER — LIDOCAINE 2% (20 MG/ML) 5 ML SYRINGE
INTRAMUSCULAR | Status: AC
  Filled 2016-07-25: qty 5

## 2016-07-25 MED ORDER — SODIUM CHLORIDE 0.9 % IV SOLN: INTRAVENOUS | Status: DC

## 2016-07-25 MED ORDER — PROPOFOL 500 MG/50ML IV EMUL
INTRAVENOUS | Status: AC
  Filled 2016-07-25: qty 50

## 2016-07-25 MED ORDER — CEFAZOLIN SODIUM-DEXTROSE 2-4 GM/100ML-% IV SOLN
INTRAVENOUS | Status: AC
  Filled 2016-07-25: qty 200

## 2016-07-25 MED ORDER — CEFAZOLIN SODIUM-DEXTROSE 2-4 GM/100ML-% IV SOLN
2.0000 g | INTRAVENOUS | Status: AC
  Administered 2016-07-25: 3 g via INTRAVENOUS

## 2016-07-25 MED ORDER — MIDAZOLAM HCL 2 MG/2ML IJ SOLN
INTRAMUSCULAR | Status: AC
  Filled 2016-07-25: qty 2

## 2016-07-25 MED ORDER — MIDAZOLAM HCL 2 MG/2ML IJ SOLN
1.0000 mg | INTRAMUSCULAR | Status: DC | PRN
  Administered 2016-07-25: 2 mg via INTRAVENOUS

## 2016-07-25 MED ORDER — CHLORHEXIDINE GLUCONATE 4 % EX LIQD: 60.0000 mL | Freq: Once | CUTANEOUS | Status: DC

## 2016-07-25 MED ORDER — ONDANSETRON HCL 4 MG/2ML IJ SOLN
INTRAMUSCULAR | Status: AC
  Filled 2016-07-25: qty 2

## 2016-07-25 MED ORDER — METOCLOPRAMIDE HCL 5 MG/ML IJ SOLN
10.0000 mg | Freq: Once | INTRAMUSCULAR | Status: AC | PRN
  Administered 2016-07-25: 10 mg via INTRAVENOUS

## 2016-07-25 MED ORDER — BUPIVACAINE-EPINEPHRINE (PF) 0.5% -1:200000 IJ SOLN
INTRAMUSCULAR | Status: DC | PRN
  Administered 2016-07-25: 30 mL via PERINEURAL

## 2016-07-25 MED ORDER — DOCUSATE SODIUM 100 MG PO CAPS: 100.0000 mg | ORAL_CAPSULE | Freq: Two times a day (BID) | ORAL | 0 refills | Status: DC

## 2016-07-25 MED ORDER — LIDOCAINE 2% (20 MG/ML) 5 ML SYRINGE
INTRAMUSCULAR | Status: DC | PRN
  Administered 2016-07-25: 80 mg via INTRAVENOUS

## 2016-07-25 MED FILL — oxyCODONE HCL 5 MG TABS: 5 | 2 days supply | Qty: 30 | Fill #0

## 2016-07-25 SURGICAL SUPPLY — 88 items
BANDAGE ACE 4X5 VEL STRL LF (GAUZE/BANDAGES/DRESSINGS) ×2 IMPLANT
BANDAGE ESMARK 6X9 LF (GAUZE/BANDAGES/DRESSINGS) ×1 IMPLANT
BENZOIN TINCTURE PRP APPL 2/3 (GAUZE/BANDAGES/DRESSINGS) IMPLANT
BLADE CUDA 2.0 (BLADE) IMPLANT
BLADE CUDA GRT WHITE 3.5 (BLADE) IMPLANT
BLADE CUDA SHAVER 3.5 (BLADE) IMPLANT
BLADE CUTTER GATOR 3.5 (BLADE) IMPLANT
BLADE SURG 15 STRL LF DISP TIS (BLADE) ×1 IMPLANT
BLADE SURG 15 STRL SS (BLADE) ×1
BNDG COHESIVE 4X5 TAN STRL (GAUZE/BANDAGES/DRESSINGS) IMPLANT
BNDG COHESIVE 6X5 TAN STRL LF (GAUZE/BANDAGES/DRESSINGS) IMPLANT
BNDG ESMARK 4X9 LF (GAUZE/BANDAGES/DRESSINGS) IMPLANT
BNDG ESMARK 6X9 LF (GAUZE/BANDAGES/DRESSINGS) ×2
BOOT STEPPER DURA LG (SOFTGOODS) ×2 IMPLANT
BOOT STEPPER DURA MED (SOFTGOODS) IMPLANT
BOOT STEPPER DURA SM (SOFTGOODS) IMPLANT
BUR 3.5 LG SPHERICAL (BURR) IMPLANT
BUR CUDA 2.9 (BURR) IMPLANT
BUR FULL RADIUS 2.9 (BURR) IMPLANT
BUR GATOR 2.9 (BURR) ×2 IMPLANT
BUR OVAL 4.0 (BURR) IMPLANT
BUR SPHERICAL 2.9 (BURR) IMPLANT
BUR VERTEX HOODED 4.5 (BURR) IMPLANT
BURR 3.5 LG SPHERICAL (BURR)
CHLORAPREP W/TINT 26ML (MISCELLANEOUS) ×2 IMPLANT
CUFF TOURNIQUET SINGLE 34IN LL (TOURNIQUET CUFF) IMPLANT
CUFF TOURNIQUET SINGLE 44IN (TOURNIQUET CUFF) ×2 IMPLANT
DECANTER SPIKE VIAL GLASS SM (MISCELLANEOUS) IMPLANT
DRAPE EXTREMITY T 121X128X90 (DRAPE) ×2 IMPLANT
DRAPE OEC MINIVIEW 54X84 (DRAPES) ×2 IMPLANT
DRAPE SURG 17X23 STRL (DRAPES) IMPLANT
DRAPE U-SHAPE 47X51 STRL (DRAPES) ×2 IMPLANT
DRSG MEPITEL 4X7.2 (GAUZE/BANDAGES/DRESSINGS) ×2 IMPLANT
DRSG PAD ABDOMINAL 8X10 ST (GAUZE/BANDAGES/DRESSINGS) ×2 IMPLANT
ELECT REM PT RETURN 9FT ADLT (ELECTROSURGICAL) ×2
ELECTRODE REM PT RTRN 9FT ADLT (ELECTROSURGICAL) ×1 IMPLANT
GAUZE SPONGE 4X4 12PLY STRL (GAUZE/BANDAGES/DRESSINGS) ×2 IMPLANT
GLOVE BIOGEL PI IND STRL 7.0 (GLOVE) ×1 IMPLANT
GLOVE BIOGEL PI IND STRL 8 (GLOVE) ×2 IMPLANT
GLOVE BIOGEL PI INDICATOR 7.0 (GLOVE) ×1
GLOVE BIOGEL PI INDICATOR 8 (GLOVE) ×2
GLOVE SURG SS PI 6.5 STRL IVOR (GLOVE) ×2 IMPLANT
GLOVE SURG SS PI 7.5 STRL IVOR (GLOVE) ×6 IMPLANT
GLOVE SURG SS PI 8.0 STRL IVOR (GLOVE) ×2 IMPLANT
GOWN STRL REUS W/ TWL LRG LVL3 (GOWN DISPOSABLE) ×1 IMPLANT
GOWN STRL REUS W/ TWL XL LVL3 (GOWN DISPOSABLE) ×2 IMPLANT
GOWN STRL REUS W/TWL LRG LVL3 (GOWN DISPOSABLE) ×1
GOWN STRL REUS W/TWL XL LVL3 (GOWN DISPOSABLE) ×2
K-WIRE SGLE END .054 LG (WIRE) ×2
KWIRE SGLE END .054 LG (WIRE) ×1 IMPLANT
MANIFOLD NEPTUNE II (INSTRUMENTS) ×2 IMPLANT
NEEDLE HYPO 22GX1.5 SAFETY (NEEDLE) IMPLANT
NS IRRIG 1000ML POUR BTL (IV SOLUTION) IMPLANT
PACK ARTHROSCOPY DSU (CUSTOM PROCEDURE TRAY) ×2 IMPLANT
PACK BASIN DAY SURGERY FS (CUSTOM PROCEDURE TRAY) ×2 IMPLANT
PAD CAST 4YDX4 CTTN HI CHSV (CAST SUPPLIES) ×1 IMPLANT
PADDING CAST ABS 4INX4YD NS (CAST SUPPLIES)
PADDING CAST ABS COTTON 4X4 ST (CAST SUPPLIES) IMPLANT
PADDING CAST COTTON 4X4 STRL (CAST SUPPLIES) ×1
PADDING CAST COTTON 6X4 STRL (CAST SUPPLIES) IMPLANT
PENCIL BUTTON HOLSTER BLD 10FT (ELECTRODE) ×2 IMPLANT
PROBE BIPOLAR ATHRO 135MM 90D (MISCELLANEOUS) IMPLANT
SANITIZER HAND PURELL 535ML FO (MISCELLANEOUS) ×2 IMPLANT
SET ARTHROSCOPY TUBING (MISCELLANEOUS) ×1
SET ARTHROSCOPY TUBING LN (MISCELLANEOUS) ×1 IMPLANT
SET IRRIG Y TYPE TUR BLADDER L (SET/KITS/TRAYS/PACK) IMPLANT
SHEET MEDIUM DRAPE 40X70 STRL (DRAPES) ×2 IMPLANT
SLEEVE SCD COMPRESS KNEE MED (MISCELLANEOUS) ×2 IMPLANT
SPLINT FAST PLASTER 5X30 (CAST SUPPLIES)
SPLINT PLASTER CAST FAST 5X30 (CAST SUPPLIES) IMPLANT
SPONGE LAP 18X18 X RAY DECT (DISPOSABLE) ×2 IMPLANT
STOCKINETTE 6  STRL (DRAPES) ×1
STOCKINETTE 6 STRL (DRAPES) ×1 IMPLANT
STRAP ANKLE FOOT DISTRACTOR (ORTHOPEDIC SUPPLIES) ×2 IMPLANT
STRIP CLOSURE SKIN 1/2X4 (GAUZE/BANDAGES/DRESSINGS) IMPLANT
SUCTION FRAZIER HANDLE 10FR (MISCELLANEOUS) ×1
SUCTION TUBE FRAZIER 10FR DISP (MISCELLANEOUS) ×1 IMPLANT
SUT ETHILON 3 0 PS 1 (SUTURE) ×2 IMPLANT
SUT MNCRL AB 3-0 PS2 18 (SUTURE) ×2 IMPLANT
SUT VIC AB 0 SH 27 (SUTURE) IMPLANT
SUT VIC AB 2-0 SH 27 (SUTURE)
SUT VIC AB 2-0 SH 27XBRD (SUTURE) IMPLANT
SYR BULB 3OZ (MISCELLANEOUS) ×2 IMPLANT
SYR CONTROL 10ML LL (SYRINGE) IMPLANT
TOWEL OR 17X24 6PK STRL BLUE (TOWEL DISPOSABLE) ×4 IMPLANT
TUBE CONNECTING 20X1/4 (TUBING) IMPLANT
UNDERPAD 30X30 (UNDERPADS AND DIAPERS) ×2 IMPLANT
WATER STERILE IRR 1000ML POUR (IV SOLUTION) ×2 IMPLANT

## 2016-07-25 NOTE — Anesthesia Procedure Notes (Addendum)
Anesthesia Regional Block:  Popliteal block  Pre-Anesthetic Checklist: ,, timeout performed, Correct Patient, Correct Site, Correct Laterality, Correct Procedure, Correct Position, site marked, Risks and benefits discussed,  Surgical consent,  Pre-op evaluation,  At surgeon's request and post-op pain management  Laterality: Left  Prep: chloraprep       Needles:  Injection technique: Single-shot  Needle Type: Echogenic Stimulator Needle     Needle Length: 9cm 9 cm Needle Gauge: 21 and 21 G  Needle insertion depth: 6 cm   Additional Needles:  Procedures: ultrasound guided (picture in chart) Popliteal block Narrative:  Start time: 07/25/2016 8:42 AM End time: 07/25/2016 8:55 AM Injection made incrementally with aspirations every 5 mL.  Performed by: Personally  Anesthesiologist: Mal AmabileFOSTER, Anglia Blakley  Additional Notes: Saphenous Block performed with 10ml Bupivacaine 0.5%. Patient tolerated procedure well.

## 2016-07-25 NOTE — H&P (Signed)
Lisa Le is an 35 y.o. female.   Chief Complaint: left ankle pain HPI: 35 y/o female with left ankle pain after ORIF of an ankle fracture approx 18 months ago.  She has painful hardware in the ankle.  She has failed non op treatment and presents today for removal of the deep implants.  Past Medical History:  Diagnosis Date  . Complication of anesthesia    unable to urinate after ORIF ankle, had to insert Foley; had constipation x 1 week  . Dental crown present    tooth #9  . Post-traumatic arthritis of ankle, left 07/2016  . Retained orthopedic hardware 07/2016   left ankle  . Tension headache     Past Surgical History:  Procedure Laterality Date  . ORIF ANKLE FRACTURE Left 12/27/2014   Procedure: OPEN REDUCTION INTERNAL FIXATION (ORIF) LEFT ANKLE FRACTURE;  Surgeon: Kathryne Hitchhristopher Y Blackman, MD;  Location: [facility] OR;  Service: Orthopedics;  Laterality: Left;    Family History  Problem Relation Age of Onset  . Heart disease Mother   . Hypertension Mother    Social History:  reports that she has never smoked. She has never used smokeless tobacco. She reports that she does not drink alcohol or use drugs.  Allergies:  Allergies  Allergen Reactions  . Latex Swelling    SWELLING LIPS    Medications Prior to Admission  Medication Sig Dispense Refill  . ibuprofen (ADVIL,MOTRIN) 200 MG tablet Take 200 mg by mouth every 6 (six) hours as needed.    . naproxen (NAPROSYN) 250 MG tablet Take by mouth 2 (two) times daily with a meal.      No results found for this or any previous visit (from the past 48 hour(s)). No results found.  ROS  No recent f/c/n/v/wt loss  Blood pressure 126/89, pulse 87, temperature 98 F (36.7 C), temperature source Oral, resp. rate 18, height 5\' 9"  (1.753 m), weight 130.6 kg (288 lb), last menstrual period 07/06/2016, SpO2 99 %. Physical Exam  wn wd woman in nad.  A and O x 4.  Mood and affect normal.  EOMI.  Resp unlabored.  L ankle with healed surgical  incisions.  No lymphadenopathy.  5/5 strength in PF and DF of the ankle and toes.  Sens to LT intact at the foot dorsally and plantarly.  Pulses are palpable in the foot.  Assessment/Plan L ankle retained hardware - to OR for removal of the deep implants.  The risks and benefits of the alternative treatment options have been discussed in detail.  The patient wishes to proceed with surgery and specifically understands risks of bleeding, infection, nerve damage, blood clots, need for additional surgery, amputation and death.   Lisa Le, Lisa Wenger, MD 07/25/2016, 8:43 AM

## 2016-07-25 NOTE — Op Note (Signed)
NAMLeland Her:  Blanford, Jalecia             ACCOUNT NO.:  [id number]  MEDICAL RECORD NO.:  [id number]  LOCATION:                                 FACILITY:  PHYSICIAN:  Toni ArthursJohn Guiliana Shor, MD        DATE OF BIRTH:  [birth year]  DATE OF PROCEDURE:  07/25/2016 DATE OF DISCHARGE:                              OPERATIVE REPORT   PREOPERATIVE DIAGNOSES: 1. Left ankle posttraumatic arthritis. 2. Left fibula painful hardware. 3. Left medial malleolus painful hardware.  POSTOPERATIVE DIAGNOSES: 1. Left ankle posttraumatic arthritis. 2. Left fibula painful hardware. 3. Left medial malleolus painful hardware. 4. Left ankle synovitis at the anterior medial and lateral gutters.  PROCEDURE: 1. Left ankle arthroscopy with extensive debridement. 2. Left lateral malleolus removal of deep implant through a separate     incision. 3. Left medial malleolus removal of implant through a separate     incision. 4. AP and lateral radiographs of the left ankle.  SURGEON:  Toni ArthursJohn Mikalia Fessel, MD.  ASSISTANT:  Alfredo MartinezJustin Ollis, PA-C.  ANESTHESIA:  General, regional.  ESTIMATED BLOOD LOSS:  Minimal.  TOURNIQUET TIME:  34 minutes at 300 mmHg.  COMPLICATIONS:  None apparent.  DISPOSITION:  Extubated, awake, and stable to recovery.  INDICATIONS FOR PROCEDURE:  The patient is a 35 year old woman, who has a past medical history of a left ankle fracture.  She underwent ORIF approximately 18 months ago.  She continues to have pain at the ankle joint as well as medially and laterally at her hardware.  She has failed nonoperative treatment to date.  Presents for surgical treatment.  She understands the risks and benefits of the alternative treatment options and elects surgical treatment.  She specifically understands risks of bleeding, infection, nerve damage, blood clots, need for additional surgery, continued pain, nonunion, amputation, and death.  PROCEDURE IN DETAIL:  After preoperative consent was obtained and the correct  operative site was identified, the patient was brought to the operating room and placed supine on the operating table.  General anesthesia was induced.  Preoperative antibiotics were administered. Surgical time-out was taken.  Left lower extremity was prepped and draped in standard sterile fashion.  The traction device was applied with the thigh held in a padded holder.  The extremity was exsanguinated and the tourniquet was inflated to 300 mmHg.  An anteromedial arthroscopy portal was established using the nick and spread technique. The arthroscope was inserted into the ankle joint.  Immediately evident was significant synovitis of the anterolateral and anterior gutters.  An anterolateral arthroscopy portal was then established under direct vision again using the nick and spread technique.  The shaver was inserted into the ankle joint.  The lateral and anterior gutters were debrided carefully.  The arthroscope was then used to inspect the lateral gutter, there was no evidence of loose body.  There was grade 1 chondromalacia over the tibial plafond.  The talar dome had grade 1 and grade 2 chondromalacia diffusely.  Posterior gutter was clear of all loose body and synovitis.  The syndesmosis was stable.  The medial gutter had some fibrous tissue at the fracture line and also was noted to have synovitis.  The arthroscope was switched to  the lateral gutter, and the shaver was used to debride the fibrous tissue at the fracture line as well as the synovitis at the medial gutter.  No loose bodies were noted.  Arthroscopic instruments were removed and the incisions closed with horizontal mattress sutures of 3-0 nylon.  The leg was then taken out of the traction strap and the leg holder. The lateral incision was identified.  It was opened again sharply and dissection was carried down through the subcutaneous tissues to the superficial aspect of the plate.  Plate was debrided of all soft  tissue. All of the screws were removed, the plate was removed.  The lag screw was then identified and removed in its entirety as well.  The wound was irrigated and closed with Monocryl and nylon.  Attention was then turned to the medial incision over the medial malleolus.  An incision was made and blunt dissection was carried down to the head of the screw.  The screwdriver was used to remove the screw in its entirety.  The same procedure was repeated for the more posterior screw.  AP and lateral radiographs of the ankle then showed complete removal of all implants.  The medial wound was irrigated and closed with nylon.  Sterile dressings were applied followed by compression wrap and a Cam walker boot.  The tourniquet was released at 34 minutes.  The patient was awakened from anesthesia and transported to the recovery room in stable condition.  FOLLOWUP PLAN:  The patient will be weightbearing as tolerated on the left lower extremity.  She will follow up with me in the office in 2 weeks for suture removal and to initiate physical therapy.  X-RAYS:  AP and lateral radiographs of the left ankle were obtained intraoperatively.  These show interval removal of all metallic hardware from the left ankle.  No other acute injuries are noted.  Alfredo MartinezJustin Ollis, PA-C, was present and scrubbed for the duration of the case.  His assistance was essential in positioning the patient, prepping and draping, gaining and maintaining exposure, performing the operation, closing and dressing the wounds, and applying a boot.     Toni ArthursJohn Millan Legan, MD     JH/MEDQ  D:  07/25/2016  T:  07/25/2016  Job:  [id number]

## 2016-07-25 NOTE — Anesthesia Postprocedure Evaluation (Signed)
Anesthesia Post Note  Patient: Lisa Le  Procedure(s) Performed: Procedure(s) (LRB): LEFT ANKLE REMOVAL OF DEEP IMPLANTS (Left) LEFT ANKLE ARTHROSCOPY WITH EXTENSIVE DEBRIDEMENT (Left)  Patient location during evaluation: PACU Anesthesia Type: General Level of consciousness: awake and alert and oriented Pain management: pain level controlled Vital Signs Assessment: post-procedure vital signs reviewed and stable Respiratory status: spontaneous breathing, nonlabored ventilation and respiratory function stable Cardiovascular status: blood pressure returned to baseline and stable Postop Assessment: no signs of nausea or vomiting Anesthetic complications: no       Last Vitals:  Vitals:   07/25/16 1030 07/25/16 1045  BP: (!) 138/100 (!) 128/92  Pulse: 98 99  Resp: 14 17  Temp:      Last Pain:  Vitals:   07/25/16 1030  TempSrc:   PainSc: 10-Worst pain ever                 Matin Mattioli A.

## 2016-07-25 NOTE — Progress Notes (Signed)
Assisted Dr. Foster with left, ultrasound guided, popliteal block. Side rails up, monitors on throughout procedure. See vital signs in flow sheet. Tolerated Procedure well. 

## 2016-07-25 NOTE — Discharge Instructions (Addendum)
Post Anesthesia Home Care Instructions  Activity: Get plenty of rest for the remainder of the day. A responsible adult should stay with you for 24 hours following the procedure.  For the next 24 hours, DO NOT: -Drive a car -Advertising copywriterperate machinery -Drink alcoholic beverages -Take any medication unless instructed by your physician -Make any legal decisions or sign important papers.  Meals: Start with liquid foods such as gelatin or soup. Progress to regular foods as tolerated. Avoid greasy, spicy, heavy foods. If nausea and/or vomiting occur, drink only clear liquids until the nausea and/or vomiting subsides. Call your physician if vomiting continues.  Special Instructions/Symptoms: Your throat may feel dry or sore from the anesthesia or the breathing tube placed in your throat during surgery. If this causes discomfort, gargle with warm salt water. The discomfort should disappear within 24 hours.  If you had a scopolamine patch placed behind your ear for the management of post- operative nausea and/or vomiting:  1. The medication in the patch is effective for 72 hours, after which it should be removed.  Wrap patch in a tissue and discard in the trash. Wash hands thoroughly with soap and water. 2. You may remove the patch earlier than 72 hours if you experience unpleasant side effects which may include dry mouth, dizziness or visual disturbances. 3. Avoid touching the patch. Wash your hands with soap and water after contact with the patch.   Call your surgeon if you experience:   1.  Fever over 101.0. 2.  Inability to urinate. 3.  Nausea and/or vomiting. 4.  Extreme swelling or bruising at the surgical site. 5.  Continued bleeding from the incision. 6.  Increased pain, redness or drainage from the incision. 7.  Problems related to your pain medication. 8.  Any problems and/or Bennie DallasconcernsJohn Hewitt, MD [facility]  Please read the following information regarding your care after  surgery.  Medications  You only need a prescription for the narcotic pain medicine (ex. oxycodone, Percocet, Norco).  All of the other medicines listed below are available over the counter. X acetominophen (Tylenol) 650 mg every 4-6 hours as you need for minor pain X oxycodone as prescribed for moderate to severe pain X naprosyn 250 mg - 2 tablets twice daily with food as needed for pain.   Narcotic pain medicine (ex. oxycodone, Percocet, Vicodin) will cause constipation.  To prevent this problem, take the following medicines while you are taking any pain medicine. X docusate sodium (Colace) 100 mg twice a day X senna (Senokot) 2 tablets twice a day  Weight Bearing X Bear weight when you are able on your operated leg or foot in your CAM boot.  Dressing X Keep your dressing clean and dry.  Dont put anything (coat hanger, pencil, etc) down inside of it.  If it gets damp, use a hair dryer on the cool setting to dry it.  If it gets soaked, call the office to schedule an appointment for a cast change.    After your dressing, cast or splint is removed; you may shower, but do not soak or scrub the wound.  Allow the water to run over it, and then gently pat it dry.  Swelling It is normal for you to have swelling where you had surgery.  To reduce swelling and pain, keep your toes above your nose for at least 3 days after surgery.  It may be necessary to keep your foot or leg elevated for several weeks.  If it hurts, it should  be elevated.  Follow Up Call my office at [phone] when you are discharged from the hospital or surgery center to schedule an appointment to be seen two weeks after surgery.  Call my office at [phone] if you develop a fever >101.5 F, nausea, vomiting, bleeding from the surgical site or severe pain.

## 2016-07-25 NOTE — Anesthesia Procedure Notes (Signed)
Procedure Name: LMA Insertion Date/Time: 07/25/2016 9:11 AM Performed by: Burna CashONRAD, Lyriq Finerty C Pre-anesthesia Checklist: Patient identified, Emergency Drugs available, Suction available and Patient being monitored Patient Re-evaluated:Patient Re-evaluated prior to inductionOxygen Delivery Method: Circle system utilized Preoxygenation: Pre-oxygenation with 100% oxygen Intubation Type: IV induction Ventilation: Mask ventilation without difficulty LMA: LMA inserted LMA Size: 4.0 Number of attempts: 1 Airway Equipment and Method: Bite block Placement Confirmation: positive ETCO2 Tube secured with: Tape Dental Injury: Teeth and Oropharynx as per pre-operative assessment

## 2016-07-25 NOTE — Transfer of Care (Signed)
Immediate Anesthesia Transfer of Care Note  Patient: Lisa Le  Procedure(s) Performed: Procedure(s): LEFT ANKLE REMOVAL OF DEEP IMPLANTS (Left) LEFT ANKLE ARTHROSCOPY WITH EXTENSIVE DEBRIDEMENT (Left)  Patient Location: PACU  Anesthesia Type:GA combined with regional for post-op pain  Level of Consciousness: sedated  Airway & Oxygen Therapy: Patient Spontanous Breathing and Patient connected to face mask oxygen  Post-op Assessment: Report given to RN and Post -op Vital signs reviewed and stable  Post vital signs: Reviewed and stable  Last Vitals:  Vitals:   07/25/16 0855 07/25/16 0900  BP:  (!) 129/98  Pulse: (!) 109 99  Resp: 17 (!) 22  Temp:      Last Pain:  Vitals:   07/25/16 0801  TempSrc: Oral  PainSc: 5       Patients Stated Pain Goal: 1 (07/25/16 0801)  Complications: No apparent anesthesia complications

## 2016-07-25 NOTE — Brief Op Note (Signed)
07/25/2016  10:16 AM  PATIENT:  Lisa GrahamKarima Le  35 y.o. female  PRE-OPERATIVE DIAGNOSIS:  left ankle painful hardware and post-traumatic arthritis  POST-OPERATIVE DIAGNOSIS:  Same  Procedure(s): 1.  Left ankle arthroscopy with extensive debridement 2.  Left lateral malleolus removal of deep implants (separate incision) 3.  Left medial malleolus removal of deep implants (separate incision) 4.  AP and lateral xrays of the left ankle  SURGEON:  Toni ArthursJohn Danayah Smyre, MD  ASSISTANT:  Alfredo MartinezJustin Ollis, PA-C  ANESTHESIA:   General, regional  EBL:  minimal   TOURNIQUET:   Total Tourniquet Time Documented: Thigh (Left) - 34 minutes Total: Thigh (Left) - 34 minutes  COMPLICATIONS:  None apparent  DISPOSITION:  Extubated, awake and stable to recovery.  DICTATION ID:   [id number]

## 2016-07-25 NOTE — Anesthesia Preprocedure Evaluation (Signed)
Anesthesia Evaluation  Patient identified by MRN, date of birth, ID band Patient awake  General Assessment Comment:Urinary retention and constipation post op  Reviewed: Allergy & Precautions, NPO status , Patient's Chart, lab work & pertinent test results  History of Anesthesia Complications (+) history of anesthetic complications  Airway Mallampati: I       Dental no notable dental hx. (+) Teeth Intact   Pulmonary neg pulmonary ROS,    Pulmonary exam normal breath sounds clear to auscultation       Cardiovascular negative cardio ROS Normal cardiovascular exam Rhythm:Regular Rate:Normal     Neuro/Psych  Headaches, negative psych ROS   GI/Hepatic negative GI ROS, Neg liver ROS,   Endo/Other  Morbid obesity  Renal/GU negative Renal ROS  negative genitourinary   Musculoskeletal  (+) Arthritis , Hx/o Trimalleolar Fx left ankle S/P ORIF, now with post traumatic arthritis and painful hardware   Abdominal (+) + obese,   Peds  Hematology negative hematology ROS (+)   Anesthesia Other Findings   Reproductive/Obstetrics negative OB ROS                             Anesthesia Physical Anesthesia Plan  ASA: III  Anesthesia Plan: General and Regional   Post-op Pain Management:  Regional for Post-op pain   Induction: Intravenous  Airway Management Planned: LMA  Additional Equipment:   Intra-op Plan:   Post-operative Plan: Extubation in OR  Informed Consent: I have reviewed the patients History and Physical, chart, labs and discussed the procedure including the risks, benefits and alternatives for the proposed anesthesia with the patient or authorized representative who has indicated his/her understanding and acceptance.   Dental advisory given  Plan Discussed with: CRNA, Anesthesiologist and Surgeon  Anesthesia Plan Comments:         Anesthesia Quick Evaluation

## 2016-07-28 ENCOUNTER — Emergency Department (HOSPITAL_COMMUNITY): Payer: BLUE CROSS/BLUE SHIELD

## 2016-07-28 ENCOUNTER — Emergency Department (HOSPITAL_COMMUNITY)
Admission: EM | Admit: 2016-07-28 | Discharge: 2016-07-28 | Disposition: A | Discharge status: Home / Self Care | Payer: BLUE CROSS/BLUE SHIELD | Attending: Emergency Medicine | Admitting: Emergency Medicine

## 2016-07-28 ENCOUNTER — Encounter (HOSPITAL_COMMUNITY): Payer: Self-pay | Admitting: Oncology

## 2016-07-28 DIAGNOSIS — Z79899 Other long term (current) drug therapy: Secondary | ICD-10-CM | POA: Diagnosis not present

## 2016-07-28 DIAGNOSIS — Z91040 Latex allergy status: Secondary | ICD-10-CM | POA: Insufficient documentation

## 2016-07-28 DIAGNOSIS — R1013 Epigastric pain: Secondary | ICD-10-CM

## 2016-07-28 LAB — COMPREHENSIVE METABOLIC PANEL
ALK PHOS: 85 U/L (ref 38–126)
ALT: 19 U/L (ref 14–54)
ANION GAP: 9 (ref 5–15)
AST: 18 U/L (ref 15–41)
Albumin: 4.3 g/dL (ref 3.5–5.0)
BILIRUBIN TOTAL: 0.8 mg/dL (ref 0.3–1.2)
BUN: 12 mg/dL (ref 6–20)
CALCIUM: 8.6 mg/dL — AB (ref 8.9–10.3)
CO2: 24 mmol/L (ref 22–32)
Chloride: 103 mmol/L (ref 101–111)
Creatinine, Ser: 0.67 mg/dL (ref 0.44–1.00)
GFR calc non Af Amer: >60 mL/min (ref >60)
Glucose, Bld: 99 mg/dL (ref 65–99)
Potassium: 3.9 mmol/L (ref 3.5–5.1)
SODIUM: 136 mmol/L (ref 135–145)
TOTAL PROTEIN: 7.8 g/dL (ref 6.5–8.1)

## 2016-07-28 LAB — CBC
HCT: 39.5 % (ref 36.0–46.0)
HEMOGLOBIN: 13.4 g/dL (ref 12.0–15.0)
MCH: 27.3 pg (ref 26.0–34.0)
MCHC: 33.9 g/dL (ref 30.0–36.0)
MCV: 80.6 fL (ref 78.0–100.0)
Platelets: 273 10*3/uL (ref 150–400)
RBC: 4.9 MIL/uL (ref 3.87–5.11)
RDW: 14.2 % (ref 11.5–15.5)
WBC: 8.7 10*3/uL (ref 4.0–10.5)

## 2016-07-28 LAB — I-STAT BETA HCG BLOOD, ED (MC, WL, AP ONLY): I-stat hCG, quantitative: <5 m[IU]/mL (ref <5)

## 2016-07-28 LAB — LIPASE, BLOOD: Lipase: 33 U/L (ref 11–51)

## 2016-07-28 MED ORDER — OMEPRAZOLE 20 MG PO CPDR: 20.0000 mg | DELAYED_RELEASE_CAPSULE | Freq: Every day | ORAL | 0 refills | Status: DC

## 2016-07-28 MED ORDER — MORPHINE SULFATE (PF) 4 MG/ML IV SOLN
4.0000 mg | Freq: Once | INTRAVENOUS | Status: AC
  Administered 2016-07-28: 4 mg via INTRAVENOUS
  Filled 2016-07-28: qty 1

## 2016-07-28 MED ORDER — ONDANSETRON HCL 4 MG/2ML IJ SOLN
4.0000 mg | Freq: Once | INTRAMUSCULAR | Status: AC
Start: 2016-07-28 — End: 2016-07-28
  Administered 2016-07-28: 4 mg via INTRAVENOUS
  Filled 2016-07-28: qty 2

## 2016-07-28 MED ORDER — GI COCKTAIL ~~LOC~~
30.0000 mL | Freq: Once | ORAL | Status: AC
  Administered 2016-07-28: 30 mL via ORAL
  Filled 2016-07-28: qty 30

## 2016-07-28 NOTE — ED Provider Notes (Signed)
[facility]-EMERGENCY DEPT Provider Note   CSN: [id number] Arrival date & time: 07/28/16  0105     History   Chief Complaint Chief Complaint  Patient presents with  . Abdominal Pain    HPI Lisa Le is a 35 y.o. female.  HPI   35 year old female presents today with acute onset abdominal pain. Patient reports she was feeling slightly nauseous earlier today, states that she took a oral Zofran, and had almost immediate epigastric abdominal pain. She describes as a sharp epigastric with no radiation of symptoms. She denies chest pain or shortness of breath. She reports last by mouth intake was at 7 PM, no other close sick contacts.   Past Medical History:  Diagnosis Date  . Complication of anesthesia    unable to urinate after ORIF ankle, had to insert Foley; had constipation x 1 week  . Dental crown present    tooth #9  . Post-traumatic arthritis of ankle, left 07/2016  . Retained orthopedic hardware 07/2016   left ankle  . Tension headache     Patient Active Problem List   Diagnosis Date Noted  . Trimalleolar fracture of left ankle 12/27/2014  . Trimalleolar fracture of ankle, closed 12/27/2014    Past Surgical History:  Procedure Laterality Date  . ANKLE ARTHROSCOPY Left 07/25/2016   Procedure: LEFT ANKLE ARTHROSCOPY WITH EXTENSIVE DEBRIDEMENT;  Surgeon: Toni ArthursJohn Hewitt, MD;  Location: [facility];  Service: Orthopedics;  Laterality: Left;  . HARDWARE REMOVAL Left 07/25/2016   Procedure: LEFT ANKLE REMOVAL OF DEEP IMPLANTS;  Surgeon: Toni ArthursJohn Hewitt, MD;  Location: [facility];  Service: Orthopedics;  Laterality: Left;  . ORIF ANKLE FRACTURE Left 12/27/2014   Procedure: OPEN REDUCTION INTERNAL FIXATION (ORIF) LEFT ANKLE FRACTURE;  Surgeon: Kathryne Hitchhristopher Y Blackman, MD;  Location: [facility];  Service: Orthopedics;  Laterality: Left;    OB History    No data available       Home Medications    Prior to Admission medications   Medication Sig Start  Date End Date Taking? Authorizing Provider  docusate sodium (COLACE) 100 MG capsule Take 1 capsule (100 mg total) by mouth 2 (two) times daily. While taking narcotic pain medicine. Patient taking differently: Take 100 mg by mouth 2 (two) times daily as needed for mild constipation. While taking narcotic pain medicine. 07/25/16  Yes Florentina AddisonJustin Pike Ollis, PA-C  ibuprofen (ADVIL,MOTRIN) 200 MG tablet Take 800 mg by mouth every 6 (six) hours as needed.    Yes Historical Provider, MD  ondansetron (ZOFRAN) 4 MG tablet Take 4 mg by mouth every 8 (eight) hours as needed for nausea or vomiting.   Yes Historical Provider, MD  oxyCODONE (ROXICODONE) 5 MG immediate release tablet Take 1-2 tablets (5-10 mg total) by mouth every 4 (four) hours as needed for moderate pain or severe pain. 07/25/16  Yes Florentina AddisonJustin Pike Ollis, PA-C  omeprazole (PRILOSEC) 20 MG capsule Take 1 capsule (20 mg total) by mouth daily. 07/28/16   Eyvonne MechanicJeffrey Yehudit Fulginiti, PA-C  senna (SENOKOT) 8.6 MG TABS tablet Take 2 tablets (17.2 mg total) by mouth 2 (two) times daily. Patient not taking: Reported on 07/28/2016 07/25/16   Jacinta ShoeJustin Pike Ollis, PA-C    Family History Family History  Problem Relation Age of Onset  . Heart disease Mother   . Hypertension Mother     Social History Social History  Substance Use Topics  . Smoking status: Never Smoker  . Smokeless tobacco: Never Used  . Alcohol use No  Allergies   Latex   Review of Systems Review of Systems  All other systems reviewed and are negative.    Physical Exam Updated Vital Signs BP 130/76 (BP Location: Left Arm)   Pulse 69   Temp 97.7 F (36.5 C)   Resp 16   LMP 07/06/2016 (Exact Date)   SpO2 98%   Physical Exam  Constitutional: She is oriented to person, place, and time. She appears well-developed and well-nourished.  HENT:  Head: Normocephalic and atraumatic.  Eyes: Conjunctivae are normal. Pupils are equal, round, and reactive to light. Right eye exhibits no  discharge. Left eye exhibits no discharge. No scleral icterus.  Neck: Normal range of motion. No JVD present. No tracheal deviation present.  Pulmonary/Chest: Effort normal. No stridor.  Abdominal: Soft. She exhibits no distension and no mass. There is tenderness. There is no rebound and no guarding.  Neurological: She is alert and oriented to person, place, and time. Coordination normal.  Skin: Skin is warm.  Psychiatric: She has a normal mood and affect. Her behavior is normal. Judgment and thought content normal.  Nursing note and vitals reviewed.    ED Treatments / Results  Labs (all labs ordered are listed, but only abnormal results are displayed) Labs Reviewed  COMPREHENSIVE METABOLIC PANEL - Abnormal; Notable for the following:       Result Value   Calcium 8.6 (*)    All other components within normal limits  LIPASE, BLOOD  CBC  I-STAT BETA HCG BLOOD, ED ([facility], [facility], [facility] ONLY)    EKG  EKG Interpretation None       Radiology Dg Abdomen Acute W/chest  Result Date: 07/28/2016 CLINICAL DATA:  Upper abdominal pain and nausea vomiting for 6 hours today. EXAM: DG ABDOMEN ACUTE W/ 1V CHEST COMPARISON:  06/11/2009 FINDINGS: There is no evidence of dilated bowel loops or free intraperitoneal air. No radiopaque calculi or other significant radiographic abnormality is seen. Heart size and mediastinal contours are within normal limits. Both lungs are clear. IMPRESSION: Negative abdominal radiographs.  No acute cardiopulmonary disease. Electronically Signed   By: Ellery Plunkaniel R Mitchell M.D.   On: 07/28/2016 03:27    Procedures Procedures (including critical care time)  Medications Ordered in ED Medications  ondansetron (ZOFRAN) injection 4 mg (4 mg Intravenous Given 07/28/16 0240)  morphine 4 MG/ML injection 4 mg (4 mg Intravenous Given 07/28/16 0240)  gi cocktail (Maalox,Lidocaine,Donnatal) (30 mLs Oral Given 07/28/16 0336)     Initial Impression / Assessment and Plan / ED Course  I  have reviewed the triage vital signs and the nursing notes.  Pertinent labs & imaging results that were available during my care of the patient were reviewed by me and considered in my medical decision making (see chart for details).  Clinical Course      Final Clinical Impressions(s) / ED Diagnoses   Final diagnoses:  Epigastric pain    Labs:  Imaging:  Consults:  Therapeutics:  Discharge Meds:   Assessment/Plan:  35 year old female presents today with acute onset nausea vomiting abdominal pain, and diarrhea. Patient was given pain medication and antinausea medication which significantly improved her symptoms. She was watched here in the ED, with very minimal epigastric pain. After GI cocktail she had complete resolution of symptoms and requesting discharge home. She has negative plain films, soft abdomen no focal tenderness, reassuring vital signs. This is likely a viral gastroenteritis. Patient encouraged to use Zofran, Landau, return immediately if any new or worsening signs or symptoms present and  she verbalized understanding and agreement to today's plan had no further questions or concerns   New Prescriptions Discharge Medication List as of 07/28/2016  5:03 AM    START taking these medications   Details  omeprazole (PRILOSEC) 20 MG capsule Take 1 capsule (20 mg total) by mouth daily., Starting Sun 07/28/2016, Print         Eyvonne Mechanic, PA-C 07/28/16 1610    Tilden Fossa, MD 07/31/16 [phone]

## 2016-07-28 NOTE — ED Triage Notes (Signed)
Pt had surgery on her foot on Thursday.  Pt developed N/V and abdominal pain.  Pt is writhing in pain in triage.  States BM are normal.  Rates pain 7/10.

## 2016-07-28 NOTE — Discharge Instructions (Signed)
Please read attached information. If you experience any new or worsening signs or symptoms please return to the emergency room for evaluation. Please follow-up with your primary care provider or specialist as discussed. Please use medication prescribed only as directed and discontinue taking if you have any concerning signs or symptoms.   °

## 2016-08-13 ENCOUNTER — Ambulatory Visit: Payer: PRIVATE HEALTH INSURANCE | Attending: Orthopedic Surgery | Admitting: Physical Therapy

## 2016-08-13 ENCOUNTER — Encounter: Payer: Self-pay | Admitting: Physical Therapy

## 2016-08-13 DIAGNOSIS — M25673 Stiffness of unspecified ankle, not elsewhere classified: Secondary | ICD-10-CM | POA: Insufficient documentation

## 2016-08-13 DIAGNOSIS — R2232 Localized swelling, mass and lump, left upper limb: Secondary | ICD-10-CM | POA: Insufficient documentation

## 2016-08-13 DIAGNOSIS — M25572 Pain in left ankle and joints of left foot: Secondary | ICD-10-CM | POA: Insufficient documentation

## 2016-08-13 DIAGNOSIS — M25672 Stiffness of left ankle, not elsewhere classified: Secondary | ICD-10-CM | POA: Diagnosis present

## 2016-08-13 DIAGNOSIS — R262 Difficulty in walking, not elsewhere classified: Secondary | ICD-10-CM | POA: Diagnosis present

## 2016-08-13 NOTE — Therapy (Signed)
[facility] [address] [place], [place], [address] Phone: [phone]   Fax:  [phone]  Physical Therapy Evaluation  Patient Details  Name: Lisa GrahamKarima Le MRN: [id number] Date of Birth: 07/26/1981 Referring Provider: Victorino DikeHewitt  Encounter Date: 08/13/2016      PT End of Session - 08/13/16 1427    Visit Number 1   Number of Visits 12   Date for PT Re-Evaluation 10/11/16   PT Start Time 1404   PT Stop Time 1453   PT Time Calculation (min) 49 min   Activity Tolerance Patient tolerated treatment well   Behavior During Therapy [facility] for tasks assessed/performed      Past Medical History:  Diagnosis Date  . Complication of anesthesia    unable to urinate after ORIF ankle, had to insert Foley; had constipation x 1 week  . Dental crown present    tooth #9  . Post-traumatic arthritis of ankle, left 07/2016  . Retained orthopedic hardware 07/2016   left ankle  . Tension headache     Past Surgical History:  Procedure Laterality Date  . ANKLE ARTHROSCOPY Left 07/25/2016   Procedure: LEFT ANKLE ARTHROSCOPY WITH EXTENSIVE DEBRIDEMENT;  Surgeon: Toni ArthursJohn Hewitt, MD;  Location: [facility];  Service: Orthopedics;  Laterality: Left;  . HARDWARE REMOVAL Left 07/25/2016   Procedure: LEFT ANKLE REMOVAL OF DEEP IMPLANTS;  Surgeon: Toni ArthursJohn Hewitt, MD;  Location: [facility];  Service: Orthopedics;  Laterality: Left;  . ORIF ANKLE FRACTURE Left 12/27/2014   Procedure: OPEN REDUCTION INTERNAL FIXATION (ORIF) LEFT ANKLE FRACTURE;  Surgeon: Kathryne Hitchhristopher Y Blackman, MD;  [facility];  Service: Orthopedics;  Laterality: Left;    There were no vitals filed for this visit.       Subjective Assessment - 08/13/16 1406    Subjective Patient was seen here after a tri malleolar fracture with ORIF May 2016.  She underwent rehab and did well just a little bit of a poor gait, she had the hardware removed on 07/25/16.   She was in a cast with a boot with WBAT.  The boot and cast was removed on 08/09/16   Patient Stated Goals walk without any issues   Currently in Pain? Yes   Pain Score 3    Pain Location Ankle   Pain Orientation Left   Pain Descriptors / Indicators Aching;Sore   Pain Type Surgical pain   Pain Onset 1 to 4 weeks ago   Pain Frequency Constant   Aggravating Factors  Being up on it will cause pain up to 6/10 mostly in the anterior and lateral ankle   Pain Relieving Factors rest            [facility] - 08/13/16 0001      Assessment   Medical Diagnosis s/p hardware removal ORIF of the left ankle   Referring Provider Hewitt   Onset Date/Surgical Date 07/25/16     Balance Screen   Has the patient fallen in the past 6 months No   Has the patient had a decrease in activity level because of a fear of falling?  No   Is the patient reluctant to leave their home because of a fear of falling?  No     Home Environment   Additional Comments stairs to her apartment     Prior Function   Level of Independence Independent   Vocation Full time employment   Vocation Requirements sitting at the computer,  answering pohne   Leisure house stuff, children     Observation/Other Assessments-Edema    Edema Circumferential     Circumferential Edema   Circumferential - Right mid malleolous 28cm   Circumferential - Left  mid malleolous 30.5cm     AROM   Left Ankle Dorsiflexion 0   Left Ankle Plantar Flexion 45   Left Ankle Inversion 10   Left Ankle Eversion 5     Palpation   Palpation comment scar is healing, some tenderness over the scars, the ankle is swollen and tender anteriorly and laterally     Ambulation/Gait   Gait Comments gait is without device, mild antalgic gait on the left                   [facility] Adult PT Treatment/Exercise - 08/13/16 0001      Vasopneumatic   Number Minutes Vasopneumatic  15 minutes   Vasopnuematic Location  Ankle   Vasopneumatic  Pressure Medium   Vasopneumatic Temperature  35                PT Education - 08/13/16 1425    Education provided Yes   Education Details HEP for ankle mobility and ROM   Person(s) Educated Patient   Methods Explanation;Demonstration;Handout   Comprehension Verbalized understanding          PT Short Term Goals - 08/13/16 1431      PT SHORT TERM GOAL #1   Title Pt will be ind with HEP   Time 2   Period Weeks   Status New           PT Long Term Goals - 08/13/16 1431      PT LONG TERM GOAL #1   Title ambulate all distances and most surfaces without difficulty   Time 6   Period Weeks   Status New     PT LONG TERM GOAL #2   Title Pt will increase AROM of ankle DF to 10 degrees to improve gait mechanics   Time 6   Period Weeks   Status New     PT LONG TERM GOAL #3   Title ascend and descend stairs step over step   Time 6   Period Weeks   Status New     PT LONG TERM GOAL #4   Title able to stand on the left leg only for 15 seconds   Time 6   Period Weeks   Status New               Plan - 08/13/16 1428    Clinical Impression Statement Patient had a tri malleolar fracture in May 2016, she was seen here by PT with very good results only haveing a slight limp when she was d/c'd.  She underwent hardware removal on 07/25/16.  She currently is missing ROM for DF, inversion and eversion.  Has slight limp and some significant swelling of the ankle.   Rehab Potential Good   PT Frequency 2x / week   PT Duration 6 weeks   PT Treatment/Interventions ADLs/Self Care Home Management;Cryotherapy;Electrical Stimulation;Iontophoresis 4mg /ml Dexamethasone;Moist Heat;Ultrasound;DME Instruction;Gait training;Stair training;Functional mobility training;Therapeutic activities;Therapeutic exercise;Balance training;Neuromuscular re-education;Patient/family education;Manual techniques;Scar mobilization;Passive range of motion;Vasopneumatic Device   PT Next Visit Plan Will  slowly add ROM, strength and work on the swelling   Consulted and Agree with Plan of Care Patient      Patient will benefit from skilled therapeutic intervention in order to improve the following deficits and impairments:  Abnormal gait, Decreased activity tolerance, Decreased balance, Decreased coordination, Decreased endurance, Decreased mobility, Decreased range of motion, Decreased scar mobility, Decreased strength, Difficulty walking, Hypomobility, Increased edema, Pain  Visit Diagnosis: Pain in left ankle and joints of left foot - Plan: PT plan of care cert/re-cert  Difficulty in walking, not elsewhere classified - Plan: PT plan of care cert/re-cert  Stiffness of left ankle, not elsewhere classified - Plan: PT plan of care cert/re-cert  Localized swelling, mass and lump, left upper limb - Plan: PT plan of care cert/re-cert     Problem List Patient Active Problem List   Diagnosis Date Noted  . Trimalleolar fracture of left ankle 12/27/2014  . Trimalleolar fracture of ankle, closed 12/27/2014    Jearld Lesch., PT 08/13/2016, 2:34 PM  [facility] [place] [facility] [address] [place], [place], [address] Phone: [phone]   Fax:  [phone]  Name: Lisa Le MRN: [id number] Date of Birth: 1981/04/02

## 2016-08-20 ENCOUNTER — Encounter: Payer: Self-pay | Admitting: Physical Therapy

## 2016-08-20 ENCOUNTER — Ambulatory Visit: Payer: PRIVATE HEALTH INSURANCE | Admitting: Physical Therapy

## 2016-08-20 DIAGNOSIS — R2232 Localized swelling, mass and lump, left upper limb: Secondary | ICD-10-CM

## 2016-08-20 DIAGNOSIS — M25672 Stiffness of left ankle, not elsewhere classified: Secondary | ICD-10-CM

## 2016-08-20 DIAGNOSIS — M25572 Pain in left ankle and joints of left foot: Secondary | ICD-10-CM | POA: Diagnosis not present

## 2016-08-20 NOTE — Therapy (Signed)
[facility] [address] [place], [place], [address] Phone: [phone]   Fax:  [phone]  Physical Therapy Treatment  Patient Details  Name: Lisa GrahamKarima Le MRN: [id number] Date of Birth: 08/27/1980 Referring Provider: Victorino DikeHewitt  Encounter Date: 08/20/2016      PT End of Session - 08/20/16 1051    Visit Number 2   Number of Visits 12   Date for PT Re-Evaluation 10/11/16   PT Start Time 1015   PT Stop Time 1107   PT Time Calculation (min) 52 min      Past Medical History:  Diagnosis Date  . Complication of anesthesia    unable to urinate after ORIF ankle, had to insert Foley; had constipation x 1 week  . Dental crown present    tooth #9  . Post-traumatic arthritis of ankle, left 07/2016  . Retained orthopedic hardware 07/2016   left ankle  . Tension headache     Past Surgical History:  Procedure Laterality Date  . ANKLE ARTHROSCOPY Left 07/25/2016   Procedure: LEFT ANKLE ARTHROSCOPY WITH EXTENSIVE DEBRIDEMENT;  Surgeon: Toni ArthursJohn Hewitt, MD;  Location: [facility];  Service: Orthopedics;  Laterality: Left;  . HARDWARE REMOVAL Left 07/25/2016   Procedure: LEFT ANKLE REMOVAL OF DEEP IMPLANTS;  Surgeon: Toni ArthursJohn Hewitt, MD;  Location: [facility];  Service: Orthopedics;  Laterality: Left;  . ORIF ANKLE FRACTURE Left 12/27/2014   Procedure: OPEN REDUCTION INTERNAL FIXATION (ORIF) LEFT ANKLE FRACTURE;  Surgeon: Kathryne Hitchhristopher Y Blackman, MD;  [facility];  Service: Orthopedics;  Laterality: Left;    There were no vitals filed for this visit.      Subjective Assessment - 08/20/16 1018    Subjective Swelling is bothering patient mainly. She has most of her trouble while using the steps but walking on normal terrain is fine.   Currently in Pain? Yes   Pain Score 4    Pain Location Ankle   Pain Orientation Left   Pain Descriptors / Indicators Aching                          [facility] Adult PT Treatment/Exercise - 08/20/16 0001      Lumbar Exercises: Aerobic   Elliptical 4 min     Vasopneumatic   Number Minutes Vasopneumatic  15 minutes   Vasopnuematic Location  Ankle   Vasopneumatic Pressure Medium   Vasopneumatic Temperature  35     Ankle Exercises: Standing   BAPS Standing;Level 2;15 reps   BAPS Limitations DF/PF/In/Ev and circle (CW/CCW)   Heel Raises 20 reps   Toe Raise 20 reps   Other Standing Ankle Exercises Step ups/ Step downs x 20 leading with each 4 in step     Ankle Exercises: Seated   Other Seated Ankle Exercises red tband ankle 4 way 20 times                  PT Short Term Goals - 08/13/16 1431      PT SHORT TERM GOAL #1   Title Pt will be ind with HEP   Time 2   Period Weeks   Status New           PT Long Term Goals - 08/13/16 1431      PT LONG TERM GOAL #1   Title ambulate all distances and most surfaces without difficulty   Time 6   Period Weeks   Status  New     PT LONG TERM GOAL #2   Title Pt will increase AROM of ankle DF to 10 degrees to improve gait mechanics   Time 6   Period Weeks   Status New     PT LONG TERM GOAL #3   Title ascend and descend stairs step over step   Time 6   Period Weeks   Status New     PT LONG TERM GOAL #4   Title able to stand on the left leg only for 15 seconds   Time 6   Period Weeks   Status New               Plan - 08/20/16 1051    Clinical Impression Statement Patient tolerated treatment well but eversion and inversion exercises caused increased pain. The BAP fatigued her ankle especially during CW and CCW circles. Pain increased to a 5/10 and ankle was fattigued. Needed VC for correct technique on exercises.    Rehab Potential Good   PT Next Visit Plan Continue with high level standing(BAPS) and increase height of step.      Patient will benefit from skilled therapeutic intervention in order to improve the following deficits and impairments:  Abnormal  gait, Decreased activity tolerance, Decreased balance, Decreased coordination, Decreased endurance, Decreased mobility, Decreased range of motion, Decreased scar mobility, Decreased strength, Difficulty walking, Hypomobility, Increased edema, Pain  Visit Diagnosis: Pain in left ankle and joints of left foot  Localized swelling, mass and lump, left upper limb  Stiffness of left ankle, not elsewhere classified     Problem List Patient Active Problem List   Diagnosis Date Noted  . Trimalleolar fracture of left ankle 12/27/2014  . Trimalleolar fracture of ankle, closed 12/27/2014    Phillips Odor, [place] 08/20/2016, 10:57 AM  [facility] [place] [facility] [address] [place], [place], [address] Phone: [phone]   Fax:  [phone]  Name: Lisa Le MRN: [id number] Date of Birth: 07-Jul-1981

## 2016-08-22 ENCOUNTER — Ambulatory Visit: Payer: PRIVATE HEALTH INSURANCE | Admitting: Physical Therapy

## 2016-08-23 ENCOUNTER — Encounter: Payer: Self-pay | Admitting: Physical Therapy

## 2016-08-23 ENCOUNTER — Ambulatory Visit: Payer: PRIVATE HEALTH INSURANCE | Admitting: Physical Therapy

## 2016-08-23 DIAGNOSIS — M25672 Stiffness of left ankle, not elsewhere classified: Secondary | ICD-10-CM

## 2016-08-23 DIAGNOSIS — M25572 Pain in left ankle and joints of left foot: Secondary | ICD-10-CM | POA: Diagnosis not present

## 2016-08-23 DIAGNOSIS — R2232 Localized swelling, mass and lump, left upper limb: Secondary | ICD-10-CM

## 2016-08-23 DIAGNOSIS — R262 Difficulty in walking, not elsewhere classified: Secondary | ICD-10-CM

## 2016-08-23 NOTE — Therapy (Signed)
[facility] [address] [place], [place], [address] Phone: [phone]   Fax:  [phone]  Physical Therapy Treatment  Patient Details  Name: Lisa GrahamKarima Mchatton MRN: [id number] Date of Birth: 05/06/1981 Referring Provider: Victorino DikeHewitt  Encounter Date: 08/23/2016      PT End of Session - 08/23/16 1353    Visit Number 3   Number of Visits 12   Date for PT Re-Evaluation 10/11/16   PT Start Time 1315   PT Stop Time 1400   PT Time Calculation (min) 45 min   Activity Tolerance Patient tolerated treatment well   Behavior During Therapy [facility] for tasks assessed/performed      Past Medical History:  Diagnosis Date  . Complication of anesthesia    unable to urinate after ORIF ankle, had to insert Foley; had constipation x 1 week  . Dental crown present    tooth #9  . Post-traumatic arthritis of ankle, left 07/2016  . Retained orthopedic hardware 07/2016   left ankle  . Tension headache     Past Surgical History:  Procedure Laterality Date  . ANKLE ARTHROSCOPY Left 07/25/2016   Procedure: LEFT ANKLE ARTHROSCOPY WITH EXTENSIVE DEBRIDEMENT;  Surgeon: Toni ArthursJohn Hewitt, MD;  Location: [facility];  Service: Orthopedics;  Laterality: Left;  . HARDWARE REMOVAL Left 07/25/2016   Procedure: LEFT ANKLE REMOVAL OF DEEP IMPLANTS;  Surgeon: Toni ArthursJohn Hewitt, MD;  Location: [facility];  Service: Orthopedics;  Laterality: Left;  . ORIF ANKLE FRACTURE Left 12/27/2014   Procedure: OPEN REDUCTION INTERNAL FIXATION (ORIF) LEFT ANKLE FRACTURE;  Surgeon: Kathryne Hitchhristopher Y Blackman, MD;  [facility];  Service: Orthopedics;  Laterality: Left;    There were no vitals filed for this visit.      Subjective Assessment - 08/23/16 1319    Subjective "Good good , not bad, Its more pain at night, swelling is still in there"   Currently in Pain? Yes   Pain Score 2    Pain Location Ankle   Pain Orientation Left                          [facility] Adult PT Treatment/Exercise - 08/23/16 0001      Ambulation/Gait   Stairs Yes   Stairs Assistance 6: Modified independent (Device/Increase time)   Stair Management Technique One rail Right;Alternating pattern   Number of Stairs 24   Height of Stairs 6   Gait Comments 3 flights      Cryotherapy   Number Minutes Cryotherapy 10 Minutes   Cryotherapy Location Ankle   Type of Cryotherapy Ice pack     Ankle Exercises: Aerobic   Elliptical I 10 R 5 2 frd/ 2 rev     Ankle Exercises: Standing   Heel Raises 20 reps  x2   Toe Raise 20 reps  x2     Ankle Exercises: Seated   Other Seated Ankle Exercises red tband ankle 4 way 20 times   Other Seated Ankle Exercises Sit to stand holding yellow ball regular chair 2x10                   PT Short Term Goals - 08/13/16 1431      PT SHORT TERM GOAL #1   Title Pt will be ind with HEP   Time 2   Period Weeks   Status New           PT Long  Term Goals - 08/13/16 1431      PT LONG TERM GOAL #1   Title ambulate all distances and most surfaces without difficulty   Time 6   Period Weeks   Status New     PT LONG TERM GOAL #2   Title Pt will increase AROM of ankle DF to 10 degrees to improve gait mechanics   Time 6   Period Weeks   Status New     PT LONG TERM GOAL #3   Title ascend and descend stairs step over step   Time 6   Period Weeks   Status New     PT LONG TERM GOAL #4   Title able to stand on the left leg only for 15 seconds   Time 6   Period Weeks   Status New               Plan - 08/23/16 1353    Clinical Impression Statement Pt able to progress to stair negotiation mod I. She reports that she is afraid of fall so that that's why she uses the rail. Does report some pain with inversion and eversion with tband resistance.   Rehab Potential Good   PT Frequency 2x / week   PT Duration 6 weeks   PT Treatment/Interventions ADLs/Self Care Home  Management;Cryotherapy;Electrical Stimulation;Iontophoresis 4mg /ml Dexamethasone;Moist Heat;Ultrasound;DME Instruction;Gait training;Stair training;Functional mobility training;Therapeutic activities;Therapeutic exercise;Balance training;Neuromuscular re-education;Patient/family education;Manual techniques;Scar mobilization;Passive range of motion;Vasopneumatic Device   PT Next Visit Plan Continue with high level standing(BAPS) and increase height of step.      Patient will benefit from skilled therapeutic intervention in order to improve the following deficits and impairments:  Abnormal gait, Decreased activity tolerance, Decreased balance, Decreased coordination, Decreased endurance, Decreased mobility, Decreased range of motion, Decreased scar mobility, Decreased strength, Difficulty walking, Hypomobility, Increased edema, Pain  Visit Diagnosis: Pain in left ankle and joints of left foot  Stiffness of left ankle, not elsewhere classified  Difficulty in walking, not elsewhere classified  Localized swelling, mass and lump, left upper limb  Ankle stiffness, left     Problem List Patient Active Problem List   Diagnosis Date Noted  . Trimalleolar fracture of left ankle 12/27/2014  . Trimalleolar fracture of ankle, closed 12/27/2014    Grayce Sessions 08/23/2016, 1:55 PM  [facility] [place] [facility] [address] [place], [place], [address] Phone: [phone]   Fax:  [phone]  Name: Danyle Boening MRN: [id number] Date of Birth: 31-Aug-1980

## 2016-08-27 ENCOUNTER — Ambulatory Visit: Payer: PRIVATE HEALTH INSURANCE | Admitting: Physical Therapy

## 2016-08-27 ENCOUNTER — Encounter: Payer: Self-pay | Admitting: Physical Therapy

## 2016-08-27 DIAGNOSIS — M25572 Pain in left ankle and joints of left foot: Secondary | ICD-10-CM | POA: Diagnosis not present

## 2016-08-27 DIAGNOSIS — R2232 Localized swelling, mass and lump, left upper limb: Secondary | ICD-10-CM

## 2016-08-27 DIAGNOSIS — M25672 Stiffness of left ankle, not elsewhere classified: Secondary | ICD-10-CM

## 2016-08-27 NOTE — Therapy (Signed)
[facility] [address] [place], [place], [address] Phone: [phone]   Fax:  [phone]  Physical Therapy Treatment  Patient Details  Name: Lisa GrahamKarima Le MRN: [id number] Date of Birth: 03/14/1981 Referring Provider: Victorino DikeHewitt  Encounter Date: 08/27/2016      PT End of Session - 08/27/16 1048    Visit Number 4   Number of Visits 12   Date for PT Re-Evaluation 10/11/16   PT Start Time 1018   PT Stop Time 1102   PT Time Calculation (min) 44 min      Past Medical History:  Diagnosis Date  . Complication of anesthesia    unable to urinate after ORIF ankle, had to insert Foley; had constipation x 1 week  . Dental crown present    tooth #9  . Post-traumatic arthritis of ankle, left 07/2016  . Retained orthopedic hardware 07/2016   left ankle  . Tension headache     Past Surgical History:  Procedure Laterality Date  . ANKLE ARTHROSCOPY Left 07/25/2016   Procedure: LEFT ANKLE ARTHROSCOPY WITH EXTENSIVE DEBRIDEMENT;  Surgeon: Toni ArthursJohn Hewitt, MD;  [facility];  Service: Orthopedics;  Laterality: Left;  . HARDWARE REMOVAL Left 07/25/2016   Procedure: LEFT ANKLE REMOVAL OF DEEP IMPLANTS;  Surgeon: Toni ArthursJohn Hewitt, MD;  Location:  [facility];  Service: Orthopedics;  Laterality: Left;  . ORIF ANKLE FRACTURE Left 12/27/2014   Procedure: OPEN REDUCTION INTERNAL FIXATION (ORIF) LEFT ANKLE FRACTURE;  Surgeon: Kathryne Hitchhristopher Y Blackman, MD;  [facility];  Service: Orthopedics;  Laterality: Left;    There were no vitals filed for this visit.      Subjective Assessment - 08/27/16 1022    Subjective Patient stated ankle feels the same achy and swollen.    Currently in Pain? Yes   Pain Score 4    Pain Location Ankle   Pain Orientation Left   Pain Descriptors / Indicators Aching                         [facility] Adult PT Treatment/Exercise - 08/27/16 0001      High Level  Balance   High Level Balance Comments steps overs x20 each foot leading on airex, SLS 3x30 secs leading with both     Vasopneumatic   Number Minutes Vasopneumatic  15 minutes   Vasopnuematic Location  Ankle   Vasopneumatic Pressure High   Vasopneumatic Temperature  35     Ankle Exercises: Aerobic   Elliptical 3 forward/ 3 backwards     Ankle Exercises: Seated   Heel Raises 20 reps  black beam   Toe Raise 20 reps  black beam   Other Seated Ankle Exercises inv/ever red tband x 20 each     Ankle Exercises: Standing   Other Standing Ankle Exercises Step ups/ Step downs x 20 leading with each 6 in step, Lat. step                  PT Short Term Goals - 08/13/16 1431      PT SHORT TERM GOAL #1   Title Pt will be ind with HEP   Time 2   Period Weeks   Status New           PT Long Term Goals - 08/13/16 1431      PT LONG TERM GOAL #1   Title ambulate all distances and most surfaces without difficulty  Time 6   Period Weeks   Status New     PT LONG TERM GOAL #2   Title Pt will increase AROM of ankle DF to 10 degrees to improve gait mechanics   Time 6   Period Weeks   Status New     PT LONG TERM GOAL #3   Title ascend and descend stairs step over step   Time 6   Period Weeks   Status New     PT LONG TERM GOAL #4   Title able to stand on the left leg only for 15 seconds   Time 6   Period Weeks   Status New               Plan - 08/27/16 1049    Clinical Impression Statement Patient tolerated treatment well but eversion and inversion exercises are still painful. By the end of treatment patient was having medial left ankle pain with lateral step ups and step downs patient was scared of falling so she used one hand support and had to gather herself after each one. Pain stated her pain increased from the beginning to treatment to the end. Patient could complete 8in step step ups but could only do 6in step downs due to compensation maneuers with the 8  in step.   Rehab Potential Good   PT Frequency 2x / week   PT Duration 6 weeks   PT Treatment/Interventions ADLs/Self Care Home Management;Cryotherapy;Electrical Stimulation;Iontophoresis 4mg /ml Dexamethasone;Moist Heat;Ultrasound;DME Instruction;Gait training;Stair training;Functional mobility training;Therapeutic activities;Therapeutic exercise;Balance training;Neuromuscular re-education;Patient/family education;Manual techniques;Scar mobilization;Passive range of motion;Vasopneumatic Device   PT Next Visit Plan Strengthen inversion/eversion and high level balance and proprioception.   Consulted and Agree with Plan of Care Patient      Patient will benefit from skilled therapeutic intervention in order to improve the following deficits and impairments:  Abnormal gait, Decreased activity tolerance, Decreased balance, Decreased coordination, Decreased endurance, Decreased mobility, Decreased range of motion, Decreased scar mobility, Decreased strength, Difficulty walking, Hypomobility, Increased edema, Pain  Visit Diagnosis: Pain in left ankle and joints of left foot  Stiffness of left ankle, not elsewhere classified  Localized swelling, mass and lump, left upper limb     Problem List Patient Active Problem List   Diagnosis Date Noted  . Trimalleolar fracture of left ankle 12/27/2014  . Trimalleolar fracture of ankle, closed 12/27/2014    Phillips Odor, [place] 08/27/2016, 10:55 AM  [facility] [place] [facility] [address] [place], [place], [address] Phone: [phone]   Fax:  [phone]  Name: Lisa Le MRN: [id number] Date of Birth: 1980/10/30

## 2016-08-29 ENCOUNTER — Ambulatory Visit: Payer: PRIVATE HEALTH INSURANCE | Admitting: Physical Therapy

## 2016-08-29 ENCOUNTER — Encounter: Payer: Self-pay | Admitting: Physical Therapy

## 2016-08-29 DIAGNOSIS — M25673 Stiffness of unspecified ankle, not elsewhere classified: Secondary | ICD-10-CM

## 2016-08-29 DIAGNOSIS — M25572 Pain in left ankle and joints of left foot: Secondary | ICD-10-CM

## 2016-08-29 DIAGNOSIS — M25672 Stiffness of left ankle, not elsewhere classified: Secondary | ICD-10-CM

## 2016-08-29 DIAGNOSIS — R2232 Localized swelling, mass and lump, left upper limb: Secondary | ICD-10-CM

## 2016-08-29 NOTE — Therapy (Signed)
[place], [place], [address] Phone: [phone]   Fax:  [phone]  Physical Therapy Treatment  Patient Details  Name: Lisa Le MRN: [id number] Date of Birth: 12/14/1980 Referring Provider: Doran Durand  Encounter Date: 08/29/2016      PT End of Session - 08/29/16 5284    Visit Number 5   Number of Visits 12   Date for PT Re-Evaluation 10/11/16   PT Start Time 1400   PT Stop Time 1453   PT Time Calculation (min) 53 min   Activity Tolerance Patient tolerated treatment well   Behavior During Therapy [facility] for tasks assessed/performed      Past Medical History:  Diagnosis Date  . Complication of anesthesia    unable to urinate after ORIF ankle, had to insert Foley; had constipation x 1 week  . Dental crown present    tooth #9  . Post-traumatic arthritis of ankle, left 07/2016  . Retained orthopedic hardware 07/2016   left ankle  . Tension headache     Past Surgical History:  Procedure Laterality Date  . ANKLE ARTHROSCOPY Left 07/25/2016   Procedure: LEFT ANKLE ARTHROSCOPY WITH EXTENSIVE DEBRIDEMENT;  Surgeon: Wylene Simmer, MD;  Location: [place];  Service: Orthopedics;  Laterality: Left;  . HARDWARE REMOVAL Left 07/25/2016   Procedure: LEFT ANKLE REMOVAL OF DEEP IMPLANTS;  Surgeon: Wylene Simmer, MD;  Location: [place];  Service: Orthopedics;  Laterality: Left;  . ORIF ANKLE FRACTURE Left 12/27/2014   Procedure: OPEN REDUCTION INTERNAL FIXATION (ORIF) LEFT ANKLE FRACTURE;  Surgeon: Mcarthur Rossetti, MD;  Location: [place];  Service: Orthopedics;  Laterality: Left;    There were no vitals filed for this visit.      Subjective Assessment - 08/29/16 1405    Subjective Really sore from last treatment, but elevating at home has helped the swelling.   Pain Score 3    Pain Location Ankle   Pain Orientation Left                          [facility] Adult PT Treatment/Exercise - 08/29/16 0001      High Level Balance   High Level Balance Comments SLS on airex 3x30sec     Lumbar Exercises: Aerobic   Elliptical 4 fwd/ 4 back      Vasopneumatic   Number Minutes Vasopneumatic  15 minutes   Vasopnuematic Location  Ankle   Vasopneumatic Pressure High   Vasopneumatic Temperature  35     Ankle Exercises: Standing   Heel Raises 20 reps  on 6 in step   Toe Raise 20 reps  on 6in step   Other Standing Ankle Exercises step downs x10 each foot leading     Ankle Exercises: Seated   Other Seated Ankle Exercises inv/ever blue tband x 20 each   Other Seated Ankle Exercises partial lunges x10 each foot                  PT Short Term Goals - 08/29/16 1446      PT SHORT TERM GOAL #1   Title Pt will be ind with HEP   Status Achieved           PT Long Term Goals - 08/29/16 1446      PT LONG TERM GOAL #1   Title ambulate all distances and most surfaces without difficulty   Status On-going  PT LONG TERM GOAL #2   Title Pt will increase AROM of ankle DF to 10 degrees to improve gait mechanics   Status On-going     PT LONG TERM GOAL #3   Title ascend and descend stairs step over step   Status On-going     PT LONG TERM GOAL #4   Title able to stand on the left leg only for 15 seconds   Baseline needs one hand support but can maintain for 30 secs   Status Partially Met             Patient will benefit from skilled therapeutic intervention in order to improve the following deficits and impairments:     Visit Diagnosis: Pain in left ankle and joints of left foot  Stiffness of left ankle, not elsewhere classified  Localized swelling, mass and lump, left upper limb  Ankle stiffness, left  Decreased range of motion of ankle     Problem List Patient Active Problem List   Diagnosis Date Noted  . Trimalleolar fracture of left ankle 12/27/2014  . Trimalleolar fracture of ankle, closed  12/27/2014    Brittany Tolbert, SPTA 08/29/2016, 2:51 PM  [facility] [address] [place], [place], [address] Phone: [phone]   Fax:  [phone]  Name: Mercedies Campise MRN: [id number] Date of Birth: 11/26/1980   

## 2016-09-02 ENCOUNTER — Ambulatory Visit: Payer: PRIVATE HEALTH INSURANCE | Admitting: Physical Therapy

## 2016-09-02 ENCOUNTER — Encounter: Payer: Self-pay | Admitting: Physical Therapy

## 2016-09-02 DIAGNOSIS — M25673 Stiffness of unspecified ankle, not elsewhere classified: Secondary | ICD-10-CM

## 2016-09-02 DIAGNOSIS — R2232 Localized swelling, mass and lump, left upper limb: Secondary | ICD-10-CM

## 2016-09-02 DIAGNOSIS — M25572 Pain in left ankle and joints of left foot: Secondary | ICD-10-CM | POA: Diagnosis not present

## 2016-09-02 DIAGNOSIS — M25672 Stiffness of left ankle, not elsewhere classified: Secondary | ICD-10-CM

## 2016-09-02 NOTE — Therapy (Signed)
Lisa Le, [place], [address] Phone: [phone]   Fax:  [phone]  Physical Therapy Treatment  Patient Details  Name: Lisa Le MRN: [id number] Date of Birth: 1980-11-18 Referring Provider: Doran Durand  Encounter Date: 09/02/2016      PT End of Session - 09/02/16 1551    Visit Number 6   Number of Visits 12   Date for PT Re-Evaluation 10/11/16   PT Start Time 7628   PT Stop Time 1608   PT Time Calculation (min) 53 min   Activity Tolerance Patient tolerated treatment well   Behavior During Therapy [facility] for tasks assessed/performed      Past Medical History:  Diagnosis Date  . Complication of anesthesia    unable to urinate after ORIF ankle, had to insert Foley; had constipation x 1 week  . Dental crown present    tooth #9  . Post-traumatic arthritis of ankle, left 07/2016  . Retained orthopedic hardware 07/2016   left ankle  . Tension headache     Past Surgical History:  Procedure Laterality Date  . ANKLE ARTHROSCOPY Left 07/25/2016   Procedure: LEFT ANKLE ARTHROSCOPY WITH EXTENSIVE DEBRIDEMENT;  Surgeon: Wylene Simmer, MD;  Location: [place];  Service: Orthopedics;  Laterality: Left;  . HARDWARE REMOVAL Left 07/25/2016   Procedure: LEFT ANKLE REMOVAL OF DEEP IMPLANTS;  Surgeon: Wylene Simmer, MD;  Location: [place];  Service: Orthopedics;  Laterality: Left;  . ORIF ANKLE FRACTURE Left 12/27/2014   Procedure: OPEN REDUCTION INTERNAL FIXATION (ORIF) LEFT ANKLE FRACTURE;  Surgeon: Mcarthur Rossetti, MD;  Location: [place];  Service: Orthopedics;  Laterality: Left;    There were no vitals filed for this visit.      Subjective Assessment - 09/02/16 1518    Subjective Sore and swollen but has been icing and elevating it at home.   Pain Score 4    Pain Location Ankle   Pain Orientation Left                         [facility] Adult PT  Treatment/Exercise - 09/02/16 0001      High Level Balance   High Level Balance Comments SLS on airex 3x30sec, tandem stance 3x30 sec on airex      Vasopneumatic   Number Minutes Vasopneumatic  15 minutes   Vasopnuematic Location  Ankle   Vasopneumatic Pressure High   Vasopneumatic Temperature  35     Ankle Exercises: Aerobic   Elliptical 3 forward/ 3 backwards     Ankle Exercises: Seated   Heel Raises 20 reps  on airex   Toe Raise 20 reps  on airex   Other Seated Ankle Exercises inv/ever blue tband x 20 each     Ankle Exercises: Standing   Other Standing Ankle Exercises step downs 6 in x20 LLE, lateral step ups 6 in x20 LLE, Step ups 8in x20 LLE leading and down with the RLE leading    Other Standing Ankle Exercises partial lunges x10 each each leg leading                   PT Short Term Goals - 08/29/16 1446      PT SHORT TERM GOAL #1   Title Pt will be ind with HEP   Status Achieved           PT Long Term Goals - 08/29/16 1446  PT LONG TERM GOAL #1   Title ambulate all distances and most surfaces without difficulty   Status On-going     PT LONG TERM GOAL #2   Title Pt will increase AROM of ankle DF to 10 degrees to improve gait mechanics   Status On-going     PT LONG TERM GOAL #3   Title ascend and descend stairs step over step   Status On-going     PT LONG TERM GOAL #4   Title able to stand on the left leg only for 15 seconds   Baseline needs one hand support but can maintain for 30 secs   Status Partially Met               Plan - 09/02/16 1553    Clinical Impression Statement patient tolerated treatment well and had less pain with blue tband ankle eversion/inversion. Patient had increased pain with partial lunges with each leg leading and patient also had stability issues and pain during exercises on airex. Patient needed min. VC on correct technique and posture. Steps still cause increased pain.   Rehab Potential Good   PT  Frequency 2x / week   PT Duration 6 weeks   PT Treatment/Interventions ADLs/Self Care Home Management;Cryotherapy;Electrical Stimulation;Iontophoresis 41m/ml Dexamethasone;Moist Heat;Ultrasound;DME Instruction;Gait training;Stair training;Functional mobility training;Therapeutic activities;Therapeutic exercise;Balance training;Neuromuscular re-education;Patient/family education;Manual techniques;Scar mobilization;Passive range of motion;Vasopneumatic Device   PT Next Visit Plan Strengthen inversion/eversion and high level balance and proprioception.      Patient will benefit from skilled therapeutic intervention in order to improve the following deficits and impairments:  Abnormal gait, Decreased activity tolerance, Decreased balance, Decreased coordination, Decreased endurance, Decreased mobility, Decreased range of motion, Decreased scar mobility, Decreased strength, Difficulty walking, Hypomobility, Increased edema, Pain  Visit Diagnosis: Pain in left ankle and joints of left foot  Localized swelling, mass and lump, left upper limb  Stiffness of left ankle, not elsewhere classified  Decreased range of motion of ankle     Problem List Patient Active Problem List   Diagnosis Date Noted  . Trimalleolar fracture of left ankle 12/27/2014  . Trimalleolar fracture of ankle, closed 12/27/2014    [name] SAlaska1/29/2018, 3:57 PM  [place] [address]Phone: [phone]  Fax:  [phone] Name: Lisa BiskupMRN: [id number] of Birth: 225-Oct-1982

## 2016-09-03 IMAGING — MR MR ANKLE*L* W/O CM
5 of 7 series · 23 of 40 positions shown · non-contrast
Comparison: Radiographs 12/20/2014 and 12/27/2014.

CLINICAL DATA: Medial ankle and heel pain. History of ankle
fracture dislocation with surgery in December 2014. No recent injury.

EXAM:
MRI OF THE LEFT ANKLE WITHOUT CONTRAST
TECHNIQUE: Multiplanar, multisequence MR imaging of the ankle was performed.
Metal artifact reduction sequences were utilized. No intravenous
contrast was administered.

[Series 2: T1 · sagittal · 4.0mm · 0.33mm/px · 5 of 21 slices shown (1 of 3)]
[im 1/21]
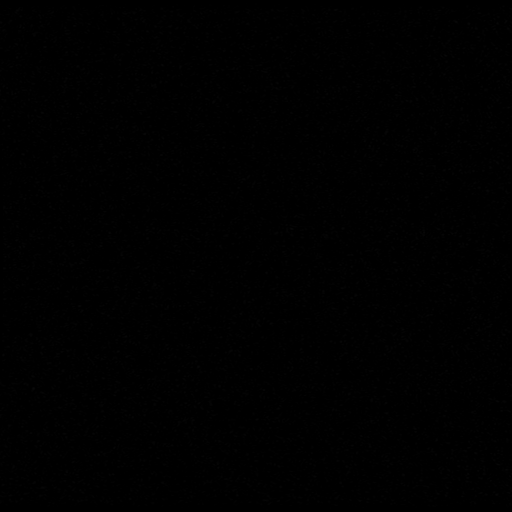
[im 6/21]
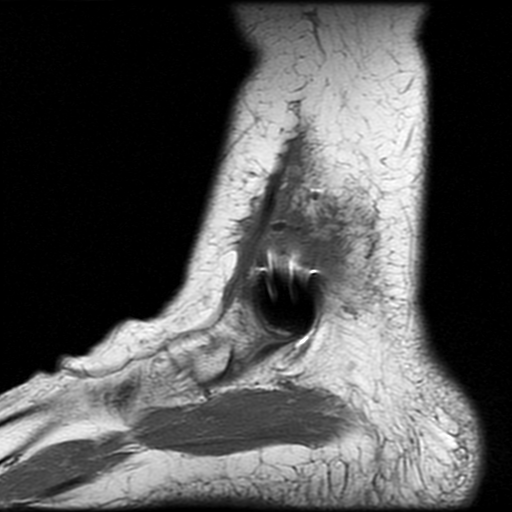
[im 11/21]
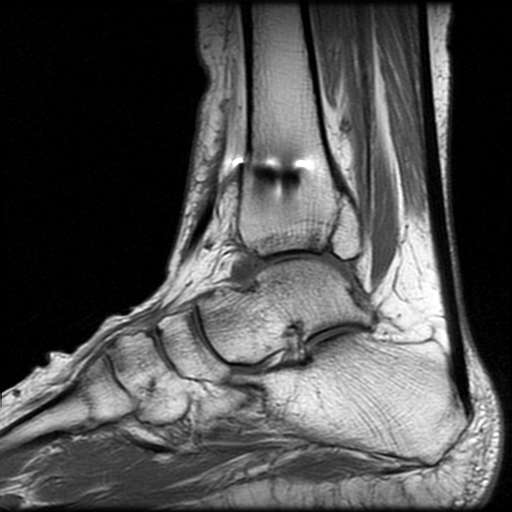
[im 16/21]
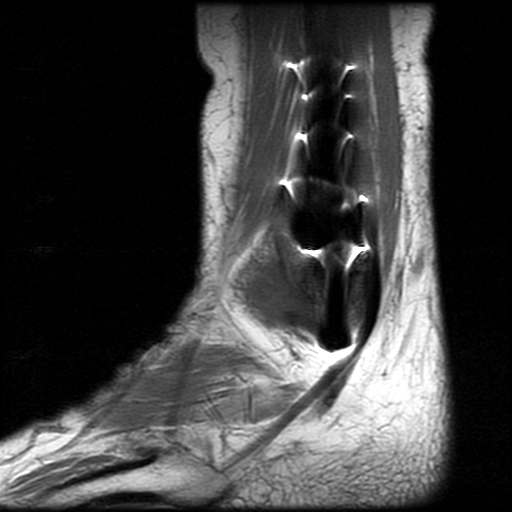
[im 21/21]
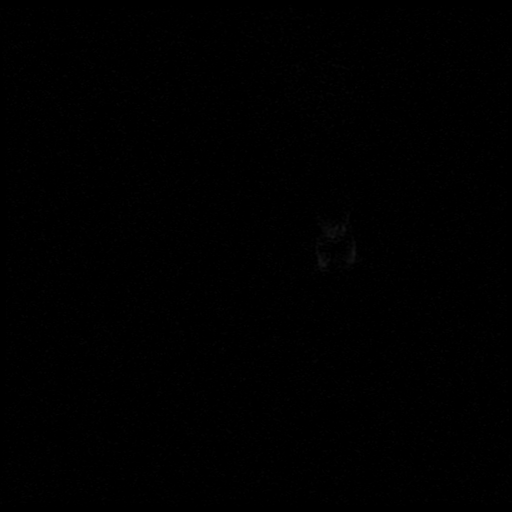

[Series 3: T2 · sagittal · 4.0mm · 0.33mm/px · 4 of 21 slices shown (1 of 2)]
[im 1/21]
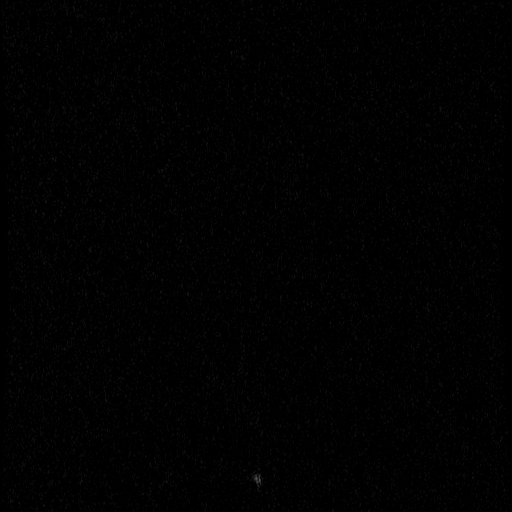
[im 7/21]
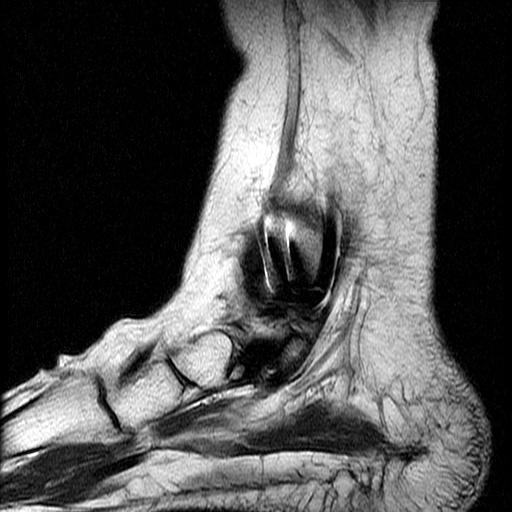
[im 14/21]
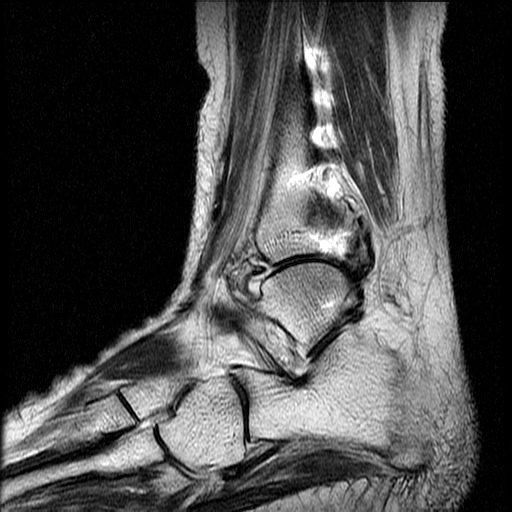
[im 21/21]
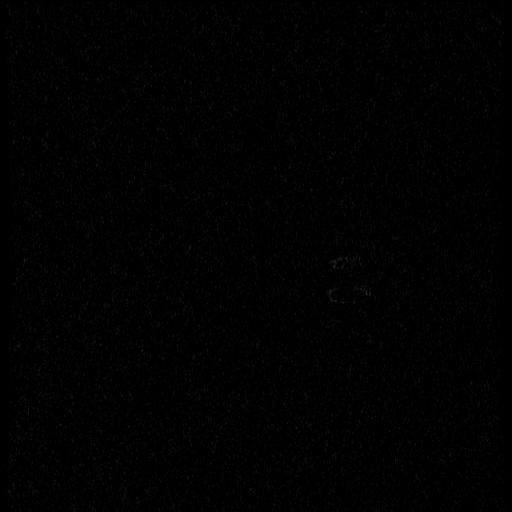

[Series 4: T1 · axial · 4.0mm · 0.59mm/px · z∈[-67,+96]mm · 7 of 35 slices shown (2 of 3)]
[im 1/35]
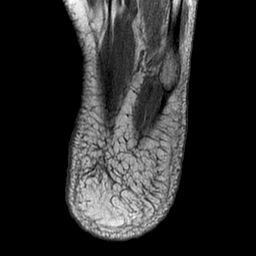
[im 6/35]
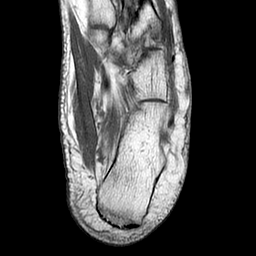
[im 12/35]
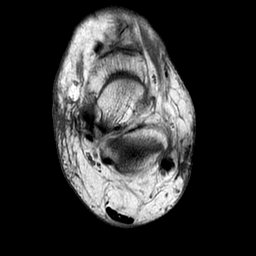
[im 18/35]
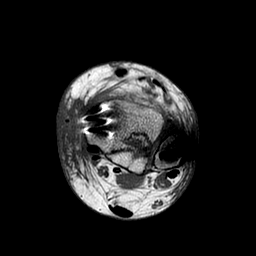
[im 23/35]
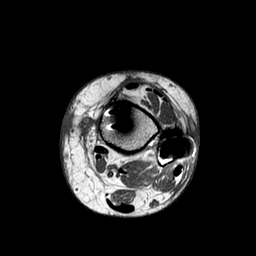
[im 29/35]
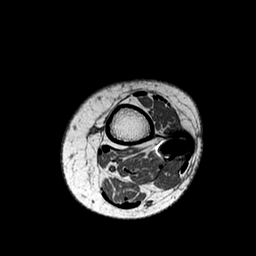
[im 35/35]
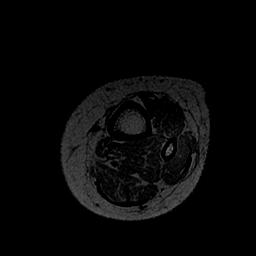

[Series 5: T2 · axial · 4.0mm · 0.29mm/px · z∈[-67,-43]mm · 2 of 35 slices shown (2 of 2)]
[im 1/35]
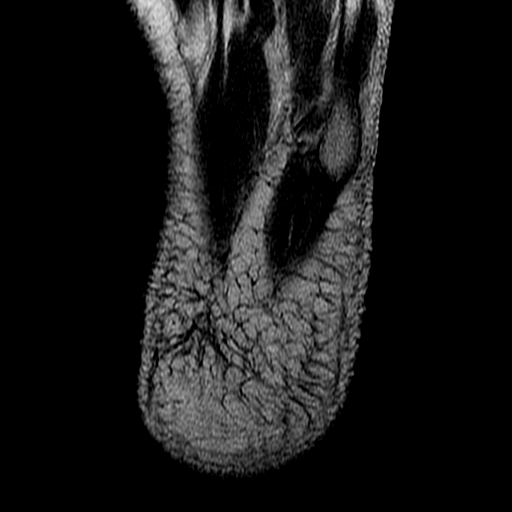
[im 6/35]
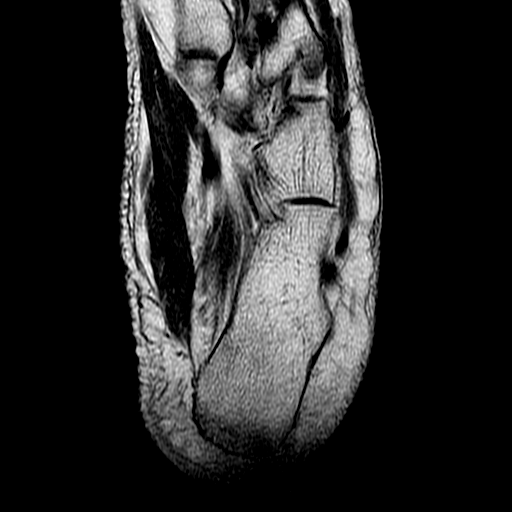

[Series 8: T1 · coronal · 4.0mm · 0.31mm/px · 5 of 27 slices shown (3 of 3)]
[im 1/27]
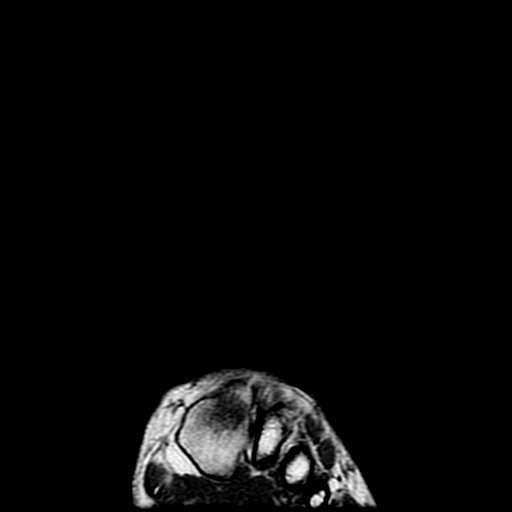
[im 7/27]
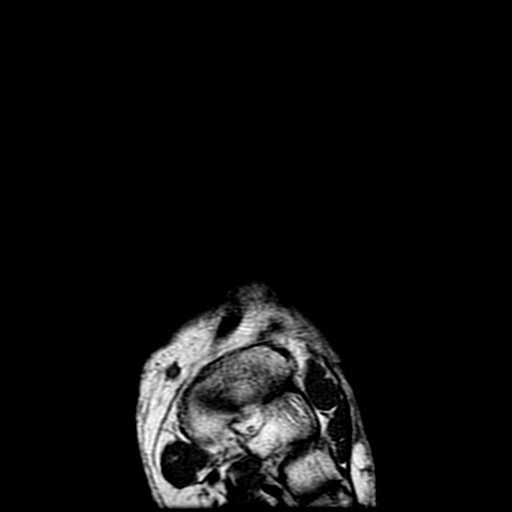
[im 14/27]
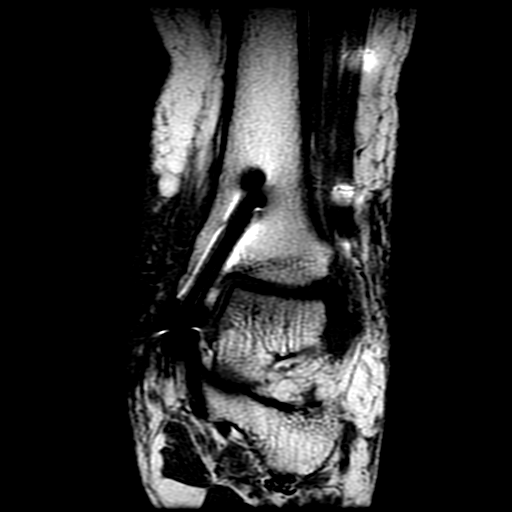
[im 20/27]
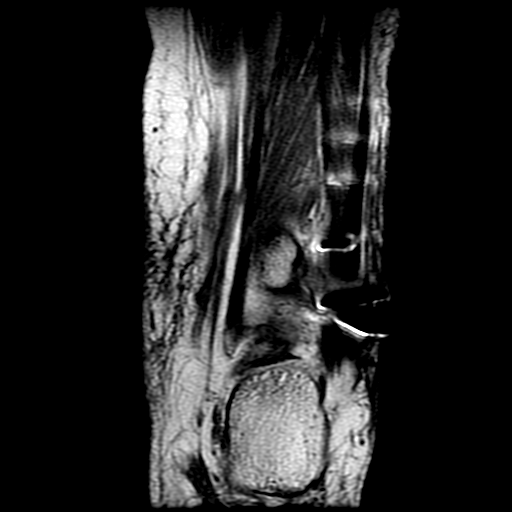
[im 27/27]
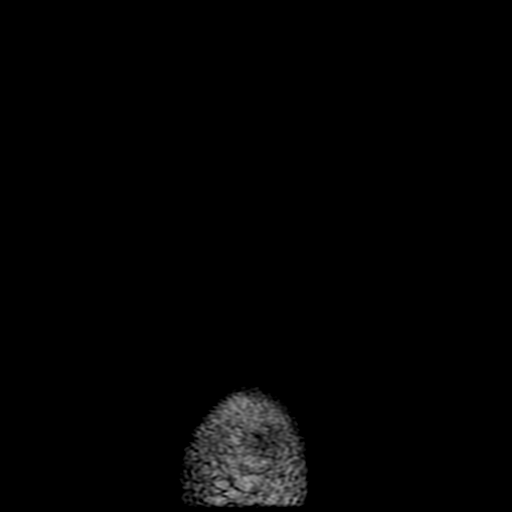

[23 of 40 positions shown; findings below may reference images not displayed]

FINDINGS: TENDONS

Peroneal: Intact and normally positioned.

Posteromedial: Intact and normally positioned. Evaluation mildly
limited by artifact from the surgical hardware.

Anterior: Intact and normally positioned.

Achilles: Intact.

Plantar Fascia: Intact.

LIGAMENTS

Lateral: Largely obscured by artifact from the fibular plate and
screws and suboptimally evaluated.

Medial: Partly obscured by artifact from the tibial screws. Grossly
intact.

CARTILAGE

Ankle Joint: Small ankle joint effusion. There is posttraumatic
deformity of the tibial plafond posteriorly related to the
previously demonstrated fracture. There is some articular surface
irregularity with minimal cortical step-off, best seen on the
sagittal images. Fracture line remains visible, although likely
largely healed. The talar dome appears normal.

Subtalar Joints/Sinus Tarsi: Unremarkable.

Bones: No significant extra-articular osseous findings. As above,
there is posttraumatic deformity of the tibial plafond posteriorly.
No acute osseous findings are seen.

Other: There is medial subcutaneous edema. No focal fluid
collections identified.
IMPRESSION: 1. Posttraumatic deformity of the tibial plafond posteriorly related
to previous ankle fracture dislocation and ORIF. There is mild
articular surface irregularity and cortical step-off.
[DATE] be helpful to better assess fracture healing if clinically
warranted.
3. Nonspecific medial subcutaneous edema.
4. No significant tendon findings. The ankle ligaments are largely
obscured by the surgical hardware.

## 2016-09-05 ENCOUNTER — Ambulatory Visit: Payer: PRIVATE HEALTH INSURANCE | Attending: Orthopedic Surgery | Admitting: Physical Therapy

## 2016-09-05 ENCOUNTER — Encounter: Payer: Self-pay | Admitting: Physical Therapy

## 2016-09-05 DIAGNOSIS — R269 Unspecified abnormalities of gait and mobility: Secondary | ICD-10-CM | POA: Insufficient documentation

## 2016-09-05 DIAGNOSIS — R262 Difficulty in walking, not elsewhere classified: Secondary | ICD-10-CM | POA: Insufficient documentation

## 2016-09-05 DIAGNOSIS — M25673 Stiffness of unspecified ankle, not elsewhere classified: Secondary | ICD-10-CM | POA: Diagnosis present

## 2016-09-05 DIAGNOSIS — R2232 Localized swelling, mass and lump, left upper limb: Secondary | ICD-10-CM | POA: Insufficient documentation

## 2016-09-05 DIAGNOSIS — M25572 Pain in left ankle and joints of left foot: Secondary | ICD-10-CM | POA: Insufficient documentation

## 2016-09-05 DIAGNOSIS — M25672 Stiffness of left ankle, not elsewhere classified: Secondary | ICD-10-CM | POA: Insufficient documentation

## 2016-09-05 NOTE — Therapy (Signed)
[place], [place], [address] Phone: [phone]   Fax:  [phone]  Physical Therapy Treatment  Patient Details  Name: Lisa Le MRN: [id number] Date of Birth: March 18, 1981 Referring Provider: Doran Durand  Encounter Date: 09/05/2016      PT End of Session - 09/05/16 1704    Visit Number 7   Number of Visits 12   Date for PT Re-Evaluation 10/11/16   PT Start Time 6503   PT Stop Time 1710   PT Time Calculation (min) 57 min   Activity Tolerance Patient tolerated treatment well   Behavior During Therapy [facility] for tasks assessed/performed      Past Medical History:  Diagnosis Date  . Complication of anesthesia    unable to urinate after ORIF ankle, had to insert Foley; had constipation x 1 week  . Dental crown present    tooth #9  . Post-traumatic arthritis of ankle, left 07/2016  . Retained orthopedic hardware 07/2016   left ankle  . Tension headache     Past Surgical History:  Procedure Laterality Date  . ANKLE ARTHROSCOPY Left 07/25/2016   Procedure: LEFT ANKLE ARTHROSCOPY WITH EXTENSIVE DEBRIDEMENT;  Surgeon: Wylene Simmer, MD;  Location: [place];  Service: Orthopedics;  Laterality: Left;  . HARDWARE REMOVAL Left 07/25/2016   Procedure: LEFT ANKLE REMOVAL OF DEEP IMPLANTS;  Surgeon: Wylene Simmer, MD;  Location: [place];  Service: Orthopedics;  Laterality: Left;  . ORIF ANKLE FRACTURE Left 12/27/2014   Procedure: OPEN REDUCTION INTERNAL FIXATION (ORIF) LEFT ANKLE FRACTURE;  Surgeon: Mcarthur Rossetti, MD;  Location: [place];  Service: Orthopedics;  Laterality: Left;    There were no vitals filed for this visit.      Subjective Assessment - 09/05/16 1656    Subjective No pain today.   Currently in Pain? No/denies                         [facility] Adult PT Treatment/Exercise - 09/05/16 0001      Ambulation/Gait   Ambulation/Gait Yes    Ambulation/Gait Assistance 7: Independent   Ambulation Distance (Feet) --  one lap around the building   Assistive device None   Gait Pattern Within Functional Limits;Step-through pattern   Ambulation Surface Level;Unlevel;Outdoor;Paved;Grass   Stairs Yes   Stairs Assistance 7: Independent   Stair Management Technique No rails;Alternating pattern   Number of Stairs 24   Height of Stairs 6     High Level Balance   High Level Balance Comments  tandem stance 3x30 sec on airex, step overs LLL x20, tandem walking     Vasopneumatic   Number Minutes Vasopneumatic  15 minutes   Vasopnuematic Location  Ankle   Vasopneumatic Pressure High   Vasopneumatic Temperature  35     Ankle Exercises: Seated   Other Seated Ankle Exercises partial lunges 2x10 each foot     Ankle Exercises: Standing   Other Standing Ankle Exercises step downs 6 in x20 LLE, lateral step ups 6 in x20 LLE, Step ups 8in x20 LLE leading and down with the RLE leading      Ankle Exercises: Stretches   Gastroc Stretch 1 rep;60 seconds  left foot                   PT Short Term Goals - 08/29/16 1446      PT SHORT TERM GOAL #1  Title Pt will be ind with HEP   Status Achieved           PT Long Term Goals - 09/05/16 1705      PT LONG TERM GOAL #1   Title ambulate all distances and most surfaces without difficulty   Baseline Patient was apprehensive and slowed speed on uneven surfaces   Period Weeks   Status Partially Met     PT LONG TERM GOAL #2   Title Pt will increase AROM of ankle DF to 10 degrees to improve gait mechanics   Baseline 5 degrees   Status On-going     PT LONG TERM GOAL #3   Title ascend and descend stairs step over step   Baseline descending patient used one hand support and had fear of falling, but ascending patient did step over step with [facility]   Period Weeks   Status Partially Met     PT LONG TERM GOAL #4   Title able to stand on the left leg only for 15 seconds   Status  Achieved               Plan - 09/05/16 1708    Clinical Impression Statement Patient tolerated treatment well but had increased pain in all exercises. Noticable shaking in both ankles during tandem exercises. Patient needed min. VC on keeping head up. Patient had apprehension during descending steps and uneven surfaces.    Rehab Potential Good   PT Frequency 2x / week   PT Duration 6 weeks   PT Treatment/Interventions ADLs/Self Care Home Management;Cryotherapy;Electrical Stimulation;Iontophoresis 37m/ml Dexamethasone;Moist Heat;Ultrasound;DME Instruction;Gait training;Stair training;Functional mobility training;Therapeutic activities;Therapeutic exercise;Balance training;Neuromuscular re-education;Patient/family education;Manual techniques;Scar mobilization;Passive range of motion;Vasopneumatic Device   PT Next Visit Plan Strengthen inversion/eversion and high level balance and proprioception.   Consulted and Agree with Plan of Care Patient      Patient will benefit from skilled therapeutic intervention in order to improve the following deficits and impairments:  Abnormal gait, Decreased activity tolerance, Decreased balance, Decreased coordination, Decreased endurance, Decreased mobility, Decreased range of motion, Decreased scar mobility, Decreased strength, Difficulty walking, Hypomobility, Increased edema, Pain  Visit Diagnosis: Pain in left ankle and joints of left foot  Localized swelling, mass and lump, left upper limb  Stiffness of left ankle, not elsewhere classified  Decreased range of motion of ankle     Problem List Patient Active Problem List   Diagnosis Date Noted  . Trimalleolar fracture of left ankle 12/27/2014  . Trimalleolar fracture of ankle, closed 12/27/2014    [name] SAlaska2/08/2016, 5:12 PM  [place] [address]Phone: [phone]  Fax:   [phone] Name: Lisa SolemMRN: [id number] of Birth: 2November 17, 1982

## 2016-09-09 ENCOUNTER — Ambulatory Visit: Payer: PRIVATE HEALTH INSURANCE | Admitting: Physical Therapy

## 2016-09-09 ENCOUNTER — Encounter: Payer: Self-pay | Admitting: Physical Therapy

## 2016-09-09 DIAGNOSIS — M25572 Pain in left ankle and joints of left foot: Secondary | ICD-10-CM | POA: Diagnosis not present

## 2016-09-09 DIAGNOSIS — M25673 Stiffness of unspecified ankle, not elsewhere classified: Secondary | ICD-10-CM

## 2016-09-09 DIAGNOSIS — R2232 Localized swelling, mass and lump, left upper limb: Secondary | ICD-10-CM

## 2016-09-09 DIAGNOSIS — M25672 Stiffness of left ankle, not elsewhere classified: Secondary | ICD-10-CM

## 2016-09-09 NOTE — Therapy (Signed)
[place], [place], [address] Phone: [phone]   Fax:  [phone]  Physical Therapy Treatment  Patient Details  Name: Lisa Le MRN: [id number] Date of Birth: 1981-07-10 Referring Provider: Doran Durand  Encounter Date: 09/09/2016      PT End of Session - 09/09/16 1335    Visit Number 8   Number of Visits 12   Date for PT Re-Evaluation 10/11/16   PT Start Time 1300   PT Stop Time 1350   PT Time Calculation (min) 50 min   Activity Tolerance Patient tolerated treatment well;Patient limited by pain   Behavior During Therapy [facility] for tasks assessed/performed      Past Medical History:  Diagnosis Date  . Complication of anesthesia    unable to urinate after ORIF ankle, had to insert Foley; had constipation x 1 week  . Dental crown present    tooth #9  . Post-traumatic arthritis of ankle, left 07/2016  . Retained orthopedic hardware 07/2016   left ankle  . Tension headache     Past Surgical History:  Procedure Laterality Date  . ANKLE ARTHROSCOPY Left 07/25/2016   Procedure: LEFT ANKLE ARTHROSCOPY WITH EXTENSIVE DEBRIDEMENT;  Surgeon: Wylene Simmer, MD;  Location: [place];  Service: Orthopedics;  Laterality: Left;  . HARDWARE REMOVAL Left 07/25/2016   Procedure: LEFT ANKLE REMOVAL OF DEEP IMPLANTS;  Surgeon: Wylene Simmer, MD;  Location: [place];  Service: Orthopedics;  Laterality: Left;  . ORIF ANKLE FRACTURE Left 12/27/2014   Procedure: OPEN REDUCTION INTERNAL FIXATION (ORIF) LEFT ANKLE FRACTURE;  Surgeon: Mcarthur Rossetti, MD;  Location: [place];  Service: Orthopedics;  Laterality: Left;    There were no vitals filed for this visit.      Subjective Assessment - 09/09/16 1300    Subjective Ankle feels tired and weak.   Currently in Pain? Yes   Pain Score 3    Pain Location Ankle   Pain Orientation Left                          [facility] Adult PT Treatment/Exercise - 09/09/16 0001      Lumbar Exercises: Aerobic   Elliptical 3 fwd/ 3 back      Vasopneumatic   Number Minutes Vasopneumatic  15 minutes   Vasopnuematic Location  Ankle   Vasopneumatic Pressure High   Vasopneumatic Temperature  35     Ankle Exercises: Standing   BAPS Standing;Level 2;10 reps   BAPS Limitations DF/PF/In/Ev and circle (CW/CCW)  2 sets   SLS 3x30sec on airex no hands LLE    Heel Raises 20 reps   Toe Raise 20 reps   Other Standing Ankle Exercises step downs 8 in x10 6 in x10  LLE, lateral step ups 8 in x20 LLE, Step ups 8in x20 LLE leading and down with the RLE leading    Other Standing Ankle Exercises partial lunges x10 each each leg leading on bosu                  PT Short Term Goals - 08/29/16 1446      PT SHORT TERM GOAL #1   Title Pt will be ind with HEP   Status Achieved           PT Long Term Goals - 09/05/16 1705      PT LONG TERM GOAL #1   Title ambulate all  distances and most surfaces without difficulty   Baseline Patient was apprehensive and slowed speed on uneven surfaces   Period Weeks   Status Partially Met     PT LONG TERM GOAL #2   Title Pt will increase AROM of ankle DF to 10 degrees to improve gait mechanics   Baseline 5 degrees   Status On-going     PT LONG TERM GOAL #3   Title ascend and descend stairs step over step   Baseline descending patient used one hand support and had fear of falling, but ascending patient did step over step with [facility]   Period Weeks   Status Partially Met     PT LONG TERM GOAL #4   Title able to stand on the left leg only for 15 seconds   Status Achieved               Plan - 09/09/16 1336    Clinical Impression Statement Patient tolerated treatment well but was limited by pain. Focus was on balance today and patients ankle fatigued quickly. Patient needed min. VC on proper posture and technique.    Rehab Potential Good   PT Frequency 2x / week    PT Duration 6 weeks   PT Treatment/Interventions ADLs/Self Care Home Management;Cryotherapy;Electrical Stimulation;Iontophoresis 36m/ml Dexamethasone;Moist Heat;Ultrasound;DME Instruction;Gait training;Stair training;Functional mobility training;Therapeutic activities;Therapeutic exercise;Balance training;Neuromuscular re-education;Patient/family education;Manual techniques;Scar mobilization;Passive range of motion;Vasopneumatic Device   PT Next Visit Plan  high level balance and proprioception.   Consulted and Agree with Plan of Care Patient      Patient will benefit from skilled therapeutic intervention in order to improve the following deficits and impairments:  Abnormal gait, Decreased activity tolerance, Decreased balance, Decreased coordination, Decreased endurance, Decreased mobility, Decreased range of motion, Decreased scar mobility, Decreased strength, Difficulty walking, Hypomobility, Increased edema, Pain  Visit Diagnosis: Pain in left ankle and joints of left foot  Localized swelling, mass and lump, left upper limb  Stiffness of left ankle, not elsewhere classified  Decreased range of motion of ankle     Problem List Patient Active Problem List   Diagnosis Date Noted  . Trimalleolar fracture of left ankle 12/27/2014  . Trimalleolar fracture of ankle, closed 12/27/2014    [name] SAlaska2/12/2016, 1:39 PM  [place] [address]Phone: [phone]  Fax:  [phone] Name: Lisa LeppanenMRN: [id number] of Birth: 210/24/1982

## 2016-09-10 ENCOUNTER — Ambulatory Visit: Payer: PRIVATE HEALTH INSURANCE | Admitting: Physical Therapy

## 2016-09-10 ENCOUNTER — Encounter: Payer: Self-pay | Admitting: Physical Therapy

## 2016-09-10 DIAGNOSIS — M25572 Pain in left ankle and joints of left foot: Secondary | ICD-10-CM

## 2016-09-10 DIAGNOSIS — R2232 Localized swelling, mass and lump, left upper limb: Secondary | ICD-10-CM

## 2016-09-10 DIAGNOSIS — M25673 Stiffness of unspecified ankle, not elsewhere classified: Secondary | ICD-10-CM

## 2016-09-10 DIAGNOSIS — M25672 Stiffness of left ankle, not elsewhere classified: Secondary | ICD-10-CM

## 2016-09-10 NOTE — Therapy (Signed)
[place], [place], [address] Phone: [phone]   Fax:  [phone]  Physical Therapy Treatment  Patient Details  Name: Karliah Kowalchuk MRN: [id number] Date of Birth: 16-Jul-1981 Referring Provider: Doran Durand  Encounter Date: 09/10/2016      PT End of Session - 09/10/16 1640    Visit Number 9   Number of Visits 12   Date for PT Re-Evaluation 10/11/16   PT Start Time 1600   PT Stop Time 1655   PT Time Calculation (min) 55 min   Activity Tolerance Patient tolerated treatment well   Behavior During Therapy [facility] for tasks assessed/performed      Past Medical History:  Diagnosis Date  . Complication of anesthesia    unable to urinate after ORIF ankle, had to insert Foley; had constipation x 1 week  . Dental crown present    tooth #9  . Post-traumatic arthritis of ankle, left 07/2016  . Retained orthopedic hardware 07/2016   left ankle  . Tension headache     Past Surgical History:  Procedure Laterality Date  . ANKLE ARTHROSCOPY Left 07/25/2016   Procedure: LEFT ANKLE ARTHROSCOPY WITH EXTENSIVE DEBRIDEMENT;  Surgeon: Wylene Simmer, MD;  Location: [place];  Service: Orthopedics;  Laterality: Left;  . HARDWARE REMOVAL Left 07/25/2016   Procedure: LEFT ANKLE REMOVAL OF DEEP IMPLANTS;  Surgeon: Wylene Simmer, MD;  Location: [place];  Service: Orthopedics;  Laterality: Left;  . ORIF ANKLE FRACTURE Left 12/27/2014   Procedure: OPEN REDUCTION INTERNAL FIXATION (ORIF) LEFT ANKLE FRACTURE;  Surgeon: Mcarthur Rossetti, MD;  Location: [place];  Service: Orthopedics;  Laterality: Left;    There were no vitals filed for this visit.      Subjective Assessment - 09/10/16 1604    Subjective "Alright, alright, the inside hurts I don't know why, I guess its just healing."   Pain Score 2    Pain Location Ankle   Pain Orientation Left            [facility] PT Assessment -  09/10/16 0001      AROM   Left Ankle Dorsiflexion 7   Left Ankle Plantar Flexion 46   Left Ankle Inversion 25   Left Ankle Eversion 20                     [facility] Adult PT Treatment/Exercise - 09/10/16 0001      Ambulation/Gait   Stairs Yes   Stairs Assistance 7: Independent   Stair Management Technique No rails;Alternating pattern   Number of Stairs 24   Height of Stairs 6     High Level Balance   High Level Balance Comments SLS on airex with ball toss x10 some assist needed     Lumbar Exercises: Aerobic   Elliptical 3 fwd/ 3 back      Vasopneumatic   Number Minutes Vasopneumatic  15 minutes   Vasopnuematic Location  Ankle   Vasopneumatic Pressure High   Vasopneumatic Temperature  35     Ankle Exercises: Standing   Heel Raises 20 reps   Toe Raise 20 reps   Other Standing Ankle Exercises sit to standing on dyna disk 3x5    Other Standing Ankle Exercises Toe and heel walking                  PT Short Term Goals - 08/29/16 1446      PT SHORT  TERM GOAL #1   Title Pt will be ind with HEP   Status Achieved           PT Long Term Goals - 09/10/16 1635      PT LONG TERM GOAL #1   Title ambulate all distances and most surfaces without difficulty   Status Partially Met     PT LONG TERM GOAL #2   Title Pt will increase AROM of ankle DF to 10 degrees to improve gait mechanics   Status Partially Met     PT LONG TERM GOAL #3   Title ascend and descend stairs step over step   Status Achieved     PT LONG TERM GOAL #4   Title able to stand on the left leg only for 15 seconds   Status Achieved               Plan - 09/10/16 1641    Clinical Impression Statement Pt has progressed well towards all goals. She reports some  ankle pain with SLS on airex. Pt very fatigue after negotiating 2 flights of stairs. Pt land hard on RLE when controlling stair descent with LLE but able to correct with cues.    Rehab Potential Good   PT Frequency 2x /  week   PT Duration 6 weeks   PT Treatment/Interventions ADLs/Self Care Home Management;Cryotherapy;Electrical Stimulation;Iontophoresis 87m/ml Dexamethasone;Moist Heat;Ultrasound;DME Instruction;Gait training;Stair training;Functional mobility training;Therapeutic activities;Therapeutic exercise;Balance training;Neuromuscular re-education;Patient/family education;Manual techniques;Scar mobilization;Passive range of motion;Vasopneumatic Device   PT Next Visit Plan  high level balance and proprioception. Pt goes to MD 09/11/2016      Patient will benefit from skilled therapeutic intervention in order to improve the following deficits and impairments:  Abnormal gait, Decreased activity tolerance, Decreased balance, Decreased coordination, Decreased endurance, Decreased mobility, Decreased range of motion, Decreased scar mobility, Decreased strength, Difficulty walking, Hypomobility, Increased edema, Pain  Visit Diagnosis: Pain in left ankle and joints of left foot  Localized swelling, mass and lump, left upper limb  Stiffness of left ankle, not elsewhere classified  Decreased range of motion of ankle     Problem List Patient Active Problem List   Diagnosis Date Noted  . Trimalleolar fracture of left ankle 12/27/2014  . Trimalleolar fracture of ankle, closed 12/27/2014    RScot Jun PTA 09/10/2016, 4:43 PM  [place] [address]Phone: [phone]  Fax:  [phone] Name: KBari LeibMRN: [id number] of Birth: 204-27-1982

## 2016-09-18 ENCOUNTER — Ambulatory Visit: Payer: PRIVATE HEALTH INSURANCE | Admitting: Physical Therapy

## 2016-09-18 DIAGNOSIS — M25672 Stiffness of left ankle, not elsewhere classified: Secondary | ICD-10-CM

## 2016-09-18 DIAGNOSIS — R262 Difficulty in walking, not elsewhere classified: Secondary | ICD-10-CM

## 2016-09-18 DIAGNOSIS — M25673 Stiffness of unspecified ankle, not elsewhere classified: Secondary | ICD-10-CM

## 2016-09-18 DIAGNOSIS — R269 Unspecified abnormalities of gait and mobility: Secondary | ICD-10-CM

## 2016-09-18 DIAGNOSIS — R2232 Localized swelling, mass and lump, left upper limb: Secondary | ICD-10-CM

## 2016-09-18 DIAGNOSIS — M25572 Pain in left ankle and joints of left foot: Secondary | ICD-10-CM

## 2016-09-18 NOTE — Therapy (Signed)
[place], [place], [address] Phone: [phone]   Fax:  [phone]  Physical Therapy Treatment  Patient Details  Name: Lisa Le MRN: [id number] Date of Birth: 27-Sep-1980 Referring Provider: Doran Durand  Encounter Date: 09/18/2016      PT End of Session - 09/18/16 1144    Visit Number 10   Number of Visits 12   Date for PT Re-Evaluation 10/11/16   PT Start Time 1100   PT Stop Time 1145   PT Time Calculation (min) 45 min   Activity Tolerance Patient tolerated treatment well   Behavior During Therapy [facility] for tasks assessed/performed      Past Medical History:  Diagnosis Date  . Complication of anesthesia    unable to urinate after ORIF ankle, had to insert Foley; had constipation x 1 week  . Dental crown present    tooth #9  . Post-traumatic arthritis of ankle, left 07/2016  . Retained orthopedic hardware 07/2016   left ankle  . Tension headache     Past Surgical History:  Procedure Laterality Date  . ANKLE ARTHROSCOPY Left 07/25/2016   Procedure: LEFT ANKLE ARTHROSCOPY WITH EXTENSIVE DEBRIDEMENT;  Surgeon: Wylene Simmer, MD;  Location: [place];  Service: Orthopedics;  Laterality: Left;  . HARDWARE REMOVAL Left 07/25/2016   Procedure: LEFT ANKLE REMOVAL OF DEEP IMPLANTS;  Surgeon: Wylene Simmer, MD;  Location: [place];  Service: Orthopedics;  Laterality: Left;  . ORIF ANKLE FRACTURE Left 12/27/2014   Procedure: OPEN REDUCTION INTERNAL FIXATION (ORIF) LEFT ANKLE FRACTURE;  Surgeon: Mcarthur Rossetti, MD;  Location: [place];  Service: Orthopedics;  Laterality: Left;    There were no vitals filed for this visit.      Subjective Assessment - 09/18/16 1141    Subjective Pt reporting having a good doctors visit and he wanted her to continue therapy for another month. No pain at rest.    Patient Stated Goals walk without any issues   Currently in Pain?  No/denies                         [facility] Adult PT Treatment/Exercise - 09/18/16 0001      Lumbar Exercises: Aerobic   Elliptical 2 fwd/ 2 back     Vasopneumatic   Number Minutes Vasopneumatic  15 minutes   Vasopnuematic Location  Ankle   Vasopneumatic Pressure High   Vasopneumatic Temperature  34     Ankle Exercises: Standing   Heel Walk (Round Trip) 15 feet x 2   Toe Walk (Round Trip) 15 feet x 2   Braiding (Round Trip) 15 feet x2 both directions   Other Standing Ankle Exercises sit to standing on dyna disk 3x5, Standing on BOSU ball dome down 2 minutes with intermittent UE support, BOSU dome up, 2 minutes with intermittent UE support, SLS on L LE, staggered stance on blue discs 1 minute with each foot forward   Other Standing Ankle Exercises Toe and heel walking                  PT Short Term Goals - 09/18/16 1149      PT SHORT TERM GOAL #1   Title Pt will be ind with HEP   Time 2   Period Weeks   Status Achieved     PT SHORT TERM GOAL #2   Title Pt will increase to FWB in ASO  without walker   Time 2   Period Weeks   Status Achieved           PT Long Term Goals - 09/18/16 1149      PT LONG TERM GOAL #1   Title ambulate all distances and most surfaces without difficulty   Baseline Patient was apprehensive and slowed speed on uneven surfaces   Time 6   Status Partially Met     PT LONG TERM GOAL #2   Title Pt will increase AROM of ankle DF to 10 degrees to improve gait mechanics   Baseline 5 degrees   Time 6   Period Weeks   Status Partially Met     PT LONG TERM GOAL #3   Title ascend and descend stairs step over step   Baseline descending patient used one hand support and had fear of falling, but ascending patient did step over step with NHS   Time 6   Period Weeks   Status Achieved     PT LONG TERM GOAL #4   Title able to stand on the left leg only for 15 seconds   Baseline needs one hand support but can maintain for 30 secs    Period Weeks   Status Achieved               Plan - 09/18/16 1145    Clinical Impression Statement Pt is making great progress with therapy. She reporting no pain at rest, but increased swelling throughout the day. Pt still having some difficutly with stairs climbing. skilled PT needed to address pt's impairments and progress toward goals set.    Rehab Potential Good   PT Frequency 2x / week   PT Duration 6 weeks   PT Treatment/Interventions ADLs/Self Care Home Management;Cryotherapy;Electrical Stimulation;Iontophoresis 57m/ml Dexamethasone;Moist Heat;Ultrasound;DME Instruction;Gait training;Stair training;Functional mobility training;Therapeutic activities;Therapeutic exercise;Balance training;Neuromuscular re-education;Patient/family education;Manual techniques;Scar mobilization;Passive range of motion;Vasopneumatic Device   PT Next Visit Plan  high level balance and proprioception.  Continue stair training   Consulted and Agree with Plan of Care Patient      Patient will benefit from skilled therapeutic intervention in order to improve the following deficits and impairments:  Abnormal gait, Decreased activity tolerance, Decreased balance, Decreased coordination, Decreased endurance, Decreased mobility, Decreased range of motion, Decreased scar mobility, Decreased strength, Difficulty walking, Hypomobility, Increased edema, Pain  Visit Diagnosis: Pain in left ankle and joints of left foot  Localized swelling, mass and lump, left upper limb  Stiffness of left ankle, not elsewhere classified  Decreased range of motion of ankle  Ankle stiffness, left  Difficulty in walking, not elsewhere classified  Acute left ankle pain  Abnormality of gait     Problem List Patient Active Problem List   Diagnosis Date Noted  . Trimalleolar fracture of left ankle 12/27/2014  . Trimalleolar fracture of ankle, closed 12/27/2014    JOretha Caprice MPT 09/18/2016, 11:51 AM  [place] [address] [address]Phone: [phone]  Fax:  [phone] Name: KMelvina PangelinanMRN: [id number] of Birth: 213-Dec-1982

## 2016-09-20 ENCOUNTER — Ambulatory Visit: Payer: PRIVATE HEALTH INSURANCE | Admitting: Physical Therapy

## 2016-09-20 ENCOUNTER — Encounter: Payer: Self-pay | Admitting: Physical Therapy

## 2016-09-20 DIAGNOSIS — M25572 Pain in left ankle and joints of left foot: Secondary | ICD-10-CM

## 2016-09-20 DIAGNOSIS — M25672 Stiffness of left ankle, not elsewhere classified: Secondary | ICD-10-CM

## 2016-09-20 DIAGNOSIS — M25673 Stiffness of unspecified ankle, not elsewhere classified: Secondary | ICD-10-CM

## 2016-09-20 DIAGNOSIS — R2232 Localized swelling, mass and lump, left upper limb: Secondary | ICD-10-CM

## 2016-09-20 NOTE — Therapy (Signed)
[facility] [address] [place], [place], [address] Phone: [phone]   Fax:  [phone]  Physical Therapy Treatment  Patient Details  Name: Lisa GrahamKarima Andreason MRN: [id number] Date of Birth: 07/19/1981 Referring Provider: Victorino DikeHewitt  Encounter Date: 09/20/2016      PT End of Session - 09/20/16 1020    Visit Number 11   Number of Visits 12   Date for PT Re-Evaluation 10/11/16   PT Start Time 0930   PT Stop Time 1025   PT Time Calculation (min) 55 min   Activity Tolerance Patient tolerated treatment well   Behavior During Therapy [facility] for tasks assessed/performed      Past Medical History:  Diagnosis Date  . Complication of anesthesia    unable to urinate after ORIF ankle, had to insert Foley; had constipation x 1 week  . Dental crown present    tooth #9  . Post-traumatic arthritis of ankle, left 07/2016  . Retained orthopedic hardware 07/2016   left ankle  . Tension headache     Past Surgical History:  Procedure Laterality Date  . ANKLE ARTHROSCOPY Left 07/25/2016   Procedure: LEFT ANKLE ARTHROSCOPY WITH EXTENSIVE DEBRIDEMENT;  Surgeon: Toni ArthursJohn Hewitt, MD;  Location: [facility];  Service: Orthopedics;  Laterality: Left;  . HARDWARE REMOVAL Left 07/25/2016   Procedure: LEFT ANKLE REMOVAL OF DEEP IMPLANTS;  Surgeon: Toni ArthursJohn Hewitt, MD;  Location: [facility];  Service: Orthopedics;  Laterality: Left;  . ORIF ANKLE FRACTURE Left 12/27/2014   Procedure: OPEN REDUCTION INTERNAL FIXATION (ORIF) LEFT ANKLE FRACTURE;  Surgeon: Kathryne Hitchhristopher Y Blackman, MD;  [facility];  Service: Orthopedics;  Laterality: Left;    There were no vitals filed for this visit.      Subjective Assessment - 09/20/16 0929    Subjective Feels good. A little pain not much, and achy and sore after last treatment.    Pain Score 3                          [facility] Adult PT Treatment/Exercise - 09/20/16  0001      Ambulation/Gait   Ambulation/Gait Yes   Ambulation/Gait Assistance 7: Independent   Ambulation Distance (Feet) 900 Feet   Assistive device None   Gait Pattern Step-through pattern   Ambulation Surface Level;Unlevel;Indoor;Outdoor;Paved;Grass   Stairs Yes   Stairs Assistance 7: Independent   Stair Management Technique No rails;Alternating pattern   Number of Stairs 24   Height of Stairs 6     High Level Balance   High Level Balance Comments SLS on disc, squats on bosu, standing on blue disc laterally and step stance, calf raises on trampoline, heel walking       Vasopneumatic   Number Minutes Vasopneumatic  15 minutes   Vasopnuematic Location  Ankle   Vasopneumatic Pressure High   Vasopneumatic Temperature  34                  PT Short Term Goals - 09/18/16 1149      PT SHORT TERM GOAL #1   Title Pt will be ind with HEP   Time 2   Period Weeks   Status Achieved     PT SHORT TERM GOAL #2   Title Pt will increase to FWB in ASO without walker   Time 2   Period Weeks   Status Achieved  PT Long Term Goals - 09/20/16 1055      PT LONG TERM GOAL #1   Title ambulate all distances and most surfaces without difficulty   Baseline Patient slows down on uneven terrain   Status On-going     PT LONG TERM GOAL #2   Title Pt will increase AROM of ankle DF to 10 degrees to improve gait mechanics   Baseline 8 degrees   Status On-going               Plan - 09/20/16 1021    Clinical Impression Statement Patient tolerated treatment well and had left ankle heel pain during calf raises on trampoline and during SLS activities patients had lateral Left ankle pain. Patient has noticeble balance deficits. During ambulatation on all surfaces patient had a wobbly left ankle on uneven terrian and slowed her speed. Patient completed steps with step over pattern and no hand support.   Rehab Potential Good   PT Frequency 2x / week   PT Duration 6 weeks    PT Treatment/Interventions ADLs/Self Care Home Management;Cryotherapy;Electrical Stimulation;Iontophoresis 4mg /ml Dexamethasone;Moist Heat;Ultrasound;DME Instruction;Gait training;Stair training;Functional mobility training;Therapeutic activities;Therapeutic exercise;Balance training;Neuromuscular re-education;Patient/family education;Manual techniques;Scar mobilization;Passive range of motion;Vasopneumatic Device   PT Next Visit Plan  high level balance and proprioception.  Continue stair training      Patient will benefit from skilled therapeutic intervention in order to improve the following deficits and impairments:  Abnormal gait, Decreased activity tolerance, Decreased balance, Decreased coordination, Decreased endurance, Decreased mobility, Decreased range of motion, Decreased scar mobility, Decreased strength, Difficulty walking, Hypomobility, Increased edema, Pain  Visit Diagnosis: Pain in left ankle and joints of left foot  Localized swelling, mass and lump, left upper limb  Stiffness of left ankle, not elsewhere classified  Decreased range of motion of ankle     Problem List Patient Active Problem List   Diagnosis Date Noted  . Trimalleolar fracture of left ankle 12/27/2014  . Trimalleolar fracture of ankle, closed 12/27/2014    Phillips Le, Lisa 09/20/2016, 10:57 AM  [facility] [place] [address] [place], [place], [address] Phone: [phone]   Fax:  [phone]  Name: Lisa Le MRN: [id number] Date of Birth: 09-Oct-1980

## 2016-09-24 ENCOUNTER — Ambulatory Visit: Payer: PRIVATE HEALTH INSURANCE | Admitting: Physical Therapy

## 2016-09-24 ENCOUNTER — Encounter: Payer: Self-pay | Admitting: Physical Therapy

## 2016-09-24 DIAGNOSIS — M25572 Pain in left ankle and joints of left foot: Secondary | ICD-10-CM | POA: Diagnosis not present

## 2016-09-24 DIAGNOSIS — R2232 Localized swelling, mass and lump, left upper limb: Secondary | ICD-10-CM

## 2016-09-24 DIAGNOSIS — M25672 Stiffness of left ankle, not elsewhere classified: Secondary | ICD-10-CM

## 2016-09-24 NOTE — Therapy (Signed)
[facility] [address] [place], [place], [address] Phone: [phone]   Fax:  [phone]  Physical Therapy Treatment  Patient Details  Name: Lisa GrahamKarima Le MRN: [id number] Date of Birth: 10/17/1980 Referring Provider: Victorino DikeHewitt  Encounter Date: 09/24/2016      PT End of Session - 09/24/16 1336    Visit Number 12   Number of Visits 12   Date for PT Re-Evaluation 10/11/16   PT Start Time 1257   PT Stop Time 1353   PT Time Calculation (min) 56 min   Activity Tolerance Patient tolerated treatment well   Behavior During Therapy [facility] for tasks assessed/performed      Past Medical History:  Diagnosis Date  . Complication of anesthesia    unable to urinate after ORIF ankle, had to insert Foley; had constipation x 1 week  . Dental crown present    tooth #9  . Post-traumatic arthritis of ankle, left 07/2016  . Retained orthopedic hardware 07/2016   left ankle  . Tension headache     Past Surgical History:  Procedure Laterality Date  . ANKLE ARTHROSCOPY Left 07/25/2016   Procedure: LEFT ANKLE ARTHROSCOPY WITH EXTENSIVE DEBRIDEMENT;  Surgeon: Toni ArthursJohn Hewitt, MD;  Location: [facility];  Service: Orthopedics;  Laterality: Left;  . HARDWARE REMOVAL Left 07/25/2016   Procedure: LEFT ANKLE REMOVAL OF DEEP IMPLANTS;  Surgeon: Toni ArthursJohn Hewitt, MD;  Location: [facility];  Service: Orthopedics;  Laterality: Left;  . ORIF ANKLE FRACTURE Left 12/27/2014   Procedure: OPEN REDUCTION INTERNAL FIXATION (ORIF) LEFT ANKLE FRACTURE;  Surgeon: Kathryne Hitchhristopher Y Blackman, MD;  [facility];  Service: Orthopedics;  Laterality: Left;    There were no vitals filed for this visit.      Subjective Assessment - 09/24/16 1300    Subjective "Swelling and pain "   Currently in Pain? Yes   Pain Score 3    Pain Location Ankle   Pain Orientation Left                         [facility] Adult PT  Treatment/Exercise - 09/24/16 0001      Ambulation/Gait   Stairs Yes   Stairs Assistance 7: Independent   Stair Management Technique No rails;Alternating pattern   Number of Stairs 24   Height of Stairs 6   Gait Comments 2.5 flights      High Level Balance   High Level Balance Comments heel and toe walking,      Lumbar Exercises: Aerobic   Elliptical 3 fwd/ 3 back     Vasopneumatic   Number Minutes Vasopneumatic  15 minutes   Vasopnuematic Location  Ankle   Vasopneumatic Pressure High   Vasopneumatic Temperature  34     Ankle Exercises: Standing   Other Standing Ankle Exercises Heel raises on airex 2x20; resisted side step 50lb x5 each, SLS R on airex with rebound ball toss x10     Ankle Exercises: Seated   Other Seated Ankle Exercises sit to stand on Le's on dyna disk 2x10     Ankle Exercises: Stretches   Gastroc Stretch 3 reps;10 seconds                  PT Short Term Goals - 09/18/16 1149      PT SHORT TERM GOAL #1   Title Pt will be ind with HEP   Time 2   Period Weeks  Status Achieved     PT SHORT TERM GOAL #2   Title Pt will increase to FWB in ASO without walker   Time 2   Period Weeks   Status Achieved           PT Long Term Goals - 09/20/16 1055      PT LONG TERM GOAL #1   Title ambulate all distances and most surfaces without difficulty   Baseline Patient slows down on uneven terrain   Status On-going     PT LONG TERM GOAL #2   Title Pt will increase AROM of ankle DF to 10 degrees to improve gait mechanics   Baseline 8 degrees   Status On-going               Plan - 09/24/16 1337    Clinical Impression Statement Pt with some instability with single leg on aires. Pt able to complete all exercises, but has a decrease activity tolerance with functional interventions. Pt reports pain with resisted side steps.   Rehab Potential Good   PT Frequency 2x / week   PT Duration 6 weeks   PT Treatment/Interventions ADLs/Self Care  Home Management;Cryotherapy;Electrical Stimulation;Iontophoresis 4mg /ml Dexamethasone;Moist Heat;Ultrasound;DME Instruction;Gait training;Stair training;Functional mobility training;Therapeutic activities;Therapeutic exercise;Balance training;Neuromuscular re-education;Patient/family education;Manual techniques;Scar mobilization;Passive range of motion;Vasopneumatic Device   PT Next Visit Plan  high level balance and proprioception.  Continue stair training      Patient will benefit from skilled therapeutic intervention in order to improve the following deficits and impairments:  Abnormal gait, Decreased activity tolerance, Decreased balance, Decreased coordination, Decreased endurance, Decreased mobility, Decreased range of motion, Decreased scar mobility, Decreased strength, Difficulty walking, Hypomobility, Increased edema, Pain  Visit Diagnosis: Pain in left ankle and joints of left foot  Stiffness of left ankle, not elsewhere classified  Localized swelling, mass and lump, left upper limb  Ankle stiffness, left     Problem List Patient Active Problem List   Diagnosis Date Noted  . Trimalleolar fracture of left ankle 12/27/2014  . Trimalleolar fracture of ankle, closed 12/27/2014    Grayce Sessions 09/24/2016, 1:39 PM  [facility] [place] [address] [place], [place], [address] Phone: [phone]   Fax:  [phone]  Name: Lisa Le MRN: [id number] Date of Birth: July 21, 1981

## 2016-09-26 ENCOUNTER — Ambulatory Visit: Payer: PRIVATE HEALTH INSURANCE | Admitting: Physical Therapy

## 2016-09-26 ENCOUNTER — Encounter: Payer: Self-pay | Admitting: Physical Therapy

## 2016-09-26 DIAGNOSIS — M25572 Pain in left ankle and joints of left foot: Secondary | ICD-10-CM

## 2016-09-26 DIAGNOSIS — M25672 Stiffness of left ankle, not elsewhere classified: Secondary | ICD-10-CM

## 2016-09-26 DIAGNOSIS — R2232 Localized swelling, mass and lump, left upper limb: Secondary | ICD-10-CM

## 2016-09-26 NOTE — Therapy (Signed)
[place], [place], [address] Phone: [phone]   Fax:  [phone]  Physical Therapy Treatment  Patient Details  Name: Nonna Renninger MRN: [id number] Date of Birth: 12-Sep-1980 Referring Provider: Doran Durand  Encounter Date: 09/26/2016      PT End of Session - 09/26/16 1340    Visit Number 13   Date for PT Re-Evaluation 10/11/16   PT Start Time 1300   PT Stop Time 1355   PT Time Calculation (min) 55 min   Activity Tolerance Patient tolerated treatment well   Behavior During Therapy [facility] for tasks assessed/performed      Past Medical History:  Diagnosis Date  . Complication of anesthesia    unable to urinate after ORIF ankle, had to insert Foley; had constipation x 1 week  . Dental crown present    tooth #9  . Post-traumatic arthritis of ankle, left 07/2016  . Retained orthopedic hardware 07/2016   left ankle  . Tension headache     Past Surgical History:  Procedure Laterality Date  . ANKLE ARTHROSCOPY Left 07/25/2016   Procedure: LEFT ANKLE ARTHROSCOPY WITH EXTENSIVE DEBRIDEMENT;  Surgeon: Wylene Simmer, MD;  Location: [place];  Service: Orthopedics;  Laterality: Left;  . HARDWARE REMOVAL Left 07/25/2016   Procedure: LEFT ANKLE REMOVAL OF DEEP IMPLANTS;  Surgeon: Wylene Simmer, MD;  Location: [place];  Service: Orthopedics;  Laterality: Left;  . ORIF ANKLE FRACTURE Left 12/27/2014   Procedure: OPEN REDUCTION INTERNAL FIXATION (ORIF) LEFT ANKLE FRACTURE;  Surgeon: Mcarthur Rossetti, MD;  Location: [place];  Service: Orthopedics;  Laterality: Left;    There were no vitals filed for this visit.      Subjective Assessment - 09/26/16 1300    Subjective "The usual"   Currently in Pain? Yes   Pain Score 2    Pain Location Ankle   Pain Orientation Left            [facility] PT Assessment - 09/26/16 0001      AROM   Left Ankle Dorsiflexion 11   Left  Ankle Plantar Flexion 46   Left Ankle Inversion 25   Left Ankle Eversion 20                     [facility] Adult PT Treatment/Exercise - 09/26/16 0001      Ambulation/Gait   Gait Comments One lap around building, up and down slopes, Pt reports lateral  ankle pain going up hill      High Level Balance   High Level Balance Comments heel and toe walking,      Lumbar Exercises: Aerobic   Elliptical 3 fwd/ 3 back     Vasopneumatic   Number Minutes Vasopneumatic  15 minutes   Vasopnuematic Location  Ankle   Vasopneumatic Pressure High   Vasopneumatic Temperature  34     Ankle Exercises: Standing   Other Standing Ankle Exercises Heel raises on black bar 2x20; resisted side step 50lb x5 each      Ankle Exercises: Stretches   Gastroc Stretch 3 reps;10 seconds                  PT Short Term Goals - 09/18/16 1149      PT SHORT TERM GOAL #1   Title Pt will be ind with HEP   Time 2   Period Weeks   Status Achieved     PT  SHORT TERM GOAL #2   Title Pt will increase to FWB in ASO without walker   Time 2   Period Weeks   Status Achieved           PT Long Term Goals - 09/26/16 1329      PT LONG TERM GOAL #1   Title ambulate all distances and most surfaces without difficulty   Status Partially Met     PT LONG TERM GOAL #2   Title Pt will increase AROM of ankle DF to 10 degrees to improve gait mechanics   Status Achieved     PT LONG TERM GOAL #3   Title ascend and descend stairs step over step   Status Achieved     PT LONG TERM GOAL #4   Title able to stand on the left leg only for 15 seconds   Status Achieved               Plan - 09/26/16 1343    Clinical Impression Statement Pt had progressed and completed most of her goals. Pt does report lateral L ankle pain with up hill ambulation and with resisted side steps.    Rehab Potential Good   PT Frequency 2x / week   PT Duration 6 weeks   PT Treatment/Interventions ADLs/Self Care Home  Management;Cryotherapy;Electrical Stimulation;Iontophoresis 47m/ml Dexamethasone;Moist Heat;Ultrasound;DME Instruction;Gait training;Stair training;Functional mobility training;Therapeutic activities;Therapeutic exercise;Balance training;Neuromuscular re-education;Patient/family education;Manual techniques;Scar mobilization;Passive range of motion;Vasopneumatic Device   PT Next Visit Plan  high level balance and proprioception.  Continue stair training and ice for swelling      Patient will benefit from skilled therapeutic intervention in order to improve the following deficits and impairments:  Abnormal gait, Decreased activity tolerance, Decreased balance, Decreased coordination, Decreased endurance, Decreased mobility, Decreased range of motion, Decreased scar mobility, Decreased strength, Difficulty walking, Hypomobility, Increased edema, Pain  Visit Diagnosis: Pain in left ankle and joints of left foot  Stiffness of left ankle, not elsewhere classified  Ankle stiffness, left  Localized swelling, mass and lump, left upper limb     Problem List Patient Active Problem List   Diagnosis Date Noted  . Trimalleolar fracture of left ankle 12/27/2014  . Trimalleolar fracture of ankle, closed 12/27/2014    RScot Jun2/22/2018, 1:44 PM  [place] [address]Phone: [phone]  Fax:  [phone] Name: KTaletha TwifordMRN: [id number] of Birth: 208/20/1982

## 2016-10-01 ENCOUNTER — Ambulatory Visit: Payer: PRIVATE HEALTH INSURANCE | Admitting: Physical Therapy

## 2016-10-01 ENCOUNTER — Encounter: Payer: Self-pay | Admitting: Physical Therapy

## 2016-10-01 DIAGNOSIS — R2232 Localized swelling, mass and lump, left upper limb: Secondary | ICD-10-CM

## 2016-10-01 DIAGNOSIS — M25572 Pain in left ankle and joints of left foot: Secondary | ICD-10-CM

## 2016-10-01 DIAGNOSIS — M25672 Stiffness of left ankle, not elsewhere classified: Secondary | ICD-10-CM

## 2016-10-01 NOTE — Therapy (Addendum)
[place], [place], [address] Phone: [phone]   Fax:  [phone]  Physical Therapy Treatment  Patient Details  Name: Lisa Le MRN: [id number] Date of Birth: 12/28/1980 Referring Provider: Doran Durand  Encounter Date: 10/01/2016      PT End of Session - 10/01/16 1323    Visit Number 14   Date for PT Re-Evaluation 10/11/16   PT Start Time 1300   PT Stop Time 1323   PT Time Calculation (min) 23 min      Past Medical History:  Diagnosis Date  . Complication of anesthesia    unable to urinate after ORIF ankle, had to insert Foley; had constipation x 1 week  . Dental crown present    tooth #9  . Post-traumatic arthritis of ankle, left 07/2016  . Retained orthopedic hardware 07/2016   left ankle  . Tension headache     Past Surgical History:  Procedure Laterality Date  . ANKLE ARTHROSCOPY Left 07/25/2016   Procedure: LEFT ANKLE ARTHROSCOPY WITH EXTENSIVE DEBRIDEMENT;  Surgeon: Wylene Simmer, MD;  Location: [place];  Service: Orthopedics;  Laterality: Left;  . HARDWARE REMOVAL Left 07/25/2016   Procedure: LEFT ANKLE REMOVAL OF DEEP IMPLANTS;  Surgeon: Wylene Simmer, MD;  Location: [place];  Service: Orthopedics;  Laterality: Left;  . ORIF ANKLE FRACTURE Left 12/27/2014   Procedure: OPEN REDUCTION INTERNAL FIXATION (ORIF) LEFT ANKLE FRACTURE;  Surgeon: Mcarthur Rossetti, MD;  Location: [place];  Service: Orthopedics;  Laterality: Left;    There were no vitals filed for this visit.      Subjective Assessment - 10/01/16 1301    Subjective 'Everything is good, but pain on the outside'   Currently in Pain? Yes   Pain Score 3    Pain Location Ankle   Pain Orientation Left                         [facility] Adult PT Treatment/Exercise - 10/01/16 0001      Ambulation/Gait   Ambulation/Gait Yes   Ambulation/Gait Assistance 7: Independent    Ambulation Distance (Feet) 900 Feet   Assistive device None   Gait Pattern Step-through pattern   Ambulation Surface Unlevel;Paved   Gait Comments Two laps around building, up and down slopes, Pt reports lateral  ankle pain going up hill      Lumbar Exercises: Aerobic   Elliptical 3 fwd/ 3 back                  PT Short Term Goals - 09/18/16 1149      PT SHORT TERM GOAL #1   Title Pt will be ind with HEP   Time 2   Period Weeks   Status Achieved     PT SHORT TERM GOAL #2   Title Pt will increase to FWB in ASO without walker   Time 2   Period Weeks   Status Achieved           PT Long Term Goals - 10/01/16 1322      PT LONG TERM GOAL #1   Title ambulate all distances and most surfaces without difficulty   Status Achieved     PT LONG TERM GOAL #2   Title Pt will increase AROM of ankle DF to 10 degrees to improve gait mechanics   Status Achieved     PT LONG TERM GOAL #3  Title ascend and descend stairs step over step   Status Achieved     PT LONG TERM GOAL #4   Title able to stand on the left leg only for 15 seconds   Status Achieved               Plan - 10/01/16 1322    Clinical Impression Statement Pt has progressed completing all of her goals. Pt reports no functional limitations only Lateral ankle pain.   Rehab Potential Good   PT Frequency 2x / week   PT Duration 6 weeks   PT Next Visit Plan D/C      Patient will benefit from skilled therapeutic intervention in order to improve the following deficits and impairments:  Abnormal gait, Decreased activity tolerance, Decreased balance, Decreased coordination, Decreased endurance, Decreased mobility, Decreased range of motion, Decreased scar mobility, Decreased strength, Difficulty walking, Hypomobility, Increased edema, Pain  Visit Diagnosis: Pain in left ankle and joints of left foot  Ankle stiffness, left  Localized swelling, mass and lump, left upper limb  Stiffness of left  ankle, not elsewhere classified     Problem List Patient Active Problem List   Diagnosis Date Noted  . Trimalleolar fracture of left ankle 12/27/2014  . Trimalleolar fracture of ankle, closed 12/27/2014   PHYSICAL THERAPY DISCHARGE SUMMARY  Visits from Start of Care: 14 Plan: Patient agrees to discharge.  Patient goals were met. Patient is being discharged due to meeting the stated rehab goals.  ?????       Scot Jun, PTA 10/01/2016, 1:23 PM  [place], [place], [address] Phone: [phone]   Fax:  [phone]  Name: Corneisha Alvi MRN: [id number] Date of Birth: 1981-04-13

## 2016-10-03 ENCOUNTER — Ambulatory Visit: Payer: PRIVATE HEALTH INSURANCE | Admitting: Physical Therapy

## 2016-10-08 ENCOUNTER — Ambulatory Visit: Payer: PRIVATE HEALTH INSURANCE | Admitting: Physical Therapy

## 2016-10-10 ENCOUNTER — Ambulatory Visit: Payer: PRIVATE HEALTH INSURANCE | Admitting: Physical Therapy

## 2016-11-11 ENCOUNTER — Telehealth (INDEPENDENT_AMBULATORY_CARE_PROVIDER_SITE_OTHER): Payer: Self-pay | Admitting: Orthopaedic Surgery

## 2016-11-11 NOTE — Telephone Encounter (Signed)
See below

## 2016-11-11 NOTE — Telephone Encounter (Signed)
Patient would like to discuss the rating that was given by you March 2017, please call.   This is from date of injury 12-20-2014. She states if you would prefer to see her in person for and appointment to discuss this PRD she will schedule this.  She also states the [facility] would pay for the visit.

## 2016-12-02 ENCOUNTER — Ambulatory Visit (INDEPENDENT_AMBULATORY_CARE_PROVIDER_SITE_OTHER): Payer: PRIVATE HEALTH INSURANCE | Admitting: Orthopaedic Surgery

## 2016-12-02 ENCOUNTER — Encounter (INDEPENDENT_AMBULATORY_CARE_PROVIDER_SITE_OTHER): Payer: Self-pay | Admitting: Orthopaedic Surgery

## 2016-12-02 ENCOUNTER — Encounter (INDEPENDENT_AMBULATORY_CARE_PROVIDER_SITE_OTHER): Payer: Self-pay

## 2016-12-02 DIAGNOSIS — S82852S Displaced trimalleolar fracture of left lower leg, sequela: Secondary | ICD-10-CM | POA: Diagnosis not present

## 2016-12-02 NOTE — Progress Notes (Signed)
The patient is a very pleasant 36 year old that I have seen in the past. At the time I first saw her in May 2016 she sustained a work-related accident and suffered a fracture dislocation of her left ankle. She ended up having to have a closed reduction in the ER and then open reduction internal fixation by me in May 2016. I have eventually released her in February 2017. However due to continued pain and swelling she did seek out the services of Dr. Toni Arthurs orthopedic foot and ankle specialist here in town. I was able to review all Dr. Lorenso Quarry notes and he took her to the operating room in December 2017 and removed the implants her ankle performed a ankle arthroscopy. He eventually gave her a disability rating. Apparently the Worker's Compensation care would like for me to see her again to see if I agree with that rating.  Talking with her today she still has pain and swelling in that ankle and now over 4 months out from her surgery is still having some difficulties with it. On examination the ankle she has global swelling but the skin is intact not read. She hurts to palpation posterior to the medial malleolus and at the level of the medial malleolus and anterior to this. She has little bit of pain laterally. She lacks full range of motion by several degrees with plantarflexion and dorsiflexion. Again upon review of all of Dr. Lorenso Quarry records as it pertains to the patient I agree with his disability rating that I believe is about 35%. I do feel that this takes and consideration problems that she is currently having and that she still may have in the future. She will likely seek follow-up with him in the future if she continues to have these problems. I have no further recommendations.

## 2016-12-03 ENCOUNTER — Telehealth (INDEPENDENT_AMBULATORY_CARE_PROVIDER_SITE_OTHER): Payer: Self-pay | Admitting: Orthopaedic Surgery

## 2016-12-03 NOTE — Telephone Encounter (Signed)
Please advise 

## 2016-12-03 NOTE — Telephone Encounter (Signed)
I need the actual form

## 2016-12-03 NOTE — Telephone Encounter (Signed)
Pt case manager called to remind you to send a 25R for WC please.  [phone]  Fax [phone] attn Elnita Maxwell

## 2016-12-03 NOTE — Telephone Encounter (Signed)
Do you know anything about this form that is needed?

## 2016-12-18 ENCOUNTER — Telehealth (INDEPENDENT_AMBULATORY_CARE_PROVIDER_SITE_OTHER): Payer: Self-pay | Admitting: *Deleted

## 2016-12-18 NOTE — Telephone Encounter (Signed)
Can you email this to her please.

## 2016-12-18 NOTE — Telephone Encounter (Signed)
Patient called in this morning in regards to wanting to know if we could possibly e- mail her last office note to her please? Her Email address is [email or web address]. Thank you

## 2017-12-03 DIAGNOSIS — H52223 Regular astigmatism, bilateral: Secondary | ICD-10-CM | POA: Diagnosis not present

## 2017-12-03 DIAGNOSIS — H5201 Hypermetropia, right eye: Secondary | ICD-10-CM | POA: Diagnosis not present

## 2017-12-28 ENCOUNTER — Ambulatory Visit (HOSPITAL_COMMUNITY)
Admission: EM | Admit: 2017-12-28 | Discharge: 2017-12-28 | Disposition: A | Discharge status: Home / Self Care | Payer: 59 | Attending: Family Medicine | Admitting: Family Medicine

## 2017-12-28 ENCOUNTER — Telehealth (HOSPITAL_COMMUNITY): Payer: Self-pay | Admitting: Emergency Medicine

## 2017-12-28 ENCOUNTER — Other Ambulatory Visit: Payer: Self-pay

## 2017-12-28 ENCOUNTER — Encounter (HOSPITAL_COMMUNITY): Payer: Self-pay | Admitting: Family Medicine

## 2017-12-28 DIAGNOSIS — H60501 Unspecified acute noninfective otitis externa, right ear: Secondary | ICD-10-CM | POA: Diagnosis not present

## 2017-12-28 MED ORDER — OXYCODONE HCL 5 MG PO TABS
5.0000 mg | ORAL_TABLET | Freq: Four times a day (QID) | ORAL | 0 refills | Status: DC | PRN
Start: 2017-12-28 — End: 2018-01-23

## 2017-12-28 MED ORDER — CIPROFLOXACIN-HYDROCORTISONE 0.2-1 % OT SUSP: 3.0000 [drp] | Freq: Two times a day (BID) | OTIC | 0 refills | Status: DC

## 2017-12-28 MED ORDER — NEOMYCIN-POLYMYXIN-HC 3.5-10000-1 OT SUSP: 3.0000 [drp] | Freq: Three times a day (TID) | OTIC | 0 refills | Status: DC

## 2017-12-28 MED ORDER — CIPROFLOXACIN HCL 500 MG PO TABS: 500.0000 mg | ORAL_TABLET | Freq: Two times a day (BID) | ORAL | 0 refills | Status: DC

## 2017-12-28 NOTE — ED Triage Notes (Addendum)
Severe ear pain, per pt she gets it once a year, per pt she feels like something is moving around in her ear and it's not muffled it just hurts really bad

## 2017-12-28 NOTE — ED Provider Notes (Signed)
[facility]   [id number] 12/28/17 Arrival Time: 1538   SUBJECTIVE:  Lisa Le is a 37 y.o. female who presents to the urgent care with complaint of Severe ear pain, per pt she gets it once a year, per pt she feels like something is moving around in her ear and it's not muffled it just hurts really bad Patient has had otitis externa twice in the past   Past Medical History:  Diagnosis Date  . Complication of anesthesia    unable to urinate after ORIF ankle, had to insert Foley; had constipation x 1 week  . Dental crown present    tooth #9  . Post-traumatic arthritis of ankle, left 07/2016  . Retained orthopedic hardware 07/2016   left ankle  . Tension headache    Family History  Problem Relation Age of Onset  . Heart disease Mother   . Hypertension Mother    Social History   Socioeconomic History  . Marital status: Single    Spouse name: Not on file  . Number of children: Not on file  . Years of education: Not on file  . Highest education level: Not on file  Occupational History  . Not on file  Social Needs  . Financial resource strain: Not on file  . Food insecurity:    Worry: Not on file    Inability: Not on file  . Transportation needs:    Medical: Not on file    Non-medical: Not on file  Tobacco Use  . Smoking status: Never Smoker  . Smokeless tobacco: Never Used  Substance and Sexual Activity  . Alcohol use: No    Alcohol/week: 0.0 oz  . Drug use: No  . Sexual activity: Not on file  Lifestyle  . Physical activity:    Days per week: Not on file    Minutes per session: Not on file  . Stress: Not on file  Relationships  . Social connections:    Talks on phone: Not on file    Gets together: Not on file    Attends religious service: Not on file    Active member of club or organization: Not on file    Attends meetings of clubs or organizations: Not on file    Relationship status: Not on file  . Intimate partner violence:    Fear of  current or ex partner: Not on file    Emotionally abused: Not on file    Physically abused: Not on file    Forced sexual activity: Not on file  Other Topics Concern  . Not on file  Social History Narrative  . Not on file   No outpatient medications have been marked as taking for the 12/28/17 encounter [facility] Encounter).   Allergies  Allergen Reactions  . Latex Swelling    SWELLING LIPS      ROS: As per HPI, remainder of ROS negative.   OBJECTIVE:   Vitals:   12/28/17 1641  BP: (!) 141/104  Pulse: 72  Resp: 16  Temp: 98.3 F (36.8 C)  TempSrc: Oral  SpO2: 100%     General appearance: alert; no distress Eyes: PERRL; EOMI; conjunctiva normal HENT: normocephalic; atraumatic; TMs normal, canal is swollen on right, external ears normal without trauma; nasal mucosa normal; oral mucosa normal Neck: supple Extremities: no cyanosis or edema; symmetrical with no gross deformities Skin: warm and dry Neurologic: normal gait; grossly normal Psychological: alert and cooperative; normal mood and affect      Labs:  Results for orders placed or performed during the hospital encounter of 07/28/16  Lipase, blood  Result Value Ref Range   Lipase 33 11 - 51 U/L  Comprehensive metabolic panel  Result Value Ref Range   Sodium 136 135 - 145 mmol/L   Potassium 3.9 3.5 - 5.1 mmol/L   Chloride 103 101 - 111 mmol/L   CO2 24 22 - 32 mmol/L   Glucose, Bld 99 65 - 99 mg/dL   BUN 12 6 - 20 mg/dL   Creatinine, Ser 3.24 0.44 - 1.00 mg/dL   Calcium 8.6 (L) 8.9 - 10.3 mg/dL   Total Protein 7.8 6.5 - 8.1 g/dL   Albumin 4.3 3.5 - 5.0 g/dL   AST 18 15 - 41 U/L   ALT 19 14 - 54 U/L   Alkaline Phosphatase 85 38 - 126 U/L   Total Bilirubin 0.8 0.3 - 1.2 mg/dL   GFR calc non Af Amer >60 >60 mL/min   GFR calc Af Amer >60 >60 mL/min   Anion gap 9 5 - 15  CBC  Result Value Ref Range   WBC 8.7 4.0 - 10.5 K/uL   RBC 4.90 3.87 - 5.11 MIL/uL   Hemoglobin 13.4 12.0 - 15.0 g/dL   HCT 40.1  02.7 - 25.3 %   MCV 80.6 78.0 - 100.0 fL   MCH 27.3 26.0 - 34.0 pg   MCHC 33.9 30.0 - 36.0 g/dL   RDW 66.4 40.3 - 47.4 %   Platelets 273 150 - 400 K/uL  I-Stat Beta hCG blood, ED ([facility], [facility], [facility] only)  Result Value Ref Range   I-stat hCG, quantitative <5.0 <5 mIU/mL   Comment 3            Labs Reviewed - No data to display  No results found.     ASSESSMENT & PLAN:  1. Acute otitis externa of right ear, unspecified type     Meds ordered this encounter  Medications  . ciprofloxacin (CIPRO) 500 MG tablet    Sig: Take 1 tablet (500 mg total) by mouth 2 (two) times daily.    Dispense:  10 tablet    Refill:  0  . ciprofloxacin-hydrocortisone (CIPRO HC) OTIC suspension    Sig: Place 3 drops into the right ear 2 (two) times daily.    Dispense:  10 mL    Refill:  0  . oxyCODONE (ROXICODONE) 5 MG immediate release tablet    Sig: Take 1-2 tablets (5-10 mg total) by mouth every 6 (six) hours as needed for moderate pain or severe pain.    Dispense:  10 tablet    Refill:  0    Reviewed expectations re: course of current medical issues. Questions answered. Outlined signs and symptoms indicating need for more acute intervention. Patient verbalized understanding. After Visit Summary given.    Procedures:      Elvina Sidle, MD 12/28/17 1658

## 2018-01-08 ENCOUNTER — Encounter: Payer: Self-pay | Admitting: Physician Assistant

## 2018-01-23 ENCOUNTER — Ambulatory Visit (INDEPENDENT_AMBULATORY_CARE_PROVIDER_SITE_OTHER): Payer: 59 | Admitting: Physician Assistant

## 2018-01-23 ENCOUNTER — Encounter: Payer: Self-pay | Admitting: Physician Assistant

## 2018-01-23 ENCOUNTER — Other Ambulatory Visit: Payer: Self-pay

## 2018-01-23 VITALS — BP 110/81 | HR 90 | Temp 98.5°F | Resp 17 | Ht 68.0 in | Wt 283.6 lb

## 2018-01-23 DIAGNOSIS — Z1329 Encounter for screening for other suspected endocrine disorder: Secondary | ICD-10-CM | POA: Diagnosis not present

## 2018-01-23 DIAGNOSIS — Z130 Encounter for screening for diseases of the blood and blood-forming organs and certain disorders involving the immune mechanism: Secondary | ICD-10-CM | POA: Diagnosis not present

## 2018-01-23 DIAGNOSIS — Z13220 Encounter for screening for lipoid disorders: Secondary | ICD-10-CM | POA: Diagnosis not present

## 2018-01-23 DIAGNOSIS — Z23 Encounter for immunization: Secondary | ICD-10-CM

## 2018-01-23 DIAGNOSIS — Z131 Encounter for screening for diabetes mellitus: Secondary | ICD-10-CM

## 2018-01-23 DIAGNOSIS — Z113 Encounter for screening for infections with a predominantly sexual mode of transmission: Secondary | ICD-10-CM | POA: Diagnosis not present

## 2018-01-23 DIAGNOSIS — Z114 Encounter for screening for human immunodeficiency virus [HIV]: Secondary | ICD-10-CM

## 2018-01-23 DIAGNOSIS — Z13228 Encounter for screening for other metabolic disorders: Secondary | ICD-10-CM | POA: Diagnosis not present

## 2018-01-23 DIAGNOSIS — Z1321 Encounter for screening for nutritional disorder: Secondary | ICD-10-CM

## 2018-01-23 DIAGNOSIS — Z0000 Encounter for general adult medical examination without abnormal findings: Secondary | ICD-10-CM | POA: Diagnosis not present

## 2018-01-23 LAB — URINALYSIS, DIPSTICK ONLY
BILIRUBIN UA: NEGATIVE
Glucose, UA: NEGATIVE
KETONES UA: NEGATIVE
NITRITE UA: NEGATIVE
PROTEIN UA: NEGATIVE
RBC, UA: NEGATIVE
SPEC GRAV UA: 1.024 (ref 1.005–1.030)
Urobilinogen, Ur: 0.2 mg/dL (ref 0.2–1.0)
pH, UA: 5.5 (ref 5.0–7.5)

## 2018-01-23 NOTE — Patient Instructions (Addendum)
   IF you received an x-ray today, you will receive an invoice from [facility]. Please contact  Radiology at [phone] with questions or concerns regarding your invoice.   IF you received labwork today, you will receive an invoice from [facility]. Please contact [facility] at [phone] with questions or concerns regarding your invoice.   Our billing staff will not be able to assist you with questions regarding bills from these companies.  You will be contacted with the lab results as soon as they are available. The fastest way to get your results is to activate your My Chart account. Instructions are located on the last page of this paperwork. If you have not heard from us regarding the results in 2 weeks, please contact this office.     Health Maintenance, Female Adopting a healthy lifestyle and getting preventive care can go a long way to promote health and wellness. Talk with your health care provider about what schedule of regular examinations is right for you. This is a good chance for you to check in with your provider about disease prevention and staying healthy. In between checkups, there are plenty of things you can do on your own. Experts have done a lot of research about which lifestyle changes and preventive measures are most likely to keep you healthy. Ask your health care provider for more information. Weight and diet Eat a healthy diet  Be sure to include plenty of vegetables, fruits, low-fat dairy products, and lean protein.  Do not eat a lot of foods high in solid fats, added sugars, or salt.  Get regular exercise. This is one of the most important things you can do for your health. ? Most adults should exercise for at least 150 minutes each week. The exercise should increase your heart rate and make you sweat (moderate-intensity exercise). ? Most adults should also do strengthening exercises at least twice a week. This is in addition to the  moderate-intensity exercise.  Maintain a healthy weight  Body mass index (BMI) is a measurement that can be used to identify possible weight problems. It estimates body fat based on height and weight. Your health care provider can help determine your BMI and help you achieve or maintain a healthy weight.  For females 20 years of age and older: ? A BMI below 18.5 is considered underweight. ? A BMI of 18.5 to 24.9 is normal. ? A BMI of 25 to 29.9 is considered overweight. ? A BMI of 30 and above is considered obese.  Watch levels of cholesterol and blood lipids  You should start having your blood tested for lipids and cholesterol at 38 years of age, then have this test every 5 years.  You may need to have your cholesterol levels checked more often if: ? Your lipid or cholesterol levels are high. ? You are older than 38 years of age. ? You are at high risk for heart disease.  Cancer screening Lung Cancer  Lung cancer screening is recommended for adults 55-80 years old who are at high risk for lung cancer because of a history of smoking.  A yearly low-dose CT scan of the lungs is recommended for people who: ? Currently smoke. ? Have quit within the past 15 years. ? Have at least a 30-pack-year history of smoking. A pack year is smoking an average of one pack of cigarettes a day for 1 year.  Yearly screening should continue until it has been 15 years since you quit.  Yearly   screening should stop if you develop a health problem that would prevent you from having lung cancer treatment.  Breast Cancer  Practice breast self-awareness. This means understanding how your breasts normally appear and feel.  It also means doing regular breast self-exams. Let your health care provider know about any changes, no matter how small.  If you are in your 20s or 30s, you should have a clinical breast exam (CBE) by a health care provider every 1-3 years as part of a regular health exam.  If you  are 73 or older, have a CBE every year. Also consider having a breast X-ray (mammogram) every year.  If you have a family history of breast cancer, talk to your health care provider about genetic screening.  If you are at high risk for breast cancer, talk to your health care provider about having an MRI and a mammogram every year.  Breast cancer gene (BRCA) assessment is recommended for women who have family members with BRCA-related cancers. BRCA-related cancers include: ? Breast. ? Ovarian. ? Tubal. ? Peritoneal cancers.  Results of the assessment will determine the need for genetic counseling and BRCA1 and BRCA2 testing.  Cervical Cancer Your health care provider may recommend that you be screened regularly for cancer of the pelvic organs (ovaries, uterus, and vagina). This screening involves a pelvic examination, including checking for microscopic changes to the surface of your cervix (Pap test). You may be encouraged to have this screening done every 3 years, beginning at age 61.  For women ages 90-65, health care providers may recommend pelvic exams and Pap testing every 3 years, or they may recommend the Pap and pelvic exam, combined with testing for human papilloma virus (HPV), every 5 years. Some types of HPV increase your risk of cervical cancer. Testing for HPV may also be done on women of any age with unclear Pap test results.  Other health care providers may not recommend any screening for nonpregnant women who are considered low risk for pelvic cancer and who do not have symptoms. Ask your health care provider if a screening pelvic exam is right for you.  If you have had past treatment for cervical cancer or a condition that could lead to cancer, you need Pap tests and screening for cancer for at least 20 years after your treatment. If Pap tests have been discontinued, your risk factors (such as having a new sexual partner) need to be reassessed to determine if screening should  resume. Some women have medical problems that increase the chance of getting cervical cancer. In these cases, your health care provider may recommend more frequent screening and Pap tests.  Colorectal Cancer  This type of cancer can be detected and often prevented.  Routine colorectal cancer screening usually begins at 38 years of age and continues through 38 years of age.  Your health care provider may recommend screening at an earlier age if you have risk factors for colon cancer.  Your health care provider may also recommend using home test kits to check for hidden blood in the stool.  A small camera at the end of a tube can be used to examine your colon directly (sigmoidoscopy or colonoscopy). This is done to check for the earliest forms of colorectal cancer.  Routine screening usually begins at age 67.  Direct examination of the colon should be repeated every 5-10 years through 38 years of age. However, you may need to be screened more often if early forms of precancerous polyps  or small growths are found.  Skin Cancer  Check your skin from head to toe regularly.  Tell your health care provider about any new moles or changes in moles, especially if there is a change in a mole's shape or color.  Also tell your health care provider if you have a mole that is larger than the size of a pencil eraser.  Always use sunscreen. Apply sunscreen liberally and repeatedly throughout the day.  Protect yourself by wearing long sleeves, pants, a wide-brimmed hat, and sunglasses whenever you are outside.  Heart disease, diabetes, and high blood pressure  High blood pressure causes heart disease and increases the risk of stroke. High blood pressure is more likely to develop in: ? People who have blood pressure in the high end of the normal range (130-139/85-89 mm Hg). ? People who are overweight or obese. ? People who are African American.  If you are 18-39 years of age, have your blood  pressure checked every 3-5 years. If you are 40 years of age or older, have your blood pressure checked every year. You should have your blood pressure measured twice-once when you are at a hospital or clinic, and once when you are not at a hospital or clinic. Record the average of the two measurements. To check your blood pressure when you are not at a hospital or clinic, you can use: ? An automated blood pressure machine at a pharmacy. ? A home blood pressure monitor.  If you are between 55 years and 79 years old, ask your health care provider if you should take aspirin to prevent strokes.  Have regular diabetes screenings. This involves taking a blood sample to check your fasting blood sugar level. ? If you are at a normal weight and have a low risk for diabetes, have this test once every three years after 38 years of age. ? If you are overweight and have a high risk for diabetes, consider being tested at a younger age or more often. Preventing infection Hepatitis B  If you have a higher risk for hepatitis B, you should be screened for this virus. You are considered at high risk for hepatitis B if: ? You were born in a country where hepatitis B is common. Ask your health care provider which countries are considered high risk. ? Your parents were born in a high-risk country, and you have not been immunized against hepatitis B (hepatitis B vaccine). ? You have HIV or AIDS. ? You use needles to inject street drugs. ? You live with someone who has hepatitis B. ? You have had sex with someone who has hepatitis B. ? You get hemodialysis treatment. ? You take certain medicines for conditions, including cancer, organ transplantation, and autoimmune conditions.  Hepatitis C  Blood testing is recommended for: ? Everyone born from 1945 through 1965. ? Anyone with known risk factors for hepatitis C.  Sexually transmitted infections (STIs)  You should be screened for sexually transmitted  infections (STIs) including gonorrhea and chlamydia if: ? You are sexually active and are younger than 38 years of age. ? You are older than 38 years of age and your health care provider tells you that you are at risk for this type of infection. ? Your sexual activity has changed since you were last screened and you are at an increased risk for chlamydia or gonorrhea. Ask your health care provider if you are at risk.  If you do not have HIV, but are at risk,   it may be recommended that you take a prescription medicine daily to prevent HIV infection. This is called pre-exposure prophylaxis (PrEP). You are considered at risk if: ? You are sexually active and do not regularly use condoms or know the HIV status of your partner(s). ? You take drugs by injection. ? You are sexually active with a partner who has HIV.  Talk with your health care provider about whether you are at high risk of being infected with HIV. If you choose to begin PrEP, you should first be tested for HIV. You should then be tested every 3 months for as long as you are taking PrEP. Pregnancy  If you are premenopausal and you may become pregnant, ask your health care provider about preconception counseling.  If you may become pregnant, take 400 to 800 micrograms (mcg) of folic acid every day.  If you want to prevent pregnancy, talk to your health care provider about birth control (contraception). Osteoporosis and menopause  Osteoporosis is a disease in which the bones lose minerals and strength with aging. This can result in serious bone fractures. Your risk for osteoporosis can be identified using a bone density scan.  If you are 22 years of age or older, or if you are at risk for osteoporosis and fractures, ask your health care provider if you should be screened.  Ask your health care provider whether you should take a calcium or vitamin D supplement to lower your risk for osteoporosis.  Menopause may have certain physical  symptoms and risks.  Hormone replacement therapy may reduce some of these symptoms and risks. Talk to your health care provider about whether hormone replacement therapy is right for you. Follow these instructions at home:  Schedule regular health, dental, and eye exams.  Stay current with your immunizations.  Do not use any tobacco products including cigarettes, chewing tobacco, or electronic cigarettes.  If you are pregnant, do not drink alcohol.  If you are breastfeeding, limit how much and how often you drink alcohol.  Limit alcohol intake to no more than 1 drink per day for nonpregnant women. One drink equals 12 ounces of beer, 5 ounces of wine, or 1 ounces of hard liquor.  Do not use street drugs.  Do not share needles.  Ask your health care provider for help if you need support or information about quitting drugs.  Tell your health care provider if you often feel depressed.  Tell your health care provider if you have ever been abused or do not feel safe at home. This information is not intended to replace advice given to you by your health care provider. Make sure you discuss any questions you have with your health care provider. Document Released: 02/04/2011 Document Revised: 12/28/2015 Document Reviewed: 04/25/2015 Elsevier Interactive Patient Education  2018 [employer].     Why follow it? Research shows. . Those who follow the Mediterranean diet have a reduced risk of heart disease  . The diet is associated with a reduced incidence of Parkinson's and Alzheimer's diseases . People following the diet may have longer life expectancies and lower rates of chronic diseases  . The Dietary Guidelines for Americans recommends the Mediterranean diet as an eating plan to promote health and prevent disease  What Is the Mediterranean Diet?  . Healthy eating plan based on typical foods and recipes of Mediterranean-style cooking . The diet is primarily a plant based diet; these  foods should make up a majority of meals   Starches -  Plant based foods should make up a majority of meals - They are an important sources of vitamins, minerals, energy, antioxidants, and fiber - Choose whole grains, foods high in fiber and minimally processed items  - Typical grain sources include wheat, oats, barley, corn, brown rice, bulgar, farro, millet, polenta, couscous  - Various types of beans include chickpeas, lentils, fava beans, black beans, white beans   Fruits  Veggies - Large quantities of antioxidant rich fruits & veggies; 6 or more servings  - Vegetables can be eaten raw or lightly drizzled with oil and cooked  - Vegetables common to the traditional Mediterranean Diet include: artichokes, arugula, beets, broccoli, brussel sprouts, cabbage, carrots, celery, collard greens, cucumbers, eggplant, kale, leeks, lemons, lettuce, mushrooms, okra, onions, peas, peppers, potatoes, pumpkin, radishes, rutabaga, shallots, spinach, sweet potatoes, turnips, zucchini - Fruits common to the Mediterranean Diet include: apples, apricots, avocados, cherries, clementines, dates, figs, grapefruits, grapes, melons, nectarines, oranges, peaches, pears, pomegranates, strawberries, tangerines  Fats - Replace butter and margarine with healthy oils, such as olive oil, canola oil, and tahini  - Limit nuts to no more than a handful a day  - Nuts include walnuts, almonds, pecans, pistachios, pine nuts  - Limit or avoid candied, honey roasted or heavily salted nuts - Olives are central to the [employer] - can be eaten whole or used in a variety of dishes   Meats Protein - Limiting red meat: no more than a few times a month - When eating red meat: choose lean cuts and keep the portion to the size of deck of cards - Eggs: approx. 0 to 4 times a week  - Fish and lean poultry: at least 2 a week  - Healthy protein sources include, chicken, [place], lean beef, lamb - Increase intake of seafood such as tuna,  salmon, trout, mackerel, shrimp, scallops - Avoid or limit high fat processed meats such as sausage and bacon  Dairy - Include moderate amounts of low fat dairy products  - Focus on healthy dairy such as fat free yogurt, skim milk, low or reduced fat cheese - Limit dairy products higher in fat such as whole or 2% milk, cheese, ice cream  Alcohol - Moderate amounts of red wine is ok  - No more than 5 oz daily for women (all ages) and men older than age 34  - No more than 10 oz of wine daily for men younger than 43  Other - Limit sweets and other desserts  - Use herbs and spices instead of salt to flavor foods  - Herbs and spices common to the traditional Mediterranean Diet include: basil, bay leaves, chives, cloves, cumin, fennel, garlic, lavender, marjoram, mint, oregano, parsley, pepper, rosemary, sage, savory, sumac, tarragon, thyme   It's not just a diet, it's a lifestyle:  . The Mediterranean diet includes lifestyle factors typical of those in the region  . Foods, drinks and meals are best eaten with others and savored . Daily physical activity is important for overall good health . This could be strenuous exercise like running and aerobics . This could also be more leisurely activities such as walking, housework, yard-work, or taking the stairs . Moderation is the key; a balanced and healthy diet accommodates most foods and drinks . Consider portion sizes and frequency of consumption of certain foods   Meal Ideas & Options:  . Breakfast:  o Whole wheat toast or whole wheat English muffins with peanut butter & hard boiled egg o Steel cut  oats topped with apples & cinnamon and skim milk  o Fresh fruit: banana, strawberries, melon, berries, peaches  o Smoothies: strawberries, bananas, greek yogurt, peanut butter o Low fat greek yogurt with blueberries and granola  o Egg white omelet with spinach and mushrooms o Breakfast couscous: whole wheat couscous, apricots, skim milk, cranberries   . Sandwiches:  o Hummus and grilled vegetables (peppers, zucchini, squash) on whole wheat bread   o Grilled chicken on whole wheat pita with lettuce, tomatoes, cucumbers or tzatziki  o Tuna salad on whole wheat bread: tuna salad made with greek yogurt, olives, red peppers, capers, green onions o Garlic rosemary lamb pita: lamb sauted with garlic, rosemary, salt & pepper; add lettuce, cucumber, greek yogurt to pita - flavor with lemon juice and black pepper  . Seafood:  o Mediterranean grilled salmon, seasoned with garlic, basil, parsley, lemon juice and black pepper o Shrimp, lemon, and spinach whole-grain pasta salad made with low fat greek yogurt  o Seared scallops with lemon orzo  o Seared tuna steaks seasoned salt, pepper, coriander topped with tomato mixture of olives, tomatoes, olive oil, minced garlic, parsley, green onions and cappers  . Meats:  o Herbed greek chicken salad with kalamata olives, cucumber, feta  o Red bell peppers stuffed with spinach, bulgur, lean ground beef (or lentils) & topped with feta   o Kebabs: skewers of chicken, tomatoes, onions, zucchini, squash  o [place] burgers: made with red onions, mint, dill, lemon juice, feta cheese topped with roasted red peppers . Vegetarian o Cucumber salad: cucumbers, artichoke hearts, celery, red onion, feta cheese, tossed in olive oil & lemon juice  o Hummus and whole grain pita points with a greek salad (lettuce, tomato, feta, olives, cucumbers, red onion) o Lentil soup with celery, carrots made with vegetable broth, garlic, salt and pepper  o Tabouli salad: parsley, bulgur, mint, scallions, cucumbers, tomato, radishes, lemon juice, olive oil, salt and pepper.

## 2018-01-23 NOTE — Progress Notes (Signed)
01/23/2018 9:33 AM   DOB: 02/26/1981 / MRN: [id number]  SUBJECTIVE:  Lisa GrahamKarima Le is a 37 y.o. female presenting for an annual physical and has no complaints today.  She tells me she will get a Pap smear from her GYN doctor.  She does receive Paps from a GYN and her most recent was 2 years ago and completely normal.  She [place]. [place] in [place] for this.  Health Maintenance Due  Topic  . HIV Screening   . TETANUS/TDAP     She is allergic to latex.   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    She  reports that she has never smoked. She has never used smokeless tobacco. She reports that she does not drink alcohol or use drugs. She  has no sexual activity history on file. The patient  has a past surgical history that includes ORIF ankle fracture (Left, 12/27/2014); Hardware Removal (Left, 07/25/2016); and Ankle arthroscopy (Left, 07/25/2016).  Her family history includes Heart disease in her mother; Hypertension in her mother.  Review of Systems  Constitutional: Negative for chills, diaphoresis and fever.  Eyes: Negative.   Respiratory: Negative for cough, hemoptysis, sputum production, shortness of breath and wheezing.   Cardiovascular: Negative for chest pain, orthopnea and leg swelling.  Gastrointestinal: Negative for abdominal pain, blood in stool, constipation, diarrhea, heartburn, melena, nausea and vomiting.  Genitourinary: Negative for dysuria, flank pain, frequency, hematuria and urgency.  Skin: Negative for rash.  Neurological: Negative for dizziness, sensory change, speech change, focal weakness and headaches.    Problem list and medications reviewed and updated by myself where necessary, and exist elsewhere in the encounter.   OBJECTIVE:  BP 110/81 (BP Location: Right Arm, Patient Position: Sitting,  Cuff Size: Large)   Pulse 90   Temp 98.5 F (36.9 C) (Oral)   Resp 17   Ht 5\' 8"  (1.727 m)   Wt 283 lb 9.6 oz (128.6 kg)   LMP 01/04/2018   SpO2 96%   BMI 43.12 kg/m   Physical Exam  Constitutional: She is oriented to person, place, and time. She appears well-nourished. No distress.  HENT:  Right Ear: External ear normal.  Left Ear: External ear normal.  Nose: Mucosal edema present. Right sinus exhibits no maxillary sinus tenderness and no frontal sinus tenderness. Left sinus exhibits no maxillary sinus tenderness and no frontal sinus tenderness.  Mouth/Throat: Oropharynx is clear and moist. No oropharyngeal exudate.  Eyes: Pupils are equal, round, and reactive to light. Conjunctivae and EOM are normal.  Cardiovascular: Normal rate, regular rhythm, S1 normal, S2 normal, normal heart sounds and intact distal pulses. Exam reveals no gallop, no friction rub and no decreased pulses.  No murmur heard. Pulmonary/Chest: Effort normal and breath sounds normal. No stridor. No respiratory distress. She has no wheezes. She has no rales.  Abdominal: She exhibits no distension.  Musculoskeletal: She exhibits no edema.  Neurological: She is alert and oriented to person, place, and time. No cranial nerve deficit. Gait normal.  Skin: Skin is warm and dry. No rash noted. She is not diaphoretic. No erythema.  Psychiatric: She has a normal mood and affect. Her behavior is normal.  Vitals reviewed.   Lab Results  Component Value Date   WBC 8.7 07/28/2016   HGB 13.4 07/28/2016   HCT 39.5 07/28/2016   MCV 80.6 07/28/2016   PLT  273 07/28/2016    Lab Results  Component Value Date   NA 136 07/28/2016   K 3.9 07/28/2016   CL 103 07/28/2016   CO2 24 07/28/2016    Lab Results  Component Value Date   CREATININE 0.67 07/28/2016    Lab Results  Component Value Date   ALT 19 07/28/2016   AST 18 07/28/2016   ALKPHOS 85 07/28/2016   BILITOT 0.8 07/28/2016     ASSESSMENT AND PLAN  Lisa Le  was seen today for annual exam.  Diagnoses and all orders for this visit:  Annual physical exam  Screening for endocrine, nutritional, metabolic and immunity disorder -     Basic metabolic panel -     CBC -     Urinalysis, dipstick only -     Vitamin D, 25-hydroxy  Screening for diabetes mellitus -     Hemoglobin A1c  Screening, lipid -     Lipid panel  Screening for thyroid disorder -     TSH  Screening for HIV (human immunodeficiency virus) -     HIV antibody  Routine screening for STI (sexually transmitted infection) -     GC/Chlamydia Probe Amp  Need for diphtheria-tetanus-pertussis (Tdap) vaccine -     Tdap vaccine greater than or equal to 7yo IM    The patient was advised to call or return to clinic if she does not see an improvement in symptoms or to seek the care of the closest emergency department if she worsens with the above plan.   Deliah Boston, MHS, PA-C Primary [facility] at [facility] 01/23/2018 9:33 AM

## 2018-01-24 LAB — TSH: TSH: 3.8 u[IU]/mL (ref 0.450–4.500)

## 2018-01-24 LAB — CBC
HEMOGLOBIN: 13 g/dL (ref 11.1–15.9)
Hematocrit: 38.6 % (ref 34.0–46.6)
MCH: 27.3 pg (ref 26.6–33.0)
MCHC: 33.7 g/dL (ref 31.5–35.7)
MCV: 81 fL (ref 79–97)
PLATELETS: 250 10*3/uL (ref 150–450)
RBC: 4.76 x10E6/uL (ref 3.77–5.28)
RDW: 15.2 % (ref 12.3–15.4)
WBC: 5.3 10*3/uL (ref 3.4–10.8)

## 2018-01-24 LAB — BASIC METABOLIC PANEL
BUN/Creatinine Ratio: 12 (ref 9–23)
BUN: 8 mg/dL (ref 6–20)
CHLORIDE: 105 mmol/L (ref 96–106)
CO2: 20 mmol/L (ref 20–29)
Calcium: 9 mg/dL (ref 8.7–10.2)
Creatinine, Ser: 0.69 mg/dL (ref 0.57–1.00)
GFR calc Af Amer: 128 mL/min/{1.73_m2} (ref >59)
GFR, EST NON AFRICAN AMERICAN: 111 mL/min/{1.73_m2} (ref >59)
Glucose: 88 mg/dL (ref 65–99)
POTASSIUM: 4.2 mmol/L (ref 3.5–5.2)
SODIUM: 141 mmol/L (ref 134–144)

## 2018-01-24 LAB — LIPID PANEL
CHOL/HDL RATIO: 4.4 ratio (ref 0.0–4.4)
Cholesterol, Total: 189 mg/dL (ref 100–199)
HDL: 43 mg/dL (ref >39)
LDL Calculated: 123 mg/dL — ABNORMAL HIGH (ref 0–99)
TRIGLYCERIDES: 114 mg/dL (ref 0–149)
VLDL Cholesterol Cal: 23 mg/dL (ref 5–40)

## 2018-01-24 LAB — VITAMIN D 25 HYDROXY (VIT D DEFICIENCY, FRACTURES): VIT D 25 HYDROXY: 9.8 ng/mL — AB (ref 30.0–100.0)

## 2018-01-24 LAB — HEMOGLOBIN A1C
ESTIMATED AVERAGE GLUCOSE: 100 mg/dL
HEMOGLOBIN A1C: 5.1 % (ref 4.8–5.6)

## 2018-01-24 LAB — HIV ANTIBODY (ROUTINE TESTING W REFLEX): HIV SCREEN 4TH GENERATION: NONREACTIVE

## 2018-01-25 LAB — GC/CHLAMYDIA PROBE AMP
CHLAMYDIA, DNA PROBE: NEGATIVE
Neisseria gonorrhoeae by PCR: NEGATIVE

## 2018-04-20 ENCOUNTER — Ambulatory Visit: Payer: Self-pay | Admitting: *Deleted

## 2018-04-20 NOTE — Telephone Encounter (Signed)
I returned her call.   She is c/o middle back pain and vomiting.   She has had this happen occasionally before but after she vomited the pain would usually resolve but this time it has not.   It seems to be occurring more frequently.  The protocol is for her to be seen within 4 hours however all the providers are full at [facility] and she does not want to go to the ED if possible due to the expense.   She requested to see someone tomorrow morning if possible.    I was able to schedule her with Dr. Alvy BimlerSagardia at 9:20 on 04/21/18.    I instructed her to go to the ED/Urgent Care if her symptoms became worse or the pain became worse.   She verbalized understanding of these instructions.  Reason for Disposition . [1] SEVERE back pain (e.g., excruciating, unable to do any normal activities) AND [2] not improved 2 hours after pain medicine  Answer Assessment - Initial Assessment Questions 1. ONSET: "When did the pain begin?"      I'm having pain in my middle back and I'm vomiting.   I've had this happen before.  It happens every so often last week it happened.   It hurts to breath. 2. LOCATION: "Where does it hurt?" (upper, mid or lower back)     Middle back.   3. SEVERITY: "How bad is the pain?"  (e.g., Scale 1-10; mild, moderate, or severe)   - MILD (1-3): doesn't interfere with normal activities    - MODERATE (4-7): interferes with normal activities or awakens from sleep    - SEVERE (8-10): excruciating pain, unable to do any normal activities      8 pain scale. 4. PATTERN: "Is the pain constant?" (e.g., yes, no; constant, intermittent)      Constant.   It began Saturday.   I felt sick on stomach.   Took Pepto Bismal and went to sleep.   Then a sharp pain hit me during the night and woke me up.   About 30 minutes later I vomit.   It's happening more frequently.  5. RADIATION: "Does the pain shoot into your legs or elsewhere?"     No.   Right before I vomit I have upper front abd pain. 6. CAUSE:  "What  do you think is causing the back pain?"      I think it's my gallbladder. 7. BACK OVERUSE:  "Any recent lifting of heavy objects, strenuous work or exercise?"     No 8. MEDICATIONS: "What have you taken so far for the pain?" (e.g., nothing, acetaminophen, NSAIDS)     I took Ibuprofen on Saturday.    5:30am this morning I took so Nyquil and slept a little. 9. NEUROLOGIC SYMPTOMS: "Do you have any weakness, numbness, or problems with bowel/bladder control?"     No 10. OTHER SYMPTOMS: "Do you have any other symptoms?" (e.g., fever, abdominal pain, burning with urination, blood in urine)       No 11. PREGNANCY: "Is there any chance you are pregnant?" (e.g., yes, no; LMP)       No.  Aug. 26th last period.  Protocols used: BACK PAIN-A-AH

## 2018-04-21 ENCOUNTER — Ambulatory Visit (INDEPENDENT_AMBULATORY_CARE_PROVIDER_SITE_OTHER): Payer: 59 | Admitting: Emergency Medicine

## 2018-04-21 ENCOUNTER — Other Ambulatory Visit: Payer: Self-pay

## 2018-04-21 ENCOUNTER — Encounter: Payer: Self-pay | Admitting: Emergency Medicine

## 2018-04-21 VITALS — BP 104/73 | HR 75 | Temp 99.1°F | Resp 16 | Ht 67.5 in | Wt 285.4 lb

## 2018-04-21 DIAGNOSIS — R112 Nausea with vomiting, unspecified: Secondary | ICD-10-CM | POA: Diagnosis not present

## 2018-04-21 DIAGNOSIS — M549 Dorsalgia, unspecified: Secondary | ICD-10-CM | POA: Diagnosis not present

## 2018-04-21 DIAGNOSIS — R1013 Epigastric pain: Secondary | ICD-10-CM

## 2018-04-21 MED ORDER — ONDANSETRON HCL 4 MG PO TABS: 4.0000 mg | ORAL_TABLET | Freq: Three times a day (TID) | ORAL | 0 refills | Status: DC | PRN

## 2018-04-21 NOTE — Assessment & Plan Note (Signed)
Unclear etiology.  GI source most likely.  At times triggered by back pain as well.  No red flag signs or symptoms.  Clinically stable.  Vital signs stable.  Benign exam.  Will rule out gallbladder possibility.

## 2018-04-21 NOTE — Progress Notes (Signed)
Lisa Le 37 y.o.   Chief Complaint  Patient presents with  . Emesis    per patient starts with pain mid to upper back, it has happened before 07/2016     HISTORY OF PRESENT ILLNESS: This is a 37 y.o. female complaining of nausea and vomiting triggered by mid to upper back pain and at times epigastric pain.  Been going on for the past 1 to 2 weeks.  Vomited last 2 days ago.  Denies any diarrhea or abdominal pain.  Denies rectal bleeding or melena.  No medications.  No chronic medical problems.  Not pregnant.  Denies any other significant symptomatology.  HPI   Prior to Admission medications   Medication Sig Start Date End Date Taking? Authorizing Provider  Bismuth Subsalicylate (PEPTO-BISMOL PO) Take by mouth as needed.   Yes [provider]  IBUPROFEN PO Take by mouth as needed.   Yes [provider]    Allergies  Allergen Reactions  . Latex Swelling    SWELLING LIPS    Patient Active Problem List   Diagnosis Date Noted  . Trimalleolar fracture of left ankle 12/27/2014  . Trimalleolar fracture of ankle, closed 12/27/2014    Past Medical History:  Diagnosis Date  . Complication of anesthesia    unable to urinate after ORIF ankle, had to insert Foley; had constipation x 1 week  . Dental crown present    tooth #9  . Post-traumatic arthritis of ankle, left 07/2016  . Retained orthopedic hardware 07/2016   left ankle  . Tension headache     Past Surgical History:  Procedure Laterality Date  . ANKLE ARTHROSCOPY Left 07/25/2016   Procedure: LEFT ANKLE ARTHROSCOPY WITH EXTENSIVE DEBRIDEMENT;  Surgeon: Toni Arthurs, MD;  Location: [facility];  Service: Orthopedics;  Laterality: Left;  . HARDWARE REMOVAL Left 07/25/2016   Procedure: LEFT ANKLE REMOVAL OF DEEP IMPLANTS;  Surgeon: Toni Arthurs, MD;  Location: [facility];  Service: Orthopedics;  Laterality: Left;  . ORIF ANKLE FRACTURE Left 12/27/2014   Procedure: OPEN  REDUCTION INTERNAL FIXATION (ORIF) LEFT ANKLE FRACTURE;  Surgeon: Kathryne Hitch, MD;  Location: [facility];  Service: Orthopedics;  Laterality: Left;    Social History   Socioeconomic History  . Marital status: Single    Spouse name: Not on file  . Number of children: Not on file  . Years of education: Not on file  . Highest education level: Not on file  Occupational History  . Not on file  Social Needs  . Financial resource strain: Not on file  . Food insecurity:    Worry: Not on file    Inability: Not on file  . Transportation needs:    Medical: Not on file    Non-medical: Not on file  Tobacco Use  . Smoking status: Never Smoker  . Smokeless tobacco: Never Used  Substance and Sexual Activity  . Alcohol use: No    Alcohol/week: 0.0 standard drinks  . Drug use: No  . Sexual activity: Not on file  Lifestyle  . Physical activity:    Days per week: Not on file    Minutes per session: Not on file  . Stress: Not on file  Relationships  . Social connections:    Talks on phone: Not on file    Gets together: Not on file    Attends religious service: Not on file    Active member of club or organization: Not on file    Attends  meetings of clubs or organizations: Not on file    Relationship status: Not on file  . Intimate partner violence:    Fear of current or ex partner: Not on file    Emotionally abused: Not on file    Physically abused: Not on file    Forced sexual activity: Not on file  Other Topics Concern  . Not on file  Social History Narrative  . Not on file    Family History  Problem Relation Age of Onset  . Heart disease Mother   . Hypertension Mother      Review of Systems  Constitutional: Negative.  Negative for chills and fever.  HENT: Negative.  Negative for sore throat.   Eyes: Negative.  Negative for blurred vision and double vision.  Respiratory: Negative.  Negative for cough and shortness of breath.   Cardiovascular: Negative.  Negative for  chest pain and palpitations.  Gastrointestinal: Positive for nausea and vomiting.  Genitourinary: Negative.  Negative for dysuria and hematuria.  Musculoskeletal: Positive for back pain.  Skin: Negative.  Negative for rash.  Neurological: Negative.  Negative for dizziness and headaches.  Endo/Heme/Allergies: Negative.   All other systems reviewed and are negative.     Vitals:   04/21/18 0916  BP: 104/73  Pulse: 75  Resp: 16  Temp: 99.1 F (37.3 C)  SpO2: 96%    Physical Exam  Constitutional: She is oriented to person, place, and time. She appears well-developed and well-nourished.  HENT:  Head: Normocephalic and atraumatic.  Nose: Nose normal.  Mouth/Throat: Oropharynx is clear and moist.  Eyes: Pupils are equal, round, and reactive to light. Conjunctivae and EOM are normal.  Neck: Normal range of motion. Neck supple.  Cardiovascular: Normal rate, regular rhythm and normal heart sounds.  Pulmonary/Chest: Effort normal and breath sounds normal.  Abdominal: Soft. Bowel sounds are normal.  Musculoskeletal: Normal range of motion. She exhibits no edema or tenderness.  Neurological: She is alert and oriented to person, place, and time. No sensory deficit. She exhibits normal muscle tone.  Skin: Skin is warm and dry. Capillary refill takes less than 2 seconds.  Psychiatric: She has a normal mood and affect. Her behavior is normal.  Vitals reviewed.  Nausea and vomiting Unclear etiology.  GI source most likely.  At times triggered by back pain as well.  No red flag signs or symptoms.  Clinically stable.  Vital signs stable.  Benign exam.  Will rule out gallbladder possibility.    ASSESSMENT & PLAN: Madigan was seen today for emesis.  Diagnoses and all orders for this visit:  Nausea and vomiting, intractability of vomiting not specified, unspecified vomiting type -     ondansetron (ZOFRAN) 4 MG tablet; Take 1 tablet (4 mg total) by mouth every 8 (eight) hours as needed for  nausea or vomiting. -     CBC with Differential/Platelet -     Comprehensive metabolic panel -     Lipase -     US Abdomen Limited RUQ; Future  Mid back pain -     Lipase  Epigastric pain -     CBC with Differential/Platelet -     Comprehensive metabolic panel -     Lipase -     US Abdomen Limited RUQ; Future    Patient Instructions       If you have lab work done today you will be contacted with your lab results within the next 2 weeks.  If you have not heard  from us then please contact us. The fastest way to get your results is to register for My Chart.   IF you received an x-ray today, you will receive an invoice from [facility]. Please contact [facility] at [phone] with questions or concerns regarding your invoice.   IF you received labwork today, you will receive an invoice from [facility]. Please contact [facility] at [phone] with questions or concerns regarding your invoice.   Our billing staff will not be able to assist you with questions regarding bills from these companies.  You will be contacted with the lab results as soon as they are available. The fastest way to get your results is to activate your My Chart account. Instructions are located on the last page of this paperwork. If you have not heard from us regarding the results in 2 weeks, please contact this office.     Nausea and Vomiting, Adult Feeling sick to your stomach (nausea) means that your stomach is upset or you feel like you have to throw up (vomit). Feeling more and more sick to your stomach can lead to throwing up. Throwing up happens when food and liquid from your stomach are thrown up and out the mouth. Throwing up can make you feel weak and cause you to get dehydrated. Dehydration can make you tired and thirsty, make you have a dry mouth, and make it so you pee (urinate) less often. Older adults and people with other diseases or a weak defense system (immune system) are at  higher risk for dehydration. If you feel sick to your stomach or if you throw up, it is important to follow instructions from your doctor about how to take care of yourself. Follow these instructions at home: Eating and drinking Follow these instructions as told by your doctor:  Take an oral rehydration solution (ORS). This is a drink that is sold at pharmacies and stores.  Drink clear fluids in small amounts as you are able, such as: ? Water. ? Ice chips. ? Diluted fruit juice. ? Low-calorie sports drinks.  Eat bland, easy-to-digest foods in small amounts as you are able, such as: ? Bananas. ? Applesauce. ? Rice. ? Low-fat (lean) meats. ? Toast. ? Crackers.  Avoid fluids that have a lot of sugar or caffeine in them.  Avoid alcohol.  Avoid spicy or fatty foods.  General instructions  Drink enough fluid to keep your pee (urine) clear or pale yellow.  Wash your hands often. If you cannot use soap and water, use hand sanitizer.  Make sure that all people in your home wash their hands well and often.  Take over-the-counter and prescription medicines only as told by your doctor.  Rest at home while you get better.  Watch your condition for any changes.  Breathe slowly and deeply when you feel sick to your stomach.  Keep all follow-up visits as told by your doctor. This is important. Contact a doctor if:  You have a fever.  You cannot keep fluids down.  Your symptoms get worse.  You have new symptoms.  You feel sick to your stomach for more than two days.  You feel light-headed or dizzy.  You have a headache.  You have muscle cramps. Get help right away if:  You have pain in your chest, neck, arm, or jaw.  You feel very weak or you pass out (faint).  You throw up again and again.  You see blood in your throw-up.  Your throw-up looks like black  coffee grounds.  You have bloody or black poop (stools) or poop that look like tar.  You have a very bad  headache, a stiff neck, or both.  You have a rash.  You have very bad pain, cramping, or bloating in your belly (abdomen).  You have trouble breathing.  You are breathing very quickly.  Your heart is beating very quickly.  Your skin feels cold and clammy.  You feel confused.  You have pain when you pee.  You have signs of dehydration, such as: ? Dark pee, hardly any pee, or no pee. ? Cracked lips. ? Dry mouth. ? Sunken eyes. ? Sleepiness. ? Weakness. These symptoms may be an emergency. Do not wait to see if the symptoms will go away. Get medical help right away. Call your local emergency services (911 in the [place] Do not drive yourself to the hospital. This information is not intended to replace advice given to you by your health care provider. Make sure you discuss any questions you have with your health care provider. Document Released: 01/08/2008 Document Revised: 02/09/2016 Document Reviewed: 03/28/2015 Elsevier Interactive Patient Education  2018 Elsevier Inc.       Edwina Barth, MD [facility]

## 2018-04-21 NOTE — Patient Instructions (Addendum)
If you have lab work done today you will be contacted with your lab results within the next 2 weeks.  If you have not heard from us then please contact us. The fastest way to get your results is to register for My Chart.   IF you received an x-ray today, you will receive an invoice from [facility]. Please contact [facility] at [phone] with questions or concerns regarding your invoice.   IF you received labwork today, you will receive an invoice from [facility]. Please contact [facility] at [phone] with questions or concerns regarding your invoice.   Our billing staff will not be able to assist you with questions regarding bills from these companies.  You will be contacted with the lab results as soon as they are available. The fastest way to get your results is to activate your My Chart account. Instructions are located on the last page of this paperwork. If you have not heard from us regarding the results in 2 weeks, please contact this office.     Nausea and Vomiting, Adult Feeling sick to your stomach (nausea) means that your stomach is upset or you feel like you have to throw up (vomit). Feeling more and more sick to your stomach can lead to throwing up. Throwing up happens when food and liquid from your stomach are thrown up and out the mouth. Throwing up can make you feel weak and cause you to get dehydrated. Dehydration can make you tired and thirsty, make you have a dry mouth, and make it so you pee (urinate) less often. Older adults and people with other diseases or a weak defense system (immune system) are at higher risk for dehydration. If you feel sick to your stomach or if you throw up, it is important to follow instructions from your doctor about how to take care of yourself. Follow these instructions at home: Eating and drinking Follow these instructions as told by your doctor:  Take an oral rehydration solution (ORS). This is a drink that is sold  at pharmacies and stores.  Drink clear fluids in small amounts as you are able, such as: ? Water. ? Ice chips. ? Diluted fruit juice. ? Low-calorie sports drinks.  Eat bland, easy-to-digest foods in small amounts as you are able, such as: ? Bananas. ? Applesauce. ? Rice. ? Low-fat (lean) meats. ? Toast. ? Crackers.  Avoid fluids that have a lot of sugar or caffeine in them.  Avoid alcohol.  Avoid spicy or fatty foods.  General instructions  Drink enough fluid to keep your pee (urine) clear or pale yellow.  Wash your hands often. If you cannot use soap and water, use hand sanitizer.  Make sure that all people in your home wash their hands well and often.  Take over-the-counter and prescription medicines only as told by your doctor.  Rest at home while you get better.  Watch your condition for any changes.  Breathe slowly and deeply when you feel sick to your stomach.  Keep all follow-up visits as told by your doctor. This is important. Contact a doctor if:  You have a fever.  You cannot keep fluids down.  Your symptoms get worse.  You have new symptoms.  You feel sick to your stomach for more than two days.  You feel light-headed or dizzy.  You have a headache.  You have muscle cramps. Get help right away if:  You have pain in your chest, neck, arm, or jaw.  You feel very weak or you pass out (faint).  You throw up again and again.  You see blood in your throw-up.  Your throw-up looks like black coffee grounds.  You have bloody or black poop (stools) or poop that look like tar.  You have a very bad headache, a stiff neck, or both.  You have a rash.  You have very bad pain, cramping, or bloating in your belly (abdomen).  You have trouble breathing.  You are breathing very quickly.  Your heart is beating very quickly.  Your skin feels cold and clammy.  You feel confused.  You have pain when you pee.  You have signs of dehydration,  such as: ? Dark pee, hardly any pee, or no pee. ? Cracked lips. ? Dry mouth. ? Sunken eyes. ? Sleepiness. ? Weakness. These symptoms may be an emergency. Do not wait to see if the symptoms will go away. Get medical help right away. Call your local emergency services (911 in the [place] Do not drive yourself to the hospital. This information is not intended to replace advice given to you by your health care provider. Make sure you discuss any questions you have with your health care provider. Document Released: 01/08/2008 Document Revised: 02/09/2016 Document Reviewed: 03/28/2015 Elsevier Interactive Patient Education  2018 [employer].

## 2018-04-22 LAB — CBC WITH DIFFERENTIAL/PLATELET
BASOS ABS: 0 10*3/uL (ref 0.0–0.2)
Basos: 0 %
EOS (ABSOLUTE): 0.1 10*3/uL (ref 0.0–0.4)
Eos: 2 %
HEMOGLOBIN: 12.6 g/dL (ref 11.1–15.9)
Hematocrit: 40.5 % (ref 34.0–46.6)
IMMATURE GRANS (ABS): 0 10*3/uL (ref 0.0–0.1)
IMMATURE GRANULOCYTES: 0 %
LYMPHS: 36 %
Lymphocytes Absolute: 1.9 10*3/uL (ref 0.7–3.1)
MCH: 25.6 pg — ABNORMAL LOW (ref 26.6–33.0)
MCHC: 31.1 g/dL — ABNORMAL LOW (ref 31.5–35.7)
MCV: 82 fL (ref 79–97)
MONOCYTES: 9 %
Monocytes Absolute: 0.5 10*3/uL (ref 0.1–0.9)
NEUTROS PCT: 53 %
Neutrophils Absolute: 2.7 10*3/uL (ref 1.4–7.0)
PLATELETS: 259 10*3/uL (ref 150–450)
RBC: 4.93 x10E6/uL (ref 3.77–5.28)
RDW: 14.7 % (ref 12.3–15.4)
WBC: 5.1 10*3/uL (ref 3.4–10.8)

## 2018-04-22 LAB — COMPREHENSIVE METABOLIC PANEL
ALT: 13 IU/L (ref 0–32)
AST: 14 IU/L (ref 0–40)
Albumin/Globulin Ratio: 1.6 (ref 1.2–2.2)
Albumin: 4.4 g/dL (ref 3.5–5.5)
Alkaline Phosphatase: 94 IU/L (ref 39–117)
BUN/Creatinine Ratio: 11 (ref 9–23)
BUN: 7 mg/dL (ref 6–20)
Bilirubin Total: 0.3 mg/dL (ref 0.0–1.2)
CALCIUM: 9 mg/dL (ref 8.7–10.2)
CO2: 21 mmol/L (ref 20–29)
Chloride: 102 mmol/L (ref 96–106)
Creatinine, Ser: 0.66 mg/dL (ref 0.57–1.00)
GFR, EST AFRICAN AMERICAN: 130 mL/min/{1.73_m2} (ref >59)
GFR, EST NON AFRICAN AMERICAN: 112 mL/min/{1.73_m2} (ref >59)
GLUCOSE: 89 mg/dL (ref 65–99)
Globulin, Total: 2.8 g/dL (ref 1.5–4.5)
Potassium: 4.5 mmol/L (ref 3.5–5.2)
Sodium: 138 mmol/L (ref 134–144)
TOTAL PROTEIN: 7.2 g/dL (ref 6.0–8.5)

## 2018-04-22 LAB — LIPASE: Lipase: 24 U/L (ref 14–72)

## 2018-04-23 ENCOUNTER — Encounter: Payer: Self-pay | Admitting: Emergency Medicine

## 2018-04-24 ENCOUNTER — Ambulatory Visit: Payer: Self-pay | Admitting: Emergency Medicine

## 2018-04-27 ENCOUNTER — Encounter: Payer: Self-pay | Admitting: *Deleted

## 2018-04-30 ENCOUNTER — Ambulatory Visit
Admission: RE | Admit: 2018-04-30 | Discharge: 2018-04-30 | Disposition: A | Discharge status: Home / Self Care | Payer: 59 | Source: Ambulatory Visit | Attending: Emergency Medicine | Admitting: Emergency Medicine

## 2018-04-30 DIAGNOSIS — R112 Nausea with vomiting, unspecified: Secondary | ICD-10-CM

## 2018-04-30 DIAGNOSIS — R1013 Epigastric pain: Secondary | ICD-10-CM

## 2018-04-30 DIAGNOSIS — K8020 Calculus of gallbladder without cholecystitis without obstruction: Secondary | ICD-10-CM | POA: Diagnosis not present

## 2018-05-01 ENCOUNTER — Encounter: Payer: Self-pay | Admitting: Emergency Medicine

## 2018-05-01 ENCOUNTER — Other Ambulatory Visit: Payer: Self-pay | Admitting: Emergency Medicine

## 2018-05-01 ENCOUNTER — Encounter: Payer: Self-pay | Admitting: *Deleted

## 2018-05-01 DIAGNOSIS — K8020 Calculus of gallbladder without cholecystitis without obstruction: Secondary | ICD-10-CM

## 2018-05-18 ENCOUNTER — Encounter: Payer: Self-pay | Admitting: Emergency Medicine

## 2018-06-08 ENCOUNTER — Encounter: Payer: Self-pay | Admitting: Emergency Medicine

## 2018-08-31 DIAGNOSIS — K8020 Calculus of gallbladder without cholecystitis without obstruction: Secondary | ICD-10-CM | POA: Diagnosis not present

## 2018-09-25 DIAGNOSIS — K8020 Calculus of gallbladder without cholecystitis without obstruction: Secondary | ICD-10-CM | POA: Diagnosis not present

## 2018-09-30 ENCOUNTER — Other Ambulatory Visit: Payer: Self-pay | Admitting: Surgery

## 2018-09-30 ENCOUNTER — Other Ambulatory Visit (HOSPITAL_COMMUNITY): Payer: Self-pay | Admitting: Surgery

## 2018-10-05 ENCOUNTER — Encounter: Payer: 59 | Attending: Surgery | Admitting: Skilled Nursing Facility1

## 2018-10-05 ENCOUNTER — Encounter: Payer: Self-pay | Admitting: Skilled Nursing Facility1

## 2018-10-05 DIAGNOSIS — E669 Obesity, unspecified: Secondary | ICD-10-CM | POA: Insufficient documentation

## 2018-10-05 NOTE — Progress Notes (Signed)
Pre-Op Assessment Visit:  Pre-Operative Sleeve Gastrectomy Surgery  Medical Nutrition Therapy:  Appt start time: 2:00  End time:  3:06  Patient was seen on 10/05/2018 for Pre-Operative Nutrition Assessment. Assessment and letter of approval faxed to [facility] Surgery Bariatric Surgery Program coordinator on 10/05/2018   Pt states she is planning to have a cholecystectomy during her sleeve but states she is experiencing extreme pain stating her psychologist is booked until May. Pt states she does not drink water because she has to urinate more.   Pt expectation of surgery: to lose weight   Pt expectation of Dietitian: Guidance   Start weight at [facility]: 284.9 BMI: 45.02  24 hr Dietary Recall: First Meal: cereal or toast with cheese jelly or nutella Snack:  Second Meal: eggplant parmasean Snack:  Third Meal: chips or hummus or avacado or celery or carrots  Snack:  Beverages: coffee, tea, water  Encouraged to engage in 150 minutes of moderate physical activity including cardiovascular and weight baring weekly  Handouts given during visit include:  . Pre-Op Goals . Bariatric Surgery Protein Shakes During the appointment today the following Pre-Op Goals were reviewed with the patient: . Maintain or lose weight as instructed by your surgeon . Make healthy food choices . Begin to limit portion sizes . Limited concentrated sugars and fried foods . Keep fat/sugar in the single digits per serving on             food labels . Practice CHEWING your food  (aim for 30 chews per bite or until applesauce consistency) . Practice not drinking 15 minutes before, during, and 30 minutes after each meal/snack . Avoid all carbonated beverages  . Avoid/limit caffeinated beverages  . Avoid all sugar-sweetened beverages . Consume 3 meals per day; eat every 3-5 hours . Make a list of non-food related activities . Aim for 64-100 ounces of FLUID daily  . Aim for at least 60-80 grams of PROTEIN  daily . Look for a liquid protein source that contain ?15 g protein and ?5 g carbohydrate  (ex: shakes, drinks, shots)  -Follow diet recommendations listed below   Energy and Macronutrient Recomendations: Calories: 1800 Carbohydrate: 200g Protein: 135g Fat: 50g  Demonstrated degree of understanding via:  Teach Back  Teaching Method Utilized: Visual Auditory Hands on  Barriers to learning/adherence to lifestyle change: Lack of internal motivation   Patient to call the [facility] to enroll in Pre-Op and Post-Op Nutrition Education when surgery date is scheduled.

## 2018-10-06 ENCOUNTER — Other Ambulatory Visit (HOSPITAL_COMMUNITY): Payer: Self-pay | Admitting: Surgery

## 2018-10-06 ENCOUNTER — Ambulatory Visit (HOSPITAL_COMMUNITY)
Admission: RE | Admit: 2018-10-06 | Discharge: 2018-10-06 | Disposition: A | Discharge status: Home / Self Care | Payer: 59 | Source: Ambulatory Visit | Attending: Surgery | Admitting: Surgery

## 2018-10-06 DIAGNOSIS — Z01818 Encounter for other preprocedural examination: Secondary | ICD-10-CM | POA: Diagnosis not present

## 2018-12-08 ENCOUNTER — Ambulatory Visit: Payer: 59 | Admitting: Psychiatry

## 2019-01-21 DIAGNOSIS — H5201 Hypermetropia, right eye: Secondary | ICD-10-CM | POA: Diagnosis not present

## 2019-01-27 ENCOUNTER — Encounter: Payer: Self-pay | Admitting: Physician Assistant

## 2019-01-29 ENCOUNTER — Other Ambulatory Visit (HOSPITAL_COMMUNITY): Payer: Self-pay | Admitting: Surgery

## 2019-01-29 DIAGNOSIS — K8020 Calculus of gallbladder without cholecystitis without obstruction: Secondary | ICD-10-CM | POA: Diagnosis not present

## 2019-02-02 ENCOUNTER — Other Ambulatory Visit: Payer: Self-pay

## 2019-02-02 ENCOUNTER — Encounter: Payer: 59 | Attending: Surgery | Admitting: Skilled Nursing Facility1

## 2019-02-02 DIAGNOSIS — E669 Obesity, unspecified: Secondary | ICD-10-CM | POA: Diagnosis not present

## 2019-02-02 NOTE — Progress Notes (Signed)
Pre-Operative Nutrition Class:  Appt start time: 0626   End time:  1830.  Patient was seen on 02/02/2019 for Pre-Operative Bariatric Surgery Education at [facility].   Surgery date:  Surgery type: sleeve Start weight at [facility]: 284.9 Weight today: 279.2  Samples given per MNT protocol. Patient educated on appropriate usage: Bariatric Advantage Multivitamin Lot #[id number] Exp:04/21  Bariatric Advantage Calcium  Lot #[id number] Exp:11/14/19   Protein20 Lot #[id number] Exp:12/22/19  The following the learning objectives were met by the patient during this course:  Identify Pre-Op Dietary Goals and will begin 2 weeks pre-operatively  Identify appropriate sources of fluids and proteins   State protein recommendations and appropriate sources pre and post-operatively  Identify Post-Operative Dietary Goals and will follow for 2 weeks post-operatively  Identify appropriate multivitamin and calcium sources  Describe the need for physical activity post-operatively and will follow MD recommendations  State when to call healthcare provider regarding medication questions or post-operative complications  Handouts given during class include:  Pre-Op Bariatric Surgery Diet Handout  Protein Shake Handout  Post-Op Bariatric Surgery Nutrition Handout  [facility] Program Information Flyer  Support Group Information Flyer  [facility] Bariatric Supplements Price List  Follow-Up Plan: Patient will follow-up at [facility] 2 weeks post operatively for diet advancement per MD.

## 2019-02-16 NOTE — Patient Instructions (Addendum)
YOU NEED TO HAVE A COVID 19 TEST ON 02-18-2019 AT 300 PM, THIS TEST MUST BE DONE BEFORE SURGERY, [facility]. ONCE YOUR COVID TEST IS COMPLETED, PLEASE BEGIN THE QUARANTINE INSTRUCTIONS AS OUTLINED IN YOUR HANDOUT.                Lisa GrahamKarima Le  02/16/2019   Your procedure is scheduled on: 02-22-2019  [facility]  Report to SHORT STAY  At 530  AM      Call this number if you have problems the morning of surgery [phone]    Remember: BRUSH YOUR TEETH MORNING OF SURGERY AND RINSE YOUR MOUTH OUT, NO CHEWING GUM CANDY OR MINTS.   MORNING OF SURGERY DRINK:   DRINK 1 G2 drink BEFORE YOU LEAVE HOME, DRINK ALL OF THE  G2 DRINK AT ONE TIME.   NO SOLID FOOD AFTER 600 PM THE NIGHT BEFORE YOUR SURGERY. YOU MAY DRINK CLEAR FLUIDS. THE G2 DRINK YOU DRINK BEFORE YOU LEAVE HOME WILL BE THE LAST FLUIDS YOU DRINK BEFORE SURGERY. DRINK G2 DRINK AT 415 AM.  PAIN IS EXPECTED AFTER SURGERY AND WILL NOT BE COMPLETELY ELIMINATED. AMBULATION AND TYLENOL WILL HELP REDUCE INCISIONAL AND GAS PAIN. MOVEMENT IS KEY!  YOU ARE EXPECTED TO BE OUT OF BED WITHIN 4 HOURS OF ADMISSION TO YOUR PATIENT ROOM.  SITTING IN THE RECLINER THROUGHOUT THE DAY IS IMPORTANT FOR DRINKING FLUIDS AND MOVING GAS THROUGHOUT THE GI TRACT.  COMPRESSION STOCKINGS SHOULD BE WORN [facility] UNLESS YOU ARE WALKING.   INCENTIVE SPIROMETER SHOULD BE USED EVERY HOUR WHILE AWAKE TO DECREASE POST-OPERATIVE COMPLICATIONS SUCH AS PNEUMONIA.  WHEN DISCHARGED HOME, IT IS IMPORTANT TO CONTINUE TO WALK EVERY HOUR AND USE THE INCENTIVE SPIROMETER EVERY HOUR.     CLEAR LIQUID DIET   Foods Allowed                                                                     Foods Excluded  Coffee and tea, regular and decaf                             liquids that you cannot  Plain Jell-O in any flavor                                             see through such  as: Fruit ices (not with fruit pulp)                                     milk, soups, orange juice  Iced Popsicles                                    All solid food Carbonated beverages, regular and diet  Cranberry, grape and apple juices Sports drinks like Gatorade Lightly seasoned clear broth or consume(fat free) Sugar, honey syrup  Sample Menu Breakfast                                Lunch                                     Supper Cranberry juice                    Beef broth                            Chicken broth Jell-O                                     Grape juice                           Apple juice Coffee or tea                        Jell-O                                      Popsicle                                                Coffee or tea                        Coffee or tea  _____________________________________________________________________     Take these medicines the morning of surgery with A SIP OF WATER:                                You may not have any metal on your body including hair pins and              piercings  Do not wear jewelry, make-up, lotions, powders or perfumes, deodorant             Do not wear nail polish.  Do not shave  48 hours prior to surgery.              .   Do not bring valuables to the hospital. [place] IS NOT             RESPONSIBLE   FOR VALUABLES.  Contacts, dentures or bridgework may not be worn into surgery.  Leave suitcase in the car. After surgery it may be brought to your room.          _____________________________________________________________________             [facility] - Preparing for Surgery Before surgery, you can play an important role.  Because skin is not sterile, your skin needs to be as free of germs as possible.  You can reduce the number of germs on your skin  by washing with CHG (chlorahexidine gluconate) soap before surgery.  CHG is an antiseptic cleaner  which kills germs and bonds with the skin to continue killing germs even after washing. Please DO NOT use if you have an allergy to CHG or antibacterial soaps.  If your skin becomes reddened/irritated stop using the CHG and inform your nurse when you arrive at Short Stay. Do not shave (including legs and underarms) for at least 48 hours prior to the first CHG shower.  You may shave your face/neck. Please follow these instructions carefully:  1.  Shower with CHG Soap the night before surgery and the  morning of Surgery.  2.  If you choose to wash your hair, wash your hair first as usual with your  normal  shampoo.  3.  After you shampoo, rinse your hair and body thoroughly to remove the  shampoo.                           4.  Use CHG as you would any other liquid soap.  You can apply chg directly  to the skin and wash                       Gently with a scrungie or clean washcloth.  5.  Apply the CHG Soap to your body ONLY FROM THE NECK DOWN.   Do not use on face/ open                           Wound or open sores. Avoid contact with eyes, ears mouth and genitals (private parts).                       Wash face,  Genitals (private parts) with your normal soap.             6.  Wash thoroughly, paying special attention to the area where your surgery  will be performed.  7.  Thoroughly rinse your body with warm water from the neck down.  8.  DO NOT shower/wash with your normal soap after using and rinsing off  the CHG Soap.                9.  Pat yourself dry with a clean towel.            10.  Wear clean pajamas.            11.  Place clean sheets on your bed the night of your first shower and do not  sleep with pets. Day of Surgery : Do not apply any lotions/deodorants the morning of surgery.  Please wear clean clothes to the hospital/surgery center.  FAILURE TO FOLLOW THESE INSTRUCTIONS MAY RESULT IN THE CANCELLATION OF YOUR SURGERY PATIENT SIGNATURE_________________________________  NURSE  SIGNATURE__________________________________  ________________________________________________________________________   Lisa Le MireIncentive Spirometer  An incentive spirometer is a tool that can help keep your lungs clear and active. This tool measures how well you are filling your lungs with each breath. Taking long deep breaths may help reverse or decrease the chance of developing breathing (pulmonary) problems (especially infection) following:  A long period of time when you are unable to move or be active. BEFORE THE PROCEDURE   If the spirometer includes an indicator to show your best effort, your nurse or respiratory therapist will set it to a desired goal.  If possible, sit  up straight or lean slightly forward. Try not to slouch.  Hold the incentive spirometer in an upright position. INSTRUCTIONS FOR USE  1. Sit on the edge of your bed if possible, or sit up as far as you can in bed or on a chair. 2. Hold the incentive spirometer in an upright position. 3. Breathe out normally. 4. Place the mouthpiece in your mouth and seal your lips tightly around it. 5. Breathe in slowly and as deeply as possible, raising the piston or the ball toward the top of the column. 6. Hold your breath for 3-5 seconds or for as long as possible. Allow the piston or ball to fall to the bottom of the column. 7. Remove the mouthpiece from your mouth and breathe out normally. 8. Rest for a few seconds and repeat Steps 1 through 7 at least 10 times every 1-2 hours when you are awake. Take your time and take a few normal breaths between deep breaths. 9. The spirometer may include an indicator to show your best effort. Use the indicator as a goal to work toward during each repetition. 10. After each set of 10 deep breaths, practice coughing to be sure your lungs are clear. If you have an incision (the cut made at the time of surgery), support your incision when coughing by placing a pillow or rolled up towels firmly  against it. Once you are able to get out of bed, walk around indoors and cough well. You may stop using the incentive spirometer when instructed by your caregiver.  RISKS AND COMPLICATIONS  Take your time so you do not get dizzy or light-headed.  If you are in pain, you may need to take or ask for pain medication before doing incentive spirometry. It is harder to take a deep breath if you are having pain. AFTER USE  Rest and breathe slowly and easily.  It can be helpful to keep track of a log of your progress. Your caregiver can provide you with a simple table to help with this. If you are using the spirometer at home, follow these instructions: Reading IF:   You are having difficultly using the spirometer.  You have trouble using the spirometer as often as instructed.  Your pain medication is not giving enough relief while using the spirometer.  You develop fever of 100.5 F (38.1 C) or higher. SEEK IMMEDIATE MEDICAL CARE IF:   You cough up bloody sputum that had not been present before.  You develop fever of 102 F (38.9 C) or greater.  You develop worsening pain at or near the incision site. MAKE SURE YOU:   Understand these instructions.  Will watch your condition.  Will get help right away if you are not doing well or get worse. Document Released: 12/02/2006 Document Revised: 10/14/2011 Document Reviewed: 02/02/2007 ExitCare Patient Information 2014 ExitCare, [place].   ________________________________________________________________________  WHAT IS A BLOOD TRANSFUSION? Blood Transfusion Information  A transfusion is the replacement of blood or some of its parts. Blood is made up of multiple cells which provide different functions.  Red blood cells carry oxygen and are used for blood loss replacement.  White blood cells fight against infection.  Platelets control bleeding.  Plasma helps clot blood.  Other blood products are available for  specialized needs, such as hemophilia or other clotting disorders. BEFORE THE TRANSFUSION  Who gives blood for transfusions?   Healthy volunteers who are fully evaluated to make sure their blood is safe. This is  blood bank blood. Transfusion therapy is the safest it has ever been in the practice of medicine. Before blood is taken from a donor, a complete history is taken to make sure that person has no history of diseases nor engages in risky social behavior (examples are intravenous drug use or sexual activity with multiple partners). The donor's travel history is screened to minimize risk of transmitting infections, such as malaria. The donated blood is tested for signs of infectious diseases, such as HIV and hepatitis. The blood is then tested to be sure it is compatible with you in order to minimize the chance of a transfusion reaction. If you or a relative donates blood, this is often done in anticipation of surgery and is not appropriate for emergency situations. It takes many days to process the donated blood. RISKS AND COMPLICATIONS Although transfusion therapy is very safe and saves many lives, the main dangers of transfusion include:   Getting an infectious disease.  Developing a transfusion reaction. This is an allergic reaction to something in the blood you were given. Every precaution is taken to prevent this. The decision to have a blood transfusion has been considered carefully by your caregiver before blood is given. Blood is not given unless the benefits outweigh the risks. AFTER THE TRANSFUSION  Right after receiving a blood transfusion, you will usually feel much better and more energetic. This is especially true if your red blood cells have gotten low (anemic). The transfusion raises the level of the red blood cells which carry oxygen, and this usually causes an energy increase.  The nurse administering the transfusion will monitor you carefully for complications. HOME CARE  INSTRUCTIONS  No special instructions are needed after a transfusion. You may find your energy is better. Speak with your caregiver about any limitations on activity for underlying diseases you may have. SEEK MEDICAL CARE IF:   Your condition is not improving after your transfusion.  You develop redness or irritation at the intravenous (IV) site. SEEK IMMEDIATE MEDICAL CARE IF:  Any of the following symptoms occur over the next 12 hours:  Shaking chills.  You have a temperature by mouth above 102 F (38.9 C), not controlled by medicine.  Chest, back, or muscle pain.  People around you feel you are not acting correctly or are confused.  Shortness of breath or difficulty breathing.  Dizziness and fainting.  You get a rash or develop hives.  You have a decrease in urine output.  Your urine turns a dark color or changes to pink, red, or brown. Any of the following symptoms occur over the next 10 days:  You have a temperature by mouth above 102 F (38.9 C), not controlled by medicine.  Shortness of breath.  Weakness after normal activity.  The white part of the eye turns yellow (jaundice).  You have a decrease in the amount of urine or are urinating less often.  Your urine turns a dark color or changes to pink, red, or brown. Document Released: 07/19/2000 Document Revised: 10/14/2011 Document Reviewed: 03/07/2008 [facility] Patient Information 2014 [place].  _______________________________________________________________________

## 2019-02-16 NOTE — Progress Notes (Signed)
CALLED AND SPOKE WITH DR Lucia Gaskins AND PATIENT DOES NOT NEED EKG OR CHEST XRAY PRIOR TO 02-22-19 SURGERY.

## 2019-02-18 ENCOUNTER — Other Ambulatory Visit (HOSPITAL_COMMUNITY): Payer: 59

## 2019-02-18 ENCOUNTER — Inpatient Hospital Stay (HOSPITAL_COMMUNITY)
Admission: RE | Admit: 2019-02-18 | Discharge: 2019-02-18 | Disposition: A | Discharge status: Home / Self Care | Payer: 59 | Source: Ambulatory Visit

## 2019-02-18 ENCOUNTER — Encounter (HOSPITAL_COMMUNITY)
Admission: RE | Admit: 2019-02-18 | Discharge: 2019-02-18 | Disposition: A | Discharge status: Home / Self Care | Payer: 59 | Source: Ambulatory Visit | Attending: Surgery | Admitting: Surgery

## 2019-02-18 ENCOUNTER — Encounter (HOSPITAL_COMMUNITY): Payer: Self-pay

## 2019-02-18 ENCOUNTER — Other Ambulatory Visit: Payer: Self-pay

## 2019-02-18 ENCOUNTER — Other Ambulatory Visit (HOSPITAL_COMMUNITY)
Admission: RE | Admit: 2019-02-18 | Discharge: 2019-02-18 | Disposition: A | Discharge status: Home / Self Care | Payer: 59 | Source: Ambulatory Visit | Attending: Surgery | Admitting: Surgery

## 2019-02-18 ENCOUNTER — Other Ambulatory Visit (HOSPITAL_COMMUNITY): Payer: Self-pay | Admitting: *Deleted

## 2019-02-18 DIAGNOSIS — Z1159 Encounter for screening for other viral diseases: Secondary | ICD-10-CM | POA: Insufficient documentation

## 2019-02-18 DIAGNOSIS — K7581 Nonalcoholic steatohepatitis (NASH): Secondary | ICD-10-CM | POA: Diagnosis not present

## 2019-02-18 DIAGNOSIS — Z01812 Encounter for preprocedural laboratory examination: Secondary | ICD-10-CM | POA: Insufficient documentation

## 2019-02-18 DIAGNOSIS — R1011 Right upper quadrant pain: Secondary | ICD-10-CM | POA: Insufficient documentation

## 2019-02-18 DIAGNOSIS — K819 Cholecystitis, unspecified: Secondary | ICD-10-CM | POA: Insufficient documentation

## 2019-02-18 LAB — COMPREHENSIVE METABOLIC PANEL
ALT: 16 U/L (ref 0–44)
AST: 18 U/L (ref 15–41)
Albumin: 4.6 g/dL (ref 3.5–5.0)
Alkaline Phosphatase: 74 U/L (ref 38–126)
Anion gap: 12 (ref 5–15)
BUN: 13 mg/dL (ref 6–20)
CO2: 25 mmol/L (ref 22–32)
Calcium: 9.4 mg/dL (ref 8.9–10.3)
Chloride: 101 mmol/L (ref 98–111)
Creatinine, Ser: 0.63 mg/dL (ref 0.44–1.00)
GFR calc Af Amer: >60 mL/min (ref >60)
GFR calc non Af Amer: >60 mL/min (ref >60)
Glucose, Bld: 81 mg/dL (ref 70–99)
Potassium: 4.3 mmol/L (ref 3.5–5.1)
Sodium: 138 mmol/L (ref 135–145)
Total Bilirubin: 0.4 mg/dL (ref 0.3–1.2)
Total Protein: 7.9 g/dL (ref 6.5–8.1)

## 2019-02-18 LAB — CBC WITH DIFFERENTIAL/PLATELET
Abs Immature Granulocytes: 0.03 10*3/uL (ref 0.00–0.07)
Basophils Absolute: 0 10*3/uL (ref 0.0–0.1)
Basophils Relative: 0 %
Eosinophils Absolute: 0.1 10*3/uL (ref 0.0–0.5)
Eosinophils Relative: 1 %
HCT: 40.8 % (ref 36.0–46.0)
Hemoglobin: 13 g/dL (ref 12.0–15.0)
Immature Granulocytes: 0 %
Lymphocytes Relative: 33 %
Lymphs Abs: 2.2 10*3/uL (ref 0.7–4.0)
MCH: 27 pg (ref 26.0–34.0)
MCHC: 31.9 g/dL (ref 30.0–36.0)
MCV: 84.6 fL (ref 80.0–100.0)
Monocytes Absolute: 0.5 10*3/uL (ref 0.1–1.0)
Monocytes Relative: 8 %
Neutro Abs: 3.9 10*3/uL (ref 1.7–7.7)
Neutrophils Relative %: 58 %
Platelets: 249 10*3/uL (ref 150–400)
RBC: 4.82 MIL/uL (ref 3.87–5.11)
RDW: 13.5 % (ref 11.5–15.5)
WBC: 6.7 10*3/uL (ref 4.0–10.5)
nRBC: 0 % (ref 0.0–0.2)

## 2019-02-18 LAB — ABO/RH: ABO/RH(D): O POS

## 2019-02-18 LAB — SARS CORONAVIRUS 2 (TAT 6-24 HRS): SARS Coronavirus 2: NEGATIVE

## 2019-02-18 NOTE — Patient Instructions (Addendum)
YOU NEED TO HAVE A COVID 19 TEST ON 02-18-19  @ 3:10 PM, THIS TEST MUST BE DONE BEFORE SURGERY, COME TO [place] ENTRANCE. ONCE YOUR COVID TEST IS COMPLETED, PLEASE BEGIN THE QUARANTINE INSTRUCTIONS AS OUTLINED IN YOUR HANDOUT.                Lisa Le  02/18/2019   Your procedure is scheduled on:  02-22-19    Report to [facility] Main  Entrance    Report to Admitting at 5:30 AM    Call this number if you have problems the morning of surgery [phone]    Remember: Do not eat food or drink liquids :After Midnight.     Take these medicines the morning of surgery with A SIP OF WATER: None   BRUSH YOUR TEETH MORNING OF SURGERY AND RINSE YOUR MOUTH OUT, NO CHEWING GUM CANDY OR MINTS.                                You may not have any metal on your body including hair pins and              piercings     Do not wear jewelry, make-up, lotions, powders or perfumes, deodorant              Do not wear nail polish.  Do not shave  48 hours prior to surgery.               Do not bring valuables to the hospital. [place].  Contacts, dentures or bridgework may not be worn into surgery.               Please read over the following fact sheets you were given: _____________________________________________________________________             [facility] - Preparing for Surgery Before surgery, you can play an important role.  Because skin is not sterile, your skin needs to be as free of germs as possible.  You can reduce the number of germs on your skin by washing with CHG (chlorahexidine gluconate) soap before surgery.  CHG is an antiseptic cleaner which kills germs and bonds with the skin to continue killing germs even after washing. Please DO NOT use if you have an allergy to CHG or antibacterial soaps.  If your skin becomes reddened/irritated stop using the CHG and inform your nurse when you  arrive at Short Stay. Do not shave (including legs and underarms) for at least 48 hours prior to the first CHG shower.  You may shave your face/neck. Please follow these instructions carefully:  1.  Shower with CHG Soap the night before surgery and the  morning of Surgery.  2.  If you choose to wash your hair, wash your hair first as usual with your  normal  shampoo.  3.  After you shampoo, rinse your hair and body thoroughly to remove the  shampoo.                           4.  Use CHG as you would any other liquid soap.  You can apply chg directly  to the skin and wash  Gently with a scrungie or clean washcloth.  5.  Apply the CHG Soap to your body ONLY FROM THE NECK DOWN.   Do not use on face/ open                           Wound or open sores. Avoid contact with eyes, ears mouth and genitals (private parts).                       Wash face,  Genitals (private parts) with your normal soap.             6.  Wash thoroughly, paying special attention to the area where your surgery  will be performed.  7.  Thoroughly rinse your body with warm water from the neck down.  8.  DO NOT shower/wash with your normal soap after using and rinsing off  the CHG Soap.                9.  Pat yourself dry with a clean towel.            10.  Wear clean pajamas.            11.  Place clean sheets on your bed the night of your first shower and do not  sleep with pets. Day of Surgery : Do not apply any lotions/deodorants the morning of surgery.  Please wear clean clothes to the hospital/surgery center.  FAILURE TO FOLLOW THESE INSTRUCTIONS MAY RESULT IN THE CANCELLATION OF YOUR SURGERY PATIENT SIGNATURE_________________________________  NURSE SIGNATURE__________________________________  ________________________________________________________________________

## 2019-02-20 NOTE — H&P (Signed)
Lisa Le Location: [facility] Surgery Patient #: [id number] DOB: [birth year] Single / Language: Cleophus Molt / Race: Refused to Report/Unreported Female  History of Present Illness   The patient is a 38 year old female who presents with abdominal pain.  The PCP is Dr. Agustina Caroli. She has seen him once.  She comes by herself today.  [The Covid-19 virus has disrupted normal medical care in [place] and across the nation. We have sometimes had to alter normal surgical/medical care to limit this epidemic and we have explained these changes to the patient.]  She has had 6 or 7 attacks of gallbladder disease since I last saw her. Because of the Covid virus, she's tried to avoid going to the ER. She is interested in doing both the sleeve gastrectomy and gallbladder surgery at the same time. I went over with her again surgery for weight loss. I'll expect the gallbladder surgery to add about 1 hour to the operation. I talked about the postop recovery. We talked about her being out of work for about 1 month. We talked about a goal weight of between 190 - 195 pounds. I told her this equates to a BMI of about 30. We will schedule surgery when available.  UGI - 10/06/2018 - normal She saw Alexis scotece - 10/05/2018 Psych visit - Paulino Door, [facility] - 10/20/2018 - this is an online service She completed the EMMI video  History of weight issues and gall bladder disease: She saw Dr. Marlou Starks for gall bladder disease - but decided if she needed surgery, to consider wieght loss surgery at the same time. She has been to a online information session. She is interested in a sleeve gastrectomy. She has several coworkers who have successful surgery and done well.  She has been overweight much of her adult life. She has tried multiple diets including: Weight Watchers, low carb diet, juice diet, and diet focused with exercise. She tired one diet pill - Allie  thing (?), but stopped after one month because she did not like the medicine. She has known gall stones and has seen Dr. Marlou Starks for this. She would like the weight loss surgery and gall bladder surger at the same time. This is possible.  Per the [facility], the patient is a candidate for bariatric surgery. The patient attended our initial information session and reviewed the types of bariatric surgery.  The patient is interested in the sleeve gastrectomy. I discussed with the patient the indications and risks of bariatric surgery. The potential risks of surgery include, but are not limited to, bleeding, infection, leak from the bowel, DVT and PE, open surgery, long term nutrition consequences, and death. The patient understands the importance of compliance and long term follow-up with our group after surgery. From here we will obtain lab tests, x-rays, nutrition consult, and psych consult.  Plan: 1) UGI, 2) Nutrtiion visit, 3) Psych visit, 4) EMMI video for sleeve and operative consent  Review of Systems as stated in this history (HPI) or in the review of systems. Otherwise all other 12 point ROS are negative  Past Medical History: 1. Morbid obesity 2. Symptomatic gall stones She just saw Dr. Marlou Starks for potential gall bladder surgery We are asked to the patient in consultation by Dr. Agustina Caroli to evaluate her for symptomatic gallstones. The patient is a 38 year old female who first had an episode of nausea and vomiting in 2017. The vomiting was associated with significant back pain. Since that time she has had  several other episodes. The most recent one was in September 2019. An ultrasound on 04/30/2018 showed a 3.4 cm gallstone in her gallbladder but no gallbladder wall thickening or ductal dilatation. Her liver functions were normal. She has never had abdominal surgery before. 3. Left trimaleolar fx - 12/27/2014 [facility]  [facility] Left ankle surgery - 07/25/2016 - [facility] 4. Snores but scores only 2/8 on STOP-Bang  Social History: Divorced Daughter, Modesta MessingSelma (39 yo) She has a sister who lives here - but her sister is very busy. She works for [employer] - for [employer] - she works from home She is originally from [place] - she came here in 2000   Allergies (April Staton, [place]; 01/29/2019 11:37 AM) Latex  No Known Drug Allergies  [01/29/2019]:  Medication History (April Staton, CMA; 01/29/2019 11:37 AM) No Current Medications Medications Reconciled  Vitals (April Staton CMA; 01/29/2019 11:39 AM) 01/29/2019 11:38 AM Weight: 279.13 lb Height: 67in Body Surface Area: 2.33 m Body Mass Index: 43.72 kg/m  Temp.: 31F (Oral)  Pulse: 98 (Regular)  BP: 128/84(Sitting, Left Arm, Standard)    Physical Exam  General: Obese F who is alert and generally healthy appearing. She has a pleasant personality. She is wearing a mask. She is wearing a head scarf. HEENT: Normal. Pupils equal.  Neck: Supple. No mass. No thyroid mass.  Lymph Nodes: No supraclavicular or cervical nodes.  Lungs: Clear to auscultation and symmetric breath sounds. Heart: RRR. No murmur or rub.  Abdomen: Soft. No mass. No tenderness. No hernia. Normal bowel sounds. No abdominal scars. She is about 1/2 apple and 1/2 pear Rectal: Not done.  Extremities: Good strength and ROM in upper and lower extremities. Scar at left ankle. Some minimal left leg swelling.  Neurologic: Grossly intact to motor and sensory function. Psychiatric: Has normal mood and affect. Behavior is normal.  Assessment & Plan  1.  MORBID OBESITY (E66.01)  Plan:  1) Laparoscopic sleeve gastrectomy, cholecystectomy, cholangiogram - to be scheduled  2.  GALLSTONES (K80.20)  Plan:  1) To do cholecystectomy at the time of sleeve gastrectomy 3. Left trimaleolar fx - 12/27/2014 [facility]  Left ankle surgery -  07/25/2016 - [facility] 4. Snores but scores only 2/8 on STOP-Bang   Ovidio Kinavid Meriel Kelliher, MD, [facility] Pager: [phone] Office phone:  [phone]

## 2019-02-21 MED ORDER — BUPIVACAINE LIPOSOME 1.3 % IJ SUSP
20.0000 mL | Freq: Once | INTRAMUSCULAR | Status: DC
  Filled 2019-02-21: qty 20

## 2019-02-21 NOTE — Anesthesia Preprocedure Evaluation (Addendum)
Anesthesia Evaluation  Patient identified by MRN, date of birth, ID band Patient awake    Reviewed: Allergy & Precautions, NPO status , Patient's Chart, lab work & pertinent test results  History of Anesthesia Complications Negative for: history of anesthetic complications  Airway Mallampati: II  TM Distance: >3 FB Neck ROM: Full    Dental  (+) Dental Advisory Given, Caps   Pulmonary neg pulmonary ROS,    breath sounds clear to auscultation       Cardiovascular negative cardio ROS   Rhythm:Regular Rate:Normal     Neuro/Psych  Headaches, negative psych ROS   GI/Hepatic Neg liver ROS, GERD  Medicated and Controlled,  Endo/Other  Morbid obesity  Renal/GU negative Renal ROS     Musculoskeletal  (+) Arthritis ,   Abdominal   Peds  Hematology negative hematology ROS (+)   Anesthesia Other Findings   Reproductive/Obstetrics                            Anesthesia Physical Anesthesia Plan  ASA: III  Anesthesia Plan: General   Post-op Pain Management:    Induction: Intravenous  PONV Risk Score and Plan: 4 or greater and Treatment may vary due to age or medical condition, Ondansetron, Midazolam, Scopolamine patch - Pre-op and Dexamethasone  Airway Management Planned: Oral ETT  Additional Equipment: None  Intra-op Plan:   Post-operative Plan: Extubation in OR  Informed Consent: I have reviewed the patients History and Physical, chart, labs and discussed the procedure including the risks, benefits and alternatives for the proposed anesthesia with the patient or authorized representative who has indicated his/her understanding and acceptance.     Dental advisory given  Plan Discussed with: CRNA and Anesthesiologist  Anesthesia Plan Comments:        Anesthesia Quick Evaluation

## 2019-02-22 ENCOUNTER — Other Ambulatory Visit: Payer: Self-pay

## 2019-02-22 ENCOUNTER — Encounter (HOSPITAL_COMMUNITY)
Admission: RE | Disposition: A | Discharge status: Home / Self Care | Payer: Self-pay | Source: Home / Self Care | Attending: Surgery

## 2019-02-22 ENCOUNTER — Inpatient Hospital Stay (HOSPITAL_COMMUNITY): Payer: 59

## 2019-02-22 ENCOUNTER — Inpatient Hospital Stay (HOSPITAL_COMMUNITY): Payer: 59 | Admitting: Certified Registered"

## 2019-02-22 ENCOUNTER — Inpatient Hospital Stay (HOSPITAL_COMMUNITY): Payer: 59 | Admitting: Emergency Medicine

## 2019-02-22 ENCOUNTER — Inpatient Hospital Stay (HOSPITAL_COMMUNITY)
Admission: RE | Admit: 2019-02-22 | Discharge: 2019-02-23 | DRG: 620 | Disposition: A | Discharge status: Home / Self Care | Payer: 59 | Attending: Surgery | Admitting: Surgery

## 2019-02-22 ENCOUNTER — Encounter (HOSPITAL_COMMUNITY): Payer: Self-pay | Admitting: *Deleted

## 2019-02-22 DIAGNOSIS — K219 Gastro-esophageal reflux disease without esophagitis: Secondary | ICD-10-CM | POA: Diagnosis present

## 2019-02-22 DIAGNOSIS — Z6841 Body Mass Index (BMI) 40.0 and over, adult: Secondary | ICD-10-CM

## 2019-02-22 DIAGNOSIS — K8010 Calculus of gallbladder with chronic cholecystitis without obstruction: Secondary | ICD-10-CM | POA: Diagnosis present

## 2019-02-22 DIAGNOSIS — Z1159 Encounter for screening for other viral diseases: Secondary | ICD-10-CM

## 2019-02-22 DIAGNOSIS — K8020 Calculus of gallbladder without cholecystitis without obstruction: Secondary | ICD-10-CM

## 2019-02-22 DIAGNOSIS — K915 Postcholecystectomy syndrome: Secondary | ICD-10-CM | POA: Diagnosis not present

## 2019-02-22 DIAGNOSIS — Z91040 Latex allergy status: Secondary | ICD-10-CM

## 2019-02-22 HISTORY — PX: LAPAROSCOPIC GASTRIC SLEEVE RESECTION: SHX5895

## 2019-02-22 HISTORY — PX: CHOLECYSTECTOMY: SHX55

## 2019-02-22 LAB — PREGNANCY, URINE: Preg Test, Ur: NEGATIVE

## 2019-02-22 LAB — HEMOGLOBIN AND HEMATOCRIT, BLOOD
HCT: 39.2 % (ref 36.0–46.0)
Hemoglobin: 12.5 g/dL (ref 12.0–15.0)

## 2019-02-22 LAB — TYPE AND SCREEN
ABO/RH(D): O POS
Antibody Screen: NEGATIVE

## 2019-02-22 SURGERY — GASTRECTOMY, SLEEVE, LAPAROSCOPIC
Anesthesia: General | Site: Abdomen

## 2019-02-22 MED ORDER — FENTANYL CITRATE (PF) 250 MCG/5ML IJ SOLN
INTRAMUSCULAR | Status: AC
  Filled 2019-02-22: qty 5

## 2019-02-22 MED ORDER — ONDANSETRON HCL 4 MG/2ML IJ SOLN
4.0000 mg | INTRAMUSCULAR | Status: DC | PRN
  Administered 2019-02-22: 4 mg via INTRAVENOUS
  Filled 2019-02-22: qty 2

## 2019-02-22 MED ORDER — KCL IN DEXTROSE-NACL 20-5-0.45 MEQ/L-%-% IV SOLN
INTRAVENOUS | Status: DC
  Administered 2019-02-22 – 2019-02-23 (×3): via INTRAVENOUS
  Filled 2019-02-22 (×3): qty 1000

## 2019-02-22 MED ORDER — MIDAZOLAM HCL 2 MG/2ML IJ SOLN
INTRAMUSCULAR | Status: AC
  Filled 2019-02-22: qty 2

## 2019-02-22 MED ORDER — MORPHINE SULFATE (PF) 4 MG/ML IV SOLN
1.0000 mg | INTRAVENOUS | Status: DC | PRN
  Administered 2019-02-22: 1 mg via INTRAVENOUS
  Filled 2019-02-22: qty 1

## 2019-02-22 MED ORDER — OXYCODONE HCL 5 MG/5ML PO SOLN
5.0000 mg | ORAL | Status: DC | PRN
  Administered 2019-02-23: 07:00:00 10 mg via ORAL
  Filled 2019-02-22: qty 10

## 2019-02-22 MED ORDER — ENSURE MAX PROTEIN PO LIQD
2.0000 [oz_av] | ORAL | Status: DC
  Administered 2019-02-23 (×3): 2 [oz_av] via ORAL

## 2019-02-22 MED ORDER — PROMETHAZINE HCL 25 MG/ML IJ SOLN: 6.2500 mg | INTRAMUSCULAR | Status: DC | PRN

## 2019-02-22 MED ORDER — SUGAMMADEX SODIUM 200 MG/2ML IV SOLN
INTRAVENOUS | Status: DC | PRN
  Administered 2019-02-22: 250 mg via INTRAVENOUS

## 2019-02-22 MED ORDER — TISSEEL VH 10 ML EX KIT
PACK | CUTANEOUS | Status: DC | PRN
  Administered 2019-02-22: 10 mL

## 2019-02-22 MED ORDER — ROCURONIUM BROMIDE 10 MG/ML (PF) SYRINGE
PREFILLED_SYRINGE | INTRAVENOUS | Status: AC
  Filled 2019-02-22: qty 10

## 2019-02-22 MED ORDER — LABETALOL HCL 5 MG/ML IV SOLN
INTRAVENOUS | Status: AC
  Filled 2019-02-22: qty 4

## 2019-02-22 MED ORDER — STERILE WATER FOR IRRIGATION IR SOLN
Status: DC | PRN
  Administered 2019-02-22: 1000 mL

## 2019-02-22 MED ORDER — FENTANYL CITRATE (PF) 250 MCG/5ML IJ SOLN
INTRAMUSCULAR | Status: DC | PRN
  Administered 2019-02-22 (×2): 50 ug via INTRAVENOUS
  Administered 2019-02-22: 100 ug via INTRAVENOUS

## 2019-02-22 MED ORDER — PROPOFOL 10 MG/ML IV BOLUS
INTRAVENOUS | Status: DC | PRN
  Administered 2019-02-22: 180 mg via INTRAVENOUS

## 2019-02-22 MED ORDER — LIDOCAINE 2% (20 MG/ML) 5 ML SYRINGE
INTRAMUSCULAR | Status: DC | PRN
  Administered 2019-02-22: 60 mg via INTRAVENOUS

## 2019-02-22 MED ORDER — SODIUM CHLORIDE 0.9 % IV SOLN
2.0000 g | INTRAVENOUS | Status: DC
  Filled 2019-02-22: qty 2

## 2019-02-22 MED ORDER — MIDAZOLAM HCL 2 MG/2ML IJ SOLN
INTRAMUSCULAR | Status: DC | PRN
  Administered 2019-02-22: 2 mg via INTRAVENOUS

## 2019-02-22 MED ORDER — TISSEEL VH 10 ML EX KIT
PACK | CUTANEOUS | Status: AC
  Filled 2019-02-22: qty 1

## 2019-02-22 MED ORDER — ACETAMINOPHEN 160 MG/5ML PO SOLN
1000.0000 mg | Freq: Three times a day (TID) | ORAL | Status: DC
  Administered 2019-02-22 – 2019-02-23 (×3): 1000 mg via ORAL
  Filled 2019-02-22 (×3): qty 40.6

## 2019-02-22 MED ORDER — LACTATED RINGERS IR SOLN
Status: DC | PRN
  Administered 2019-02-22: 1000 mL

## 2019-02-22 MED ORDER — BUPIVACAINE-EPINEPHRINE 0.25% -1:200000 IJ SOLN
INTRAMUSCULAR | Status: DC | PRN
  Administered 2019-02-22: 20 mL

## 2019-02-22 MED ORDER — LIDOCAINE 2% (20 MG/ML) 5 ML SYRINGE
INTRAMUSCULAR | Status: AC
  Filled 2019-02-22: qty 5

## 2019-02-22 MED ORDER — LACTATED RINGERS IV SOLN
INTRAVENOUS | Status: DC
  Administered 2019-02-22 (×2): via INTRAVENOUS

## 2019-02-22 MED ORDER — PROPOFOL 10 MG/ML IV BOLUS
INTRAVENOUS | Status: AC
  Filled 2019-02-22: qty 40

## 2019-02-22 MED ORDER — PANTOPRAZOLE SODIUM 40 MG IV SOLR
40.0000 mg | Freq: Every day | INTRAVENOUS | Status: DC
  Administered 2019-02-22: 21:00:00 40 mg via INTRAVENOUS
  Filled 2019-02-22: qty 40

## 2019-02-22 MED ORDER — CHLORHEXIDINE GLUCONATE 4 % EX LIQD: 60.0000 mL | Freq: Once | CUTANEOUS | Status: DC

## 2019-02-22 MED ORDER — LACTATED RINGERS IV SOLN
INTRAVENOUS | Status: DC
  Administered 2019-02-22: 07:00:00 via INTRAVENOUS

## 2019-02-22 MED ORDER — GABAPENTIN 300 MG PO CAPS
300.0000 mg | ORAL_CAPSULE | ORAL | Status: AC
  Administered 2019-02-22: 06:00:00 300 mg via ORAL
  Filled 2019-02-22: qty 1

## 2019-02-22 MED ORDER — HEPARIN SODIUM (PORCINE) 5000 UNIT/ML IJ SOLN
5000.0000 [IU] | INTRAMUSCULAR | Status: AC
  Administered 2019-02-22: 06:00:00 5000 [IU] via SUBCUTANEOUS
  Filled 2019-02-22: qty 1

## 2019-02-22 MED ORDER — APREPITANT 40 MG PO CAPS
40.0000 mg | ORAL_CAPSULE | ORAL | Status: AC
  Administered 2019-02-22: 40 mg via ORAL
  Filled 2019-02-22: qty 1

## 2019-02-22 MED ORDER — LABETALOL HCL 5 MG/ML IV SOLN
INTRAVENOUS | Status: DC | PRN
  Administered 2019-02-22 (×3): 2.5 mg via INTRAVENOUS

## 2019-02-22 MED ORDER — ROCURONIUM BROMIDE 10 MG/ML (PF) SYRINGE
PREFILLED_SYRINGE | INTRAVENOUS | Status: DC | PRN
  Administered 2019-02-22: 10 mg via INTRAVENOUS
  Administered 2019-02-22: 70 mg via INTRAVENOUS
  Administered 2019-02-22: 10 mg via INTRAVENOUS

## 2019-02-22 MED ORDER — BUPIVACAINE-EPINEPHRINE (PF) 0.25% -1:200000 IJ SOLN
INTRAMUSCULAR | Status: AC
  Filled 2019-02-22: qty 30

## 2019-02-22 MED ORDER — ONDANSETRON HCL 4 MG/2ML IJ SOLN
INTRAMUSCULAR | Status: AC
  Filled 2019-02-22: qty 2

## 2019-02-22 MED ORDER — KETAMINE HCL 10 MG/ML IJ SOLN
INTRAMUSCULAR | Status: AC
  Filled 2019-02-22: qty 1

## 2019-02-22 MED ORDER — ESMOLOL HCL 100 MG/10ML IV SOLN
INTRAVENOUS | Status: DC | PRN
  Administered 2019-02-22: 30 mg via INTRAVENOUS

## 2019-02-22 MED ORDER — DEXAMETHASONE SODIUM PHOSPHATE 10 MG/ML IJ SOLN
INTRAMUSCULAR | Status: AC
  Filled 2019-02-22: qty 1

## 2019-02-22 MED ORDER — BUPIVACAINE LIPOSOME 1.3 % IJ SUSP
INTRAMUSCULAR | Status: DC | PRN
  Administered 2019-02-22: 10 mL

## 2019-02-22 MED ORDER — ACETAMINOPHEN 500 MG PO TABS
1000.0000 mg | ORAL_TABLET | ORAL | Status: AC
  Administered 2019-02-22: 1000 mg via ORAL
  Filled 2019-02-22: qty 2

## 2019-02-22 MED ORDER — ACETAMINOPHEN 500 MG PO TABS: 1000.0000 mg | ORAL_TABLET | Freq: Three times a day (TID) | ORAL | Status: DC

## 2019-02-22 MED ORDER — FENTANYL CITRATE (PF) 100 MCG/2ML IJ SOLN
INTRAMUSCULAR | Status: AC
  Administered 2019-02-22: 25 ug via INTRAVENOUS
  Filled 2019-02-22: qty 2

## 2019-02-22 MED ORDER — LIDOCAINE 2% (20 MG/ML) 5 ML SYRINGE
INTRAMUSCULAR | Status: DC | PRN
  Administered 2019-02-22: 1.5 mg/kg/h via INTRAVENOUS

## 2019-02-22 MED ORDER — 0.9 % SODIUM CHLORIDE (POUR BTL) OPTIME
TOPICAL | Status: DC | PRN
  Administered 2019-02-22: 1000 mL

## 2019-02-22 MED ORDER — DEXAMETHASONE SODIUM PHOSPHATE 10 MG/ML IJ SOLN
INTRAMUSCULAR | Status: DC | PRN
  Administered 2019-02-22: 8 mg via INTRAVENOUS

## 2019-02-22 MED ORDER — FENTANYL CITRATE (PF) 100 MCG/2ML IJ SOLN
25.0000 ug | INTRAMUSCULAR | Status: DC | PRN
  Administered 2019-02-22 (×3): 25 ug via INTRAVENOUS

## 2019-02-22 MED ORDER — ENOXAPARIN SODIUM 30 MG/0.3ML ~~LOC~~ SOLN
30.0000 mg | Freq: Two times a day (BID) | SUBCUTANEOUS | Status: DC
  Administered 2019-02-22 – 2019-02-23 (×2): 30 mg via SUBCUTANEOUS
  Filled 2019-02-22 (×2): qty 0.3

## 2019-02-22 MED ORDER — KETAMINE HCL 10 MG/ML IJ SOLN
INTRAMUSCULAR | Status: DC | PRN
  Administered 2019-02-22: 30 mg via INTRAVENOUS

## 2019-02-22 MED ORDER — OXYCODONE HCL 5 MG PO TABS: 5.0000 mg | ORAL_TABLET | Freq: Once | ORAL | Status: DC | PRN

## 2019-02-22 MED ORDER — OXYCODONE HCL 5 MG/5ML PO SOLN: 5.0000 mg | Freq: Once | ORAL | Status: DC | PRN

## 2019-02-22 MED ORDER — ONDANSETRON HCL 4 MG/2ML IJ SOLN
INTRAMUSCULAR | Status: DC | PRN
  Administered 2019-02-22: 4 mg via INTRAVENOUS

## 2019-02-22 MED ORDER — IOHEXOL 300 MG/ML  SOLN
INTRAMUSCULAR | Status: DC | PRN
  Administered 2019-02-22: 5 mL

## 2019-02-22 SURGICAL SUPPLY — 79 items
APPLICATOR COTTON TIP 6 STRL (MISCELLANEOUS) IMPLANT
APPLICATOR COTTON TIP 6IN STRL (MISCELLANEOUS)
APPLIER CLIP 5 13 M/L LIGAMAX5 (MISCELLANEOUS)
APPLIER CLIP ROT 10 11.4 M/L (STAPLE)
APPLIER CLIP ROT 13.4 12 LRG (CLIP)
BLADE SURG 15 STRL LF DISP TIS (BLADE) ×1 IMPLANT
BLADE SURG 15 STRL SS (BLADE) ×1
CABLE HIGH FREQUENCY MONO STRZ (ELECTRODE) ×2 IMPLANT
CHLORAPREP W/TINT 26 (MISCELLANEOUS) ×2 IMPLANT
CHOLANGIOGRAM CATH TAUT (CATHETERS) ×2 IMPLANT
CLIP APPLIE 5 13 M/L LIGAMAX5 (MISCELLANEOUS) IMPLANT
CLIP APPLIE ROT 10 11.4 M/L (STAPLE) IMPLANT
CLIP APPLIE ROT 13.4 12 LRG (CLIP) IMPLANT
COVER MAYO STAND STRL (DRAPES) ×2 IMPLANT
COVER SURGICAL LIGHT HANDLE (MISCELLANEOUS) ×2 IMPLANT
COVER WAND RF STERILE (DRAPES) IMPLANT
DECANTER SPIKE VIAL GLASS SM (MISCELLANEOUS) ×2 IMPLANT
DERMABOND ADVANCED (GAUZE/BANDAGES/DRESSINGS) ×1
DERMABOND ADVANCED .7 DNX12 (GAUZE/BANDAGES/DRESSINGS) ×1 IMPLANT
DEVICE SUT QUICK LOAD TK 5 (STAPLE) IMPLANT
DEVICE SUT TI-KNOT TK 5X26 (MISCELLANEOUS) IMPLANT
DEVICE SUTURE ENDOST 10MM (ENDOMECHANICALS) IMPLANT
DISSECTOR BLUNT TIP ENDO 5MM (MISCELLANEOUS) IMPLANT
DRAPE C-ARM 42X120 X-RAY (DRAPES) ×2 IMPLANT
DRAPE UTILITY XL STRL (DRAPES) ×4 IMPLANT
ELECT REM PT RETURN 15FT ADLT (MISCELLANEOUS) ×2 IMPLANT
GLOVE SURG SYN 7.5  E (GLOVE) ×1
GLOVE SURG SYN 7.5 E (GLOVE) ×1 IMPLANT
GLOVE SURG SYN 7.5 PF PI (GLOVE) ×1 IMPLANT
GOWN STRL REUS W/TWL XL LVL3 (GOWN DISPOSABLE) ×6 IMPLANT
GRASPER SUT TROCAR 14GX15 (MISCELLANEOUS) IMPLANT
HEMOSTAT SURGICEL 4X8 (HEMOSTASIS) IMPLANT
HOVERMATT SINGLE USE (MISCELLANEOUS) ×2 IMPLANT
IV CATH 14GX2 1/4 (CATHETERS) ×2 IMPLANT
IV SET EXTENSION CATH 6 NF (IV SETS) ×2 IMPLANT
KIT BASIN OR (CUSTOM PROCEDURE TRAY) ×2 IMPLANT
KIT TURNOVER KIT A (KITS) IMPLANT
MARKER SKIN DUAL TIP RULER LAB (MISCELLANEOUS) ×2 IMPLANT
NDL SPNL 22GX3.5 QUINCKE BK (NEEDLE) ×1 IMPLANT
NEEDLE SPNL 22GX3.5 QUINCKE BK (NEEDLE) ×2 IMPLANT
PACK UNIVERSAL I (CUSTOM PROCEDURE TRAY) ×2 IMPLANT
POUCH RETRIEVAL ECOSAC 10 (ENDOMECHANICALS) ×1 IMPLANT
POUCH RETRIEVAL ECOSAC 10MM (ENDOMECHANICALS) ×1
RELOAD STAPLE 60 3.6 BLU REG (STAPLE) IMPLANT
RELOAD STAPLE 60 3.8 GOLD REG (STAPLE) IMPLANT
RELOAD STAPLE 60 4.1 GRN THCK (STAPLE) ×2 IMPLANT
RELOAD STAPLER BLUE 60MM (STAPLE) ×2 IMPLANT
RELOAD STAPLER GOLD 60MM (STAPLE) ×2 IMPLANT
RELOAD STAPLER GREEN 60MM (STAPLE) ×2 IMPLANT
SCISSORS LAP 5X35 DISP (ENDOMECHANICALS) ×2 IMPLANT
SEALANT SURGICAL APPL DUAL CAN (MISCELLANEOUS) ×2 IMPLANT
SET IRRIG TUBING LAPAROSCOPIC (IRRIGATION / IRRIGATOR) ×2 IMPLANT
SET TUBE SMOKE EVAC HIGH FLOW (TUBING) ×2 IMPLANT
SHEARS HARMONIC ACE PLUS 45CM (MISCELLANEOUS) ×2 IMPLANT
SLEEVE ADV FIXATION 5X100MM (TROCAR) ×5 IMPLANT
SLEEVE GASTRECTOMY 36FR VISIGI (MISCELLANEOUS) ×2 IMPLANT
SOLUTION ANTI FOG 6CC (MISCELLANEOUS) ×2 IMPLANT
SPONGE LAP 18X18 RF (DISPOSABLE) ×2 IMPLANT
STAPLER ECHELON LONG 60 440 (INSTRUMENTS) ×2 IMPLANT
STAPLER RELOAD BLUE 60MM (STAPLE) ×4
STAPLER RELOAD GOLD 60MM (STAPLE) ×4
STAPLER RELOAD GREEN 60MM (STAPLE) ×4
STOPCOCK 4 WAY LG BORE MALE ST (IV SETS) ×2 IMPLANT
STRIP CLOSURE SKIN 1/4X4 (GAUZE/BANDAGES/DRESSINGS) IMPLANT
SUT MNCRL AB 4-0 PS2 18 (SUTURE) ×3 IMPLANT
SUT SURGIDAC NAB ES-9 0 48 120 (SUTURE) IMPLANT
SUT VICRYL 0 TIES 12 18 (SUTURE) IMPLANT
SYR 10ML ECCENTRIC (SYRINGE) ×2 IMPLANT
SYR CONTROL 10ML LL (SYRINGE) ×2 IMPLANT
TOWEL OR 17X26 10 PK STRL BLUE (TOWEL DISPOSABLE) ×2 IMPLANT
TOWEL OR NON WOVEN STRL DISP B (DISPOSABLE) ×2 IMPLANT
TRAY LAPAROSCOPIC (CUSTOM PROCEDURE TRAY) ×2 IMPLANT
TROCAR ADV FIXATION 11X100MM (TROCAR) IMPLANT
TROCAR ADV FIXATION 12X100MM (TROCAR) ×2 IMPLANT
TROCAR ADV FIXATION 5X100MM (TROCAR) ×2 IMPLANT
TROCAR BLADELESS 15MM (ENDOMECHANICALS) ×2 IMPLANT
TROCAR BLADELESS OPT 5 100 (ENDOMECHANICALS) ×2 IMPLANT
TROCAR XCEL BLUNT TIP 100MML (ENDOMECHANICALS) ×2 IMPLANT
TUBING CONNECTING 10 (TUBING) ×2 IMPLANT

## 2019-02-22 NOTE — Op Note (Signed)
Lisa Le:   Natalia Devers DOB:   12/01/1980 MRN:   [id number]  DATE OF PROCEDURE: 02/22/2019                   [facility]  OPERATIVE REPORT  PREOPERATIVE DIAGNOSIS:  Morbid obesity, chronic cholecystitis, cholelthiasis  POSTOPERATIVE DIAGNOSIS:  Morbid obesity (weight 279, BMI of 43.7), chronic cholecystitis, cholelthiasis  PROCEDURE:  Laparoscopic sleeve gastrectomy (intraoperative upper endoscopy by Dr. Daphine DeutscherMartin), laparoscopic cholecystectomy with IOC  SURGEON:  Sandria Balesavid H. Ezzard StandingNewman, MD  FIRST ASSISTANSheron Nightingale:  M. Martin, M.D.  ANESTHESIA:  General endotracheal.  Anesthesiologist: Beryle LatheBrock, Thomas E, MD CRNA: Sindy GuadeloupeGood, Melanie B, CRNA; Orest DikesPeters, Laura J, CRNA  General  ESTIMATED BLOOD LOSS:  Minimal.  LOCAL ANESTHESIA:  30 cc of 1/4% Marcaine and 20 cc of Exparel  COMPLICATIONS:  None.  INDICATION FOR SURGERY:  Lisa GrahamKarima Weintraub is a 38 y.o. female who sees Sagardia, Eilleen KempfMiguel Jose, MD as her primary care doctor.  She has completed our preoperative bariatric program and now comes for a laparoscopic sleeve gastrectomy.  She also has symptomatic cholelithiasis and I will do a laparoscopic cholecystectomy at the same time.  The indications, potential complications of surgery were explained to the patient.  Potential complications of the surgery include, but are not limited to, bleeding, infection, DVT, open surgery, and long-term nutritional consequences.  OPERATIVE NOTE:  [facility] Ms. Lund underwent a general endotracheal anesthetic, supervised by Anesthesiologist: Beryle LatheBrock, Thomas E, MD CRNA: Sindy GuadeloupeGood, Melanie B, CRNA; Orest DikesPeters, Laura J, CRNA.  The patient was given 2 g of cefotetan at the beginning of the procedure.  A time-out was held and surgical checklist run.  I accessed her abdominal cavity through the left upper quadrant with a 5 mm Optiview. I did an abdominal exploration.   Her omentum and bowel were unremarkable. The right and left lobes of the liver  unremarkable. Gallbladder was normal. Her stomach was unremarkable.   I placed a total of 7 trocars. I placed a 5 mm left lateral trocar, a 5 mm left paramedian trocar (for the scope), a 12 mm right paramedian torcar, a 5 mm right subcostal trocar, a 5 mm trocar in the right lateral subcostal position for retraction of the gall bladder, and 5 mm subxiphoid trocar for the liver retractor.  I upsized the subxiphoid trocar to a 15 mm to extract the stomach and gall bladder..  I placed a abdominal wall anesthetic block using a mixture of 1/4% Marcaine and Exparel.  I used 20 cc per side, for a total of 40 cc.  I started out taking down the greater curvature attachments of the stomach. I measured approximately 6 cm proximal from the pylorus and mobilized the greater curvature of the stomach with the Harmonic Scalpel. I took this dissection cranially around the greater curvature of her stomach to the angle of His and the left crus.   After I had mobilized the greater curvature of the stomach, I then passed the 36 French ViSiGi bougie which was used to suck the stomach up against the lesser curvature and placed into the antrum. During the staple firing,  I tried to give the ViSiGi a cuff at least about 1 cm. I tried to avoid narrowing the incisura. I used a total of 6 staple firings.  From antrum to the angle of His, I used 2 green, 2 gold and 2 blue Eschelon 60 mm Ethicon staplers. I did not use staple line reinforcement.  At each firing of the EndoGIA stapler, I inspected the stomach, anterior wall of the stomach, and underneath to make sure there was no compromise or impingement on to the ViSiGi bougie.   The staple line seemed linear without any corkscrewing of the stomach. Hemostasis was good. I did not use any reinforcement. She did have at least 4 areas of bleeding which I used clips to clip on the new greater curvature of the stomach.  Because I thought we had a good staple line, I then had the ViSiGi  was converted to insufflate the pouch. The new stomach pouch was placed under water. There was no bubbling or leak noted.   At this point, Dr. Hassell Done broke scrub and passed an upper endoscope down through the esophagus into the stomach pouch. The stomach was tubular. There was no narrowing of the stomach pouch or angulation. We were easily able to pass the endoscope into the antrum and again put air pressure on the staple line. I irrigated the upper abdomen with saline. There was no bubbling or evidence of air leak. The mucosa looked viable. Dr. Hassell Done decompressed the stomach with the endoscopy.   I converted the subxiphoidl trocar to a 15 trocar and extracted the stomach remnant through this intact and sent this to pathology. I then placed 10 cc of Tisseel along the new greater curvature staple line and covered the entire staple line with the Tisseel.  I aspirated out the saline that I had irrigated because I thought the staple line looked viable and complete. There was no evidence of leak. I did not leave a drain in place.   I then started the cholecystectomy.  The gall bladder was whitish and had thin omentum attached to it, consistent with chronic cholecystitis.   I grasped the gall bladder and rotated it cephalad.  Disssection was carried down to the gall bladder/cystic duct junction and the cystic duct isolated.  A clip was placed on the gall bladder side of the cystic duct.   An intra-operative cholangiogram was shot.  The intra-operative cholangiogram was shot using a cut off Taut catheter placed through a 14 gauge angiocath in the RUQ.  The Taut catheter was inserted in the cut cystic duct and secured with an endoclip.  A cholangiogram was shot with 9 cc of 1/2 strength Isoview.  Using fluoroscopy, the cholangiogram showed the flow of contrast into the common bile duct, up the hepatic radicals, and into the duodenum.  There was no mass or obstruction.  This was a normal intra-operative  cholangiogram.  The Taut catheter was removed.  The cystic duct was tripley endoclipped and the cystic artery was identified and clipped.  The gall bladder was bluntly and sharpley dissected from the gall bladder bed.  After the gall bladder was removed from the liver, the gall bladder bed and Triangle of Calot were inspected.  There was no bleeding or bile leak.  The gall bladder had one large stone, at least 3.5 cm in size.  The gall bladder was placed in a Ecco Sac bag and delivered through the subxiphoid incision.  The abdomen was irrigated with 2,000 cc saline.  I have a surgeon as a first assist to retract, expose, and assist on this difficult operation.  I removed the trocars and closed the skin at each site with a 4-0 Monocryl, painted each wound with Dermabond.   The patient was transported to recovery room in good condition. Sponge and needle count were correct at the  end of the case.    Ovidio Kinavid Amarylis Rovito, MD, [facility] Pager: [phone] Office phone:  [phone]

## 2019-02-22 NOTE — Anesthesia Postprocedure Evaluation (Signed)
Anesthesia Post Note  Patient: Katerina Maher  Procedure(s) Performed: LAPAROSCOPIC GASTRIC SLEEVE RESECTION (N/A Abdomen) LAPAROSCOPIC CHOLECYSTECTOMY WITH INTRAOPERATIVE CHOLANGIOGRAM (N/A Abdomen)     Patient location during evaluation: PACU Anesthesia Type: General Level of consciousness: awake and alert Pain management: pain level controlled Vital Signs Assessment: post-procedure vital signs reviewed and stable Respiratory status: spontaneous breathing, nonlabored ventilation and respiratory function stable Cardiovascular status: blood pressure returned to baseline and stable Postop Assessment: no apparent nausea or vomiting Anesthetic complications: no    Last Vitals:  Vitals:   02/22/19 1115 02/22/19 1140  BP: (!) 143/103 (!) 137/100  Pulse: 85 73  Resp: 14 14  Temp: (!) 36.3 C 36.6 C  SpO2: 95% 98%    Last Pain:  Vitals:   02/22/19 1140  TempSrc: Oral  PainSc: 0-No pain                 Audry Pili

## 2019-02-22 NOTE — Anesthesia Procedure Notes (Signed)
Procedure Name: Intubation Date/Time: 02/22/2019 7:27 AM Performed by: Eben Burow, CRNA Pre-anesthesia Checklist: Patient identified, Emergency Drugs available, Suction available, Patient being monitored and Timeout performed Patient Re-evaluated:Patient Re-evaluated prior to induction Oxygen Delivery Method: Circle system utilized Preoxygenation: Pre-oxygenation with 100% oxygen Induction Type: IV induction Ventilation: Mask ventilation without difficulty Laryngoscope Size: Mac and 4 Grade View: Grade II Tube type: Oral Tube size: 7.0 mm Number of attempts: 1 Airway Equipment and Method: Stylet Placement Confirmation: ETT inserted through vocal cords under direct vision,  positive ETCO2 and breath sounds checked- equal and bilateral Secured at: 22 cm Tube secured with: Tape Dental Injury: Teeth and Oropharynx as per pre-operative assessment

## 2019-02-22 NOTE — Op Note (Signed)
Lisa Le [id number] [birth year] 02/22/2019  Preoperative diagnosis: sleeve gastrectomy in progress  Postoperative diagnosis: Same   Procedure: Upper endoscopy   Surgeon: Catalina Antigua B. Hassell Done  M.D., FACS   Anesthesia: Gen.   Indications for procedure: This patient was undergoing a sleeve gastrectomy by Dr. Lucia Gaskins.  Endoscopy to .    Description of procedure: The endoscopy was placed in the mouth and into the oropharynx and under endoscopic vision it was advanced to the esophagogastric junction.  The pouch was insufflated and the scope passed to the antrum and the pylorus was identified.  The EG junction was at ~38 cm.   No bleeding or leaks were detected.  The scope was withdrawn without difficulty.     Matt B. Hassell Done, MD, FACS General, Bariatric, & Minimally Invasive Surgery [facility], [place]

## 2019-02-22 NOTE — Progress Notes (Signed)
PHARMACY CONSULT FOR:  Risk Assessment for Post-Discharge VTE Following Bariatric Surgery  Post-Discharge VTE Risk Assessment: This patient's probability of 30-day post-discharge VTE is increased due to the factors marked:   Female    Age >/=60 years    BMI >/=50 kg/m2    CHF    Dyspnea at Rest    Paraplegia  X  Non-gastric-band surgery    Operation Time >/=3 hr    Return to OR     Length of Stay >/= 3 d   Predicted probability of 30-day post-discharge VTE: 0.16%  Other patient-specific factors to consider: n/a   Recommendation for Discharge: . No pharmacologic prophylaxis post-discharge . Follow daily and recalculate estimated 30d VTE risk if returns to OR or LOS reaches 3 days.   Lisa Le is a 38 y.o. female who underwent  laparoscopic sleeve gastrectomy AND cholecystectomy on 7/20   Case start: 0744 Case end: 1007   Allergies  Allergen Reactions  . Latex Swelling    SWELLING LIPS    Patient Measurements: Height: 5\' 7"  (170.2 cm) Weight: 270 lb (122.5 kg) IBW/kg (Calculated) : 61.6 Body mass index is 42.29 kg/m.  No results for input(s): WBC, HGB, HCT, PLT, APTT, CREATININE, LABCREA, CREATININE, CREAT24HRUR, MG, PHOS, ALBUMIN, PROT, ALBUMIN, AST, ALT, ALKPHOS, BILITOT, BILIDIR, IBILI in the last 72 hours. Estimated Creatinine Clearance: 128.2 mL/min (by C-G formula based on SCr of 0.63 mg/dL).  Past Medical History:  Diagnosis Date  . Complication of anesthesia    unable to urinate after ORIF ankle, had to insert Foley; had constipation x 1 week  . Dental crown present    tooth #9  . Post-traumatic arthritis of ankle, left 07/2015  . Retained orthopedic hardware 07/2016   left ankle  . Tension headache     Medications Prior to Admission  Medication Sig Dispense Refill Last Dose  . acetaminophen (TYLENOL) 325 MG tablet Take 650 mg by mouth every 6 (six) hours as needed.   Past Month at Unknown time  . calcium citrate-vitamin D (CALCIUM CITRATE CHEWY  BITE) 500-500 MG-UNIT chewable tablet Chew 1 tablet by mouth 3 (three) times daily.   02/21/2019 at Unknown time  . ibuprofen (ADVIL) 200 MG tablet Take 600 mg by mouth every 8 (eight) hours as needed (pain.).     Marland Kitchen Multiple Vitamins-Minerals (BARIATRIC MULTIVITAMINS/IRON PO) Take 1 tablet by mouth 2 (two) times a day.   02/21/2019 at Unknown time  . ondansetron (ZOFRAN-ODT) 4 MG disintegrating tablet Take 4 mg by mouth every 6 (six) hours as needed for nausea/vomiting.     . pantoprazole (PROTONIX) 40 MG tablet Take 40 mg by mouth daily.       Reuel Boom, PharmD, BCPS [phone] 02/22/2019, 3:47 PM

## 2019-02-22 NOTE — Transfer of Care (Signed)
Immediate Anesthesia Transfer of Care Note  Patient: Lisa Le  Procedure(s) Performed: LAPAROSCOPIC GASTRIC SLEEVE RESECTION (N/A Abdomen) LAPAROSCOPIC CHOLECYSTECTOMY WITH INTRAOPERATIVE CHOLANGIOGRAM (N/A Abdomen)  Patient Location: PACU  Anesthesia Type:General  Level of Consciousness: drowsy  Airway & Oxygen Therapy: Patient Spontanous Breathing and Patient connected to face mask oxygen  Post-op Assessment: Report given to RN and Post -op Vital signs reviewed and stable  Post vital signs: Reviewed and stable  Last Vitals:  Vitals Value Taken Time  BP    Temp    Pulse    Resp    SpO2      Last Pain:  Vitals:   02/22/19 0540  TempSrc: Oral         Complications: No apparent anesthesia complications

## 2019-02-22 NOTE — Interval H&P Note (Signed)
History and Physical Interval Note:  02/22/2019 7:07 AM  Lisa Le  has presented today for surgery, with the diagnosis of Morbid Obesity, NASH, Gallstones, RUQ Pain.   The various methods of treatment have been discussed with the patient and family.   Her contact after surgery is her daughter, [name].  After consideration of risks, benefits and other options for treatment, the patient has consented to  Procedure(s): LAPAROSCOPIC GASTRIC SLEEVE RESECTION (N/A) LAPAROSCOPIC CHOLECYSTECTOMY WITH INTRAOPERATIVE CHOLANGIOGRAM (N/A) as a surgical intervention.  The patient's history has been reviewed, patient examined, no change in status, stable for surgery.  I have reviewed the patient's chart and labs.  Questions were answered to the patient's satisfaction.     Shann Medal

## 2019-02-22 NOTE — Progress Notes (Signed)
Unable to start waters as ordered, patient very lethargic

## 2019-02-23 ENCOUNTER — Encounter (HOSPITAL_COMMUNITY): Payer: Self-pay | Admitting: Surgery

## 2019-02-23 LAB — CBC WITH DIFFERENTIAL/PLATELET
Abs Immature Granulocytes: 0.04 10*3/uL (ref 0.00–0.07)
Basophils Absolute: 0 10*3/uL (ref 0.0–0.1)
Basophils Relative: 0 %
Eosinophils Absolute: 0 10*3/uL (ref 0.0–0.5)
Eosinophils Relative: 0 %
HCT: 38 % (ref 36.0–46.0)
Hemoglobin: 12 g/dL (ref 12.0–15.0)
Immature Granulocytes: 0 %
Lymphocytes Relative: 13 %
Lymphs Abs: 1.3 10*3/uL (ref 0.7–4.0)
MCH: 26.8 pg (ref 26.0–34.0)
MCHC: 31.6 g/dL (ref 30.0–36.0)
MCV: 85 fL (ref 80.0–100.0)
Monocytes Absolute: 0.9 10*3/uL (ref 0.1–1.0)
Monocytes Relative: 9 %
Neutro Abs: 7.3 10*3/uL (ref 1.7–7.7)
Neutrophils Relative %: 78 %
Platelets: 267 10*3/uL (ref 150–400)
RBC: 4.47 MIL/uL (ref 3.87–5.11)
RDW: 13.8 % (ref 11.5–15.5)
WBC: 9.5 10*3/uL (ref 4.0–10.5)
nRBC: 0 % (ref 0.0–0.2)

## 2019-02-23 MED ORDER — TRAMADOL HCL 50 MG PO TABS: 50.0000 mg | ORAL_TABLET | Freq: Four times a day (QID) | ORAL | 0 refills | Status: DC | PRN

## 2019-02-23 MED FILL — traMADol HCL 50 MG TABS: 50 | 3 days supply | Qty: 10 | Fill #0

## 2019-02-23 NOTE — Progress Notes (Signed)
Nutrition Note  RD consulted for post-op diet education following bariatric surgery. While RDs are working remotely, Bariatric Nurse Coordinator providing diet education at this time.  If nutrition issues arise, please consult RD.   Britanni Yarde, MS, RD, LDN [facility] Clinical Dietitian Pager: [phone] After Hours Pager: [phone] 

## 2019-02-23 NOTE — Progress Notes (Signed)
Patient alert and oriented, pain is controlled. Patient is tolerating fluids, advanced to protein shake today, patient is tolerating well.  Reviewed Gastric sleeve discharge instructions with patient and patient is able to articulate understanding.  Provided information on BELT program, Support Group and [facility] outpatient pharmacy. All questions answered.  IV dc. Pt is ready for discharge.

## 2019-02-23 NOTE — Discharge Summary (Signed)
Physician Discharge Summary  Patient ID:  Lisa GrahamKarima Le  MRN: [id number]  DOB/AGE: 39/26/1982 39 y.o.  Admit date: 02/22/2019 Discharge date: 02/23/2019  Discharge Diagnoses:  1.  MORBID OBESITY (E66.01)             langiogram - to be scheduled  2.  GALLSTONES (K80.20)  Chronic cholecystitis 3. Left trimaleolar fx - 12/27/2014 Lisa Le             Left ankle surgery - 07/25/2016 - Lisa Le      Active Problems:   Morbid obesity (HCC)   Operation: Procedure(s): LAPAROSCOPIC GASTRIC SLEEVE RESECTION,  LAPAROSCOPIC CHOLECYSTECTOMY WITH INTRAOPERATIVE CHOLANGIOGRAM on 02/22/2019 - Lisa Le  Discharged Condition: good  Hospital Course: Lisa Le is an 38 y.o. female whose primary care physician is Lisa Le and who was admitted 02/22/2019 with a chief complaint of morbid obesity and symptomatic gall bladder disease.Marland Kitchen.   She was brought to the operating room on 02/22/2019 and underwent LAPAROSCOPIC GASTRIC SLEEVE RESECTION,  LAPAROSCOPIC CHOLECYSTECTOMY WITH INTRAOPERATIVE CHOLANGIOGRAM.  She is now one day post op. She is doing well, she is keeping her liquids down, though she says the protein drinks are a little heavy. She is ready for discharge.  The discharge instructions were reviewed with the patient.  Consults: None  Significant Diagnostic Studies: Results for orders placed or performed during the hospital encounter of 02/22/19  Pregnancy, urine STAT morning of surgery  Result Value Ref Range   Preg Test, Ur NEGATIVE NEGATIVE  CBC WITH DIFFERENTIAL  Result Value Ref Range   WBC 9.5 4.0 - 10.5 K/uL   RBC 4.47 3.87 - 5.11 MIL/uL   Hemoglobin 12.0 12.0 - 15.0 g/dL   HCT 72.538.0 36.636.0 - 44.046.0 %   MCV 85.0 80.0 - 100.0 fL   MCH 26.8 26.0 - 34.0 pg   MCHC 31.6 30.0 - 36.0 g/dL   RDW 34.713.8 42.511.5 - 95.615.5 %   Platelets 267 150 - 400 K/uL   nRBC 0.0 0.0 - 0.2 %   Neutrophils Relative % 78 %   Neutro Abs 7.3 1.7 - 7.7 K/uL   Lymphocytes Relative 13 %   Lymphs  Abs 1.3 0.7 - 4.0 K/uL   Monocytes Relative 9 %   Monocytes Absolute 0.9 0.1 - 1.0 K/uL   Eosinophils Relative 0 %   Eosinophils Absolute 0.0 0.0 - 0.5 K/uL   Basophils Relative 0 %   Basophils Absolute 0.0 0.0 - 0.1 K/uL   Immature Granulocytes 0 %   Abs Immature Granulocytes 0.04 0.00 - 0.07 K/uL  Hemoglobin and hematocrit, blood  Result Value Ref Range   Hemoglobin 12.5 12.0 - 15.0 g/dL   HCT 38.739.2 56.436.0 - 33.246.0 %    Dg Cholangiogram Operative  Result Date: 02/22/2019 CLINICAL DATA:  Cholelithiasis EXAM: INTRAOPERATIVE CHOLANGIOGRAM TECHNIQUE: Cholangiographic images from the C-arm fluoroscopic device were submitted for interpretation post-operatively. Please see the procedural report for the amount of contrast and the fluoroscopy time utilized. COMPARISON:  Ultrasound 04/30/2018 FINDINGS: No persistent filling defects in the common duct. Intrahepatic ducts are incompletely visualized, appearing decompressed centrally. Contrast passes into the duodenum. : Negative for retained common duct stone. Electronically Signed   By: Lisa Le M.D.   On: 02/22/2019 09:54    Discharge Exam:  Vitals:   02/23/19 0505 02/23/19 0846  BP: 129/90 (!) 136/95  Pulse: 88 81  Resp: 16 17  Temp: 98.5 F (36.9 C) 98.2 F (36.8 C)  SpO2: 96% 95%  General: Obese F who is alert and generally healthy appearing.  Lungs: Clear to auscultation and symmetric breath sounds. Heart:  RRR. No murmur or rub. Abdomen: Soft. No mass. No tenderness. No hernia. Normal bowel sounds.   Discharge Medications:   Allergies as of 02/23/2019      Reactions   Latex Swelling   SWELLING LIPS      Medication List    TAKE these medications   acetaminophen 325 MG tablet Commonly known as: TYLENOL Take 650 mg by mouth every 6 (six) hours as needed.   BARIATRIC MULTIVITAMINS/IRON PO Take 1 tablet by mouth 2 (two) times a day.   Calcium Citrate Chewy Bite 500-500 MG-UNIT chewable tablet Generic drug: calcium  citrate-vitamin D Chew 1 tablet by mouth 3 (three) times daily.   ibuprofen 200 MG tablet Commonly known as: ADVIL Take 600 mg by mouth every 8 (eight) hours as needed (pain.).   ondansetron 4 MG disintegrating tablet Commonly known as: ZOFRAN-ODT Take 4 mg by mouth every 6 (six) hours as needed for nausea/vomiting.   pantoprazole 40 MG tablet Commonly known as: PROTONIX Take 40 mg by mouth daily.   traMADol 50 MG tablet Commonly known as: ULTRAM Take 1 tablet (50 mg total) by mouth every 6 (six) hours as needed (pain).       Disposition: Discharge disposition: 01-Home or Self Care       Discharge Instructions    Ambulate hourly while awake   Complete by: As directed    Call Le for:  difficulty breathing, headache or visual disturbances   Complete by: As directed    Call Le for:  persistant dizziness or light-headedness   Complete by: As directed    Call Le for:  persistant nausea and vomiting   Complete by: As directed    Call Le for:  redness, tenderness, or signs of infection (pain, swelling, redness, odor or green/yellow discharge around incision site)   Complete by: As directed    Call Le for:  severe uncontrolled pain   Complete by: As directed    Call Le for:  temperature >101 F   Complete by: As directed    Diet bariatric full liquid   Complete by: As directed    Incentive spirometry   Complete by: As directed    Perform hourly while awake      Signed: Alphonsa Overall, M.D., [facility] Office:  [phone]  02/23/2019, 1:02 PM

## 2019-02-23 NOTE — Discharge Instructions (Signed)
Bariatric Surgery Information Bariatric surgery, also called weight loss surgery, is a procedure that helps you lose weight. You may consider, or your health care provider may suggest, bariatric surgery if:  You are severely obese and have been unable to lose weight through diet and exercise.  You have health problems related to obesity, such as: ? Type 2 diabetes. ? Heart disease. ? Lung disease. How does bariatric surgery help me lose weight? Bariatric surgery helps you lose weight by:  Decreasing how much food your body absorbs. This is done by closing off part of your stomach to make it smaller. This restricts the amount of food your stomach can hold.  Changing your body's regular digestive process so that food bypasses the parts of your body that absorb calories and nutrients. If you decide to have bariatric surgery, it is important to continue to eat a healthy diet and exercise regularly after the surgery. What are the different kinds of bariatric surgery? There are two kinds of bariatric surgeries:  Restrictive surgery. This procedure makes your stomach smaller. It does not change your digestive process. The smaller the size of your new stomach, the less food you can eat. There are different types of restrictive surgeries.  Malabsorptive surgery. This procedure makes your stomach smaller and alters your digestive process so that your body processes less calories and nutrients. These are the most common kind of bariatric surgery. There are different types of malabsorptive surgeries. What are the different types of restrictive surgery? Adjustable Gastric Banding In this procedure, an inflatable band is placed around your stomach near the upper end. This makes the passageway for food into the rest of your stomach much smaller. The band can be adjusted, making it tighter or looser, by filling it with salt solution. Your surgeon can adjust the band based on how you are feeling and how  much weight you are losing. The band can be removed in the future. This requires another surgery. Sleeve Gastrectomy In this procedure, your stomach is made smaller. This is done by surgically removing a large part of your stomach. When your stomach is smaller, you feel full more quickly and reduce how much you eat. What are the different types of malabsorptive surgery?  Roux-en-Y Gastric Bypass (RGB) This is the most common weight loss surgery. In this procedure, a small stomach pouch (gastric pouch) is created in the upper part of your stomach. Next, this gastric pouch is attached directly to the middle part of your small intestine. The farther down your small intestine the new connection is made, the fewer calories and nutrients you will absorb. This surgery has the highest rate of complications. Biliopancreatic Diversion with Duodenal Switch (BPD/DS) This is a multi-step procedure. First, a large part of your stomach is removed, making your stomach smaller. Next, this smaller stomach is attached to the lower part of your small intestine. Like the RGB surgery, you absorb fewer calories and nutrients the farther down your small intestine the attachment is made. What are the risks of bariatric surgery? As with any surgical procedure, each type of bariatric surgery has its own risks. These risks also depend on your age, your overall health, and any other medical conditions you may have. When deciding on bariatric surgery, it is very important to:  Talk to your health care provider and choose the surgery that is best for you.  Ask your health care provider about specific risks for the surgery you choose. Generally, the risks of bariatric surgery  include:  Infection.  Bleeding.  Not getting enough nutrients from food (nutritional deficiencies).  Failure of the device or procedure. This may require another surgery to correct the problem. Where to find more information  American Society for  Metabolic & Bariatric Surgery: www.asmbs.org  Weight-control Information Network (WIN): win.StageSync.siniddk.nih.gov Summary  Bariatric surgery, also called weight loss surgery, is a procedure that helps you lose weight.  This surgery may be recommended if you have diabetes, heart disease, or lung disease.  Generally, risks of bariatric surgery include infection, bleeding, and failure of the surgery or device, which may require another surgery to correct the problem. This information is not intended to replace advice given to you by your health care provider. Make sure you discuss any questions you have with your health care provider. Document Released: 07/22/2005 Document Revised: 11/10/2018 Document Reviewed: 08/26/2016 Elsevier Patient Education  2020 Elsevier Inc.   Eating Plan After Bariatric Surgery, Stage 1 A diet after weight loss surgery (bariatric surgery) should provide plenty of fluids and nutrients while promoting weight loss and healing. Each stage of recovery has a different set of food and drink recommendations. Your health care provider may recommend that you work with a diet and nutrition specialist (dietitian) to make a staged eating plan that is right for you. Stage 1 begins right after surgery and lasts until about 2 weeks after surgery, or as long as directed by your health care provider. During this time, you will eat a liquid-only diet. You will be on clear liquids immediately after surgery. After your health care provider approves, you will move on to full liquids. What are tips for following this plan?  Meal planning  Eat at set times. Allow 30-45 minutes for each meal.  Have protein with every meal. Eat protein foods first. Protein is a very important nutrient after surgery.  You will need at least 60-80 g of protein daily, or as much as determined by your dietitian. Work with your dietitian to choose a liquid protein supplement that has: ? At least 15 g of protein per 8  oz serving. ? Less than 20 g total carbohydrate per 8 oz serving. ? Less than 5 g fat per 8 oz serving.  To get more protein, you may add 1 Tbsp non-fat dry milk powder to each  cup of skim milk.  Do not eat or drink: ? Carbonated beverages. ? Sweets with more than 25 g of sugar per serving. ? High-fat foods. This includes foods with more than 5 g of fat per serving. ? Alcohol. ? Any foods you do not tolerate well, such as dairy or high-fiber foods.  Move on to a soft-food diet when your health care provider approves. For most people, this happens after 2 weeks on a liquid-only diet. General instructions  Sip liquids. Do not use a straw until several weeks after surgery.  Do not drink extra liquids with meals for 30 minutes before or after meals.  Slowly sip 8-10 oz of clear liquid, preferably water, between each meal. Try to get at least 48-60 oz of fluid each day.  Take a liquid or chewable multivitamin with iron each day. Discuss additional supplements with your health care provider or dietitian.  Stop eating when you feel full.  Your surgeon or dietitian may have specific eating guidelines for you. This may depend on: ? The type of weight loss surgery you had. ? Your overall health. Clear liquids It is important to drink clear liquids to make  sure you stay hydrated. Try to drink at least 24-30 oz (about 3 cups) of clear liquids each day. Clear liquids have:  No carbonation.  No sugar or calories.  No caffeine or alcohol. Clear liquids include:  Water and flavored water.  Decaffeinated coffee or tea.  "Light" powdered juice mixes, like lemonade.  Clear broth.  "Light" flavored gelatin.  Sugar-free popsicles. Full liquids Full liquids are important for making sure you have enough calories, protein, and other nutrients. Try to get at least 24-30 oz (about 3 cups) of full liquids each day. Full liquids include:  Low-fat or fat-free milk.  Soy  milk.  Yogurt.  Protein shakes or protein powder.  "Light" meal replacement shakes.  Sugar-free pudding. Summary  Stage 1 begins right after surgery and lasts until about 2 weeks after surgery, or as directed by your health care provider.  Stage 1 diet is a liquid-only diet. You will be on clear liquids immediately after surgery. After your health care provider approves, you will move to full liquids.  Eating enough protein and drinking plenty of fluids are important in promoting weight loss and healing after surgery. This information is not intended to replace advice given to you by your health care provider. Make sure you discuss any questions you have with your health care provider. Document Released: 01/26/2003 Document Revised: 11/12/2018 Document Reviewed: 10/21/2016 Elsevier Patient Education  [place] TO PATIENT  Activity:  Driving - May drive in 2 to 4 days, if doing well and off pain meds   Lifting - No lifting more than 15 pounds for 2 weeks.            Practice you Covid-19 protection:  Wear a mask, social distance, and wash your hands frequently  Wound Care:   Leave the incision dry until tomorrow, then you may shower  Diet:  Post bariatric surgery diet        Drink as much water/liquid as you can.  Follow up appointment:  Call Dr. Pollie Friar office [facility]) at [phone] for an appointment in 2 weeks.  Medications and dosages:  Resume your home medications.  You have a prescription for:  Ultram  Call Dr. Lucia Gaskins or his office  [phone]) if you have:  Temperature greater than 100.4,  Persistent nausea and vomiting,  Severe uncontrolled pain,  Redness, tenderness, or signs of infection (pain, swelling, redness, odor or green/yellow discharge around the site),  Any other questions or concerns you may have after discharge.  In an emergency, call 911 or go to an Emergency  Department at a nearby hospital.

## 2019-02-23 NOTE — Progress Notes (Signed)
Patient alert and oriented, Post op day 1.  Provided support and encouragement.  Encouraged pulmonary toilet, ambulation and small sips of liquids. Pt sitting in bedside chair and states that she is feeling much better today than yesterday.  Sipping on 1st cup of protein.  States she is belching frequently but not passing gas.   All questions answered.  Will continue to monitor.

## 2019-02-23 NOTE — Progress Notes (Signed)
Patient alert and oriented,  Provided support and encouragement.  Encouraged pulmonary toilet, ambulation and discussed pain management.  All questions answered.  Provided info on Phase 1 diet plan and postop info sheet. Pt sleepy and needs reinforcement. Will continue to monitor.

## 2019-02-23 NOTE — Progress Notes (Signed)
Notified Dr. Lucia Gaskins of pt's current B/P.  No new orders received.  Will instruct pt per Dr. Lucia Gaskins to f/u w/ PCP in 3-4 weeks and that [facility] will be checking her B/P in their office at f/u appt.

## 2019-02-25 ENCOUNTER — Other Ambulatory Visit: Payer: Self-pay | Admitting: *Deleted

## 2019-02-25 NOTE — Patient Outreach (Signed)
[facility]) Care Management  02/25/2019  Caral Whan May 03, 1981 [id number]  Transition of care call/case closure   Referral received: 7/22 Initial outreach: 7/23 Insurance : [employer]   Subjective: Initial successful outreach call to patient's preferred number in order to complete transition of care assessment;2 HIPAA identifiers verified. Explained purpose of call and completed transition of care assessment.  Patient states that she is doing wonderful, discussed surgical incisions without redness or drainage, she denies having elevated temperature ,  she discussed pain well controlled has used Tramadol once since discharge, no pain today. Patient discussed tolerated prescribed Bariatric full liquid diet , up to 52 ounces on yesterday, she denies having any nausea. She discussed having normal bowel movements and urinating without problems. Patient discussed tolerating shower on yesterday and having her daughter and  cousin to assist her in recovery. Patient discussed episode of dizziness after walking about 30 minutes in home on today, but feels fine now. She report tolerating use of incentive spirometry up to 2500.  She denies any ongoing health issues and does not need referral to one of [place] chronic disease management programs. Patient discussed that she has already made contact with [employer] for FMLA and plans to call regarding Short term verified that she has contact information.  Objective  Mrs.Lem was hospitalized at [facility] from 02/09/19-7/21 for Laparoscopic gastric sleeve resection, laparoscopic cholecystectomy. Comorbidites : Obesity  She was discharged on 7/21 without the need for home health services or DME.  Assessment Patient voices good understanding of all discharge instructions  See transition of care flowsheet for assessment details  Plan Reviewed hospital discharge diagnosis of Laparoscopic gastric sleeve resection and  laparoscopic chlolecystectomy and treatment plan using hospital discharge instructions, assessing medication adherence, reviewing postoperative problems requiring provider notification and discussing the importance of follow up with surgeon, primary care provider and specialist as directed.  Reviewed [place]'s Active Health Management 2020 Wellness Requirements of : Completing the computerized Health Assessment and the Health Action Step with Active Health Management [facility]) by April 06 2019 AND have an annual physical between August 05, 2017 and April 06, 2019 in order to qualify for the 2021 Healthy Lifestyle Premium.  Reviewed [place]'s chronic disease management program benefit with Active Health Management and encouraged to contact them at [phone] or at [email or web address] to enroll or for questions about managing his /her chronic disease states.    No ongoing care management needs identified so will close case to [place] Management  services and route successful outreach letter with [place] Management pamphlet and 24 Hour Nurse Line Magnet to [place] [facility] clinical pool to be mailed to patient's home address.   Joylene Draft, RN, [place] Management Coordinator  [phone]- Mobile [phone]- Toll Free Main Office

## 2019-03-01 ENCOUNTER — Telehealth (HOSPITAL_COMMUNITY): Payer: Self-pay

## 2019-03-01 NOTE — Telephone Encounter (Signed)
Patient called to discuss post bariatric surgery follow up questions.  Message left with contact information provided to answer below questions.  Await return call.  See below:   1.  Tell me about your pain and pain management?  2.  Let's talk about fluid intake.  How much total fluid are you taking in?  3.  How much protein have you taken in the last 2 days?  4.  Have you had nausea?  Tell me about when have experienced nausea and what you did to help?  5.  Has the frequency or color changed with your urine?  6.  Tell me what your incisions look like?  7.  Have you been passing gas? BM?  8.  If a problem or question were to arise who would you call?  Do you know contact numbers for [facility], [facility], and [facility]?  9.  How has the walking going?  10.  How are your vitamins and calcium going?  How are you taking them?

## 2019-03-09 ENCOUNTER — Encounter: Payer: 59 | Attending: Surgery | Admitting: Skilled Nursing Facility1

## 2019-03-09 ENCOUNTER — Other Ambulatory Visit: Payer: Self-pay

## 2019-03-09 DIAGNOSIS — E669 Obesity, unspecified: Secondary | ICD-10-CM | POA: Diagnosis not present

## 2019-03-10 NOTE — Progress Notes (Signed)
2 Week Post-Operative Nutrition Class   Patient was seen on 09/29/18 for Post-[facility]ative Nutrition [facility]    Surgery date: 02/22/2019 Surgery type: sleeve Start weight at [facility]: 284.9 Weight today: 259.5   Body Composition Scale 03/09/2019  Total Body Fat % 43.8  Visceral Fat 13  Fat-Free Mass % 56.1   Total Body Water % 42.5   Muscle-Mass lbs 34.9  Body Fat Displacement          Torso  lbs 70.6         Left Leg  lbs 14.1         Right Leg  lbs 14.1         Left Arm  lbs 7         Right Arm   lbs 7     The following the learning objectives were met by the patient during this course:  Identifies Phase 3 (Soft, High Proteins) Dietary Goals and will begin from 2 weeks post-operatively to 2 months post-operatively  Identifies appropriate sources of fluids and proteins   States protein recommendations and appropriate sources post-operatively  Identifies the need for appropriate texture modifications, mastication, and bite sizes when consuming solids  Identifies appropriate multivitamin and calcium sources post-operatively  Describes the need for physical activity post-operatively and will follow MD recommendations  States when to call healthcare provider regarding medication questions or post-operative complications   Handouts given during class include:  Phase 3A: Soft, High Protein Diet Handout   Follow-Up Plan: Patient will follow-up at [facility] in 6 weeks for 2 month post-op nutrition visit for diet advancement per MD.

## 2019-03-15 ENCOUNTER — Telehealth: Payer: Self-pay | Admitting: Skilled Nursing Facility1

## 2019-03-15 NOTE — Telephone Encounter (Signed)
RD called pt to verify fluid intake once starting soft, solid proteins 2 week post-bariatric surgery.   Daily Fluid intake: 64+ Daily Protein intake: 60+  Concerns/issues:   Veggie patty with hummus  No concerns   9000-13000 steps

## 2019-04-20 ENCOUNTER — Encounter: Payer: 59 | Attending: Surgery | Admitting: Skilled Nursing Facility1

## 2019-04-20 ENCOUNTER — Other Ambulatory Visit: Payer: Self-pay

## 2019-04-20 DIAGNOSIS — E669 Obesity, unspecified: Secondary | ICD-10-CM | POA: Insufficient documentation

## 2019-04-20 NOTE — Progress Notes (Signed)
Bariatric Follow-Up Visit 2 Months Medical Nutrition Therapy  Appt Start Time: 3:15 End Time: 3:46 Primary Concerns Today: Bariatric Surgery Nutrition Follow Up   Bariatric Surgery Type: sleeve  Surgery Date: 02/22/2019   NUTRITION ASSESSMENT   Anthropometrics  Start weight at [facility]: 284.9  lbs  Today's weight: 245.6  lbs Weight change:  lbs (since previous nutrition appointment)   Body Composition Results Body Composition Scale 04/20/2019  Total Body Fat % 42.4  Visceral Fat 12  Fat-Free Mass % 57.5   Total Body Water % 43.2   Muscle-Mass lbs 34.7  Body Fat Displacement          Torso  lbs 64.6         Left Leg  lbs 12.9         Right Leg  lbs 12.9         Left Arm  lbs 6.4         Right Arm   lbs 6.4     Psychosocial/Lifestyle Dx:   Pt state she is struggling to get in 5268 and that is the highest she can get. Pt state she has gone back to work. Pt states she has gotten some sectioned plates.   Pt states she has not been snoring and has a lot of energy.  Pt states she really loves dark chocolate.   Medications: See list Labs:  Supplements:  Multi and calcium  24-Hr Dietary Recall First Meal: 2 eggs and [place]urkey or protein shake if no appetite Snack:  Second Meal: chicken or veggie patty with black beans or hummus or lentils Snack:  Third Meal: chicken or veggie patty with black beans or hummus or lentils or protein shake if no appetite Snack:  Beverages: water   Estimated Daily Fluid Intake: 68 oz Estimated Daily Protein Intake: 60+ g   Physical Activity  Current average weekly physical activity: 30 minute walks at work and 60 minutes at home     Signs/Symptoms  Using straws: no Drinking while eating: no Chewing/swallowing difficulties: no Changes in vision: no Changes to mood/headaches: no Hair loss/changes to skin/nails: no Difficulty focusing/concentrating: no Sweating: no Dizziness/lightheadedness: no Palpitations: no Carbonated/caffeinated  beverages: no N/V/D/C/Gas: needing milk or magnesia or smooth move Abdominal pain: no Dumping syndrome: no     NUTRITION DIAGNOSIS  Overweight/obesity ([place]-3.3) related to past poor dietary habits and physical inactivity as evidenced by patient w/ completed Bariatric surgery following dietary guidelines for continued weight loss and healthy nutrition status.     NUTRITION INTERVENTION Nutrition counseling (C-1) and education (E-2) to facilitate bariatric surgery goals, including:  Diet advancement to the next phase now including non-starchy vegetables  The importance of consuming adequate calories as well as certain nutrients daily due to the body's need for essential vitamins, minerals, and fats  The importance of daily physical activity and to reach a goal of at least 150 minutes of moderate to vigorous physical activity weekly (or as directed by their physician) due to benefits such as increased musculature and improved lab values  Pt Chosen Goals: -Continue to aim for a minimum of 64 fluid ounces 7 days a week with at least 30 ounces being plain water -Eat non-starchy vegetables 2 times a day 7 days a week -Start out with soft cooked vegetables today and tomorrow; if tolerated begin to eat raw vegetables or cooked including salads -Eat your 3 ounces of protein first then start in on your non-starchy vegetables; once you understand how much of your  meal leads to satisfaction and not full while still eating 3 ounces of protein and non-starchy vegetables you can eat them in any order  -continue to aim for 30 minutes of activity at least 5 times a week -Do NOT cook with/add to your food: alfredo sauce, cheese sauce, barbeque sauce, ketchup, fat back, butter, bacon grease, grease, Crisco  -Try kefir 4-8 ounces in the morning  -Low fat cows milk is okay to drink  Handouts Provided Include  Non starchy with protein   Readiness for Change: REady  Demonstrated degree of understanding  via: Teach Back       MONITORING & EVALUATION Dietary intake, weekly physical activity, and body weight follow up in 2 months

## 2019-05-05 ENCOUNTER — Encounter: Payer: Self-pay | Admitting: Emergency Medicine

## 2019-05-05 ENCOUNTER — Ambulatory Visit (INDEPENDENT_AMBULATORY_CARE_PROVIDER_SITE_OTHER): Payer: 59 | Admitting: Emergency Medicine

## 2019-05-05 ENCOUNTER — Other Ambulatory Visit: Payer: Self-pay

## 2019-05-05 VITALS — BP 100/67 | HR 70 | Temp 98.2°F | Resp 16 | Ht 67.0 in | Wt 242.0 lb

## 2019-05-05 DIAGNOSIS — Z13228 Encounter for screening for other metabolic disorders: Secondary | ICD-10-CM | POA: Diagnosis not present

## 2019-05-05 DIAGNOSIS — Z9884 Bariatric surgery status: Secondary | ICD-10-CM | POA: Diagnosis not present

## 2019-05-05 DIAGNOSIS — Z13220 Encounter for screening for lipoid disorders: Secondary | ICD-10-CM | POA: Diagnosis not present

## 2019-05-05 DIAGNOSIS — Z0001 Encounter for general adult medical examination with abnormal findings: Secondary | ICD-10-CM | POA: Diagnosis not present

## 2019-05-05 DIAGNOSIS — Z1329 Encounter for screening for other suspected endocrine disorder: Secondary | ICD-10-CM | POA: Diagnosis not present

## 2019-05-05 DIAGNOSIS — Z130 Encounter for screening for diseases of the blood and blood-forming organs and certain disorders involving the immune mechanism: Secondary | ICD-10-CM

## 2019-05-05 DIAGNOSIS — Z0000 Encounter for general adult medical examination without abnormal findings: Secondary | ICD-10-CM

## 2019-05-05 NOTE — Progress Notes (Signed)
Lisa Le 38 y.o.   Chief Complaint  Patient presents with  . Annual Exam    HISTORY OF PRESENT ILLNESS: This is a 38 y.o. female here for her annual exam. No chronic medical problems except for morbid obesity status post cholecystectomy and gastric sleeve surgery last July.  Doing well, losing weight, following nutritional advice from dietitian, has no complaints or medical concerns today.  HPI   Prior to Admission medications   Medication Sig Start Date End Date Taking? Authorizing Provider  acetaminophen (TYLENOL) 325 MG tablet Take 650 mg by mouth every 6 (six) hours as needed.   Yes [provider]  calcium citrate-vitamin D (CALCIUM CITRATE CHEWY BITE) 500-500 MG-UNIT chewable tablet Chew 1 tablet by mouth 3 (three) times daily.   Yes [provider]  Multiple Vitamins-Minerals (BARIATRIC MULTIVITAMINS/IRON PO) Take 1 tablet by mouth 2 (two) times a day.   Yes [provider]  ibuprofen (ADVIL) 200 MG tablet Take 600 mg by mouth every 8 (eight) hours as needed (pain.).    [provider]  ondansetron (ZOFRAN-ODT) 4 MG disintegrating tablet Take 4 mg by mouth every 6 (six) hours as needed for nausea/vomiting. 01/29/19   [provider]  pantoprazole (PROTONIX) 40 MG tablet Take 40 mg by mouth daily. 01/29/19   [provider]  traMADol (ULTRAM) 50 MG tablet Take 1 tablet (50 mg total) by mouth every 6 (six) hours as needed (pain). Patient not taking: Reported on 05/05/2019 02/23/19   [name], MD    Allergies  Allergen Reactions  . Latex Swelling    SWELLING LIPS    Patient Active Problem List   Diagnosis Date Noted  . Morbid obesity (HCC) 02/22/2019    Past Medical History:  Diagnosis Date  . Complication of anesthesia    unable to urinate after ORIF ankle, had to insert Foley; had constipation x 1 week  . Dental crown present    tooth #9  . Post-traumatic arthritis of ankle, left 07/2015  . Retained  orthopedic hardware 07/2016   left ankle  . Tension headache     Past Surgical History:  Procedure Laterality Date  . ANKLE ARTHROSCOPY Left 07/25/2016   Procedure: LEFT ANKLE ARTHROSCOPY WITH EXTENSIVE DEBRIDEMENT;  Surgeon: Toni Arthurs, MD;  Location: [facility];  Service: Orthopedics;  Laterality: Left;  . CHOLECYSTECTOMY N/A 02/22/2019   Procedure: LAPAROSCOPIC CHOLECYSTECTOMY WITH INTRAOPERATIVE CHOLANGIOGRAM;  Surgeon: [name], MD;  Location: [facility];  Service: General;  Laterality: N/A;  . HARDWARE REMOVAL Left 07/25/2016   Procedure: LEFT ANKLE REMOVAL OF DEEP IMPLANTS;  Surgeon: Toni Arthurs, MD;  Location: [facility];  Service: Orthopedics;  Laterality: Left;  . LAPAROSCOPIC GASTRIC SLEEVE RESECTION N/A 02/22/2019   Procedure: LAPAROSCOPIC GASTRIC SLEEVE RESECTION;  Surgeon: [name], MD;  Location: [facility];  Service: General;  Laterality: N/A;  . ORIF ANKLE FRACTURE Left 12/27/2014   Procedure: OPEN REDUCTION INTERNAL FIXATION (ORIF) LEFT ANKLE FRACTURE;  Surgeon: Kathryne Hitch, MD;  Location: [facility];  Service: Orthopedics;  Laterality: Left;    Social History   Socioeconomic History  . Marital status: Single    Spouse name: Not on file  . Number of children: Not on file  . Years of education: Not on file  . Highest education level: Not on file  Occupational History  . Not on file  Social Needs  . Financial resource strain: Not on file  . Food insecurity    Worry: Not  on file    Inability: Not on file  . Transportation needs    Medical: Not on file    Non-medical: Not on file  Tobacco Use  . Smoking status: Never Smoker  . Smokeless tobacco: Never Used  Substance and Sexual Activity  . Alcohol use: No    Alcohol/week: 0.0 standard drinks  . Drug use: No  . Sexual activity: Not on file  Lifestyle  . Physical activity    Days per week: Not on file    Minutes per session: Not on file  . Stress: Not on file   Relationships  . Social Musicianconnections    Talks on phone: Not on file    Gets together: Not on file    Attends religious service: Not on file    Active member of club or organization: Not on file    Attends meetings of clubs or organizations: Not on file    Relationship status: Not on file  . Intimate partner violence    Fear of current or ex partner: Not on file    Emotionally abused: Not on file    Physically abused: Not on file    Forced sexual activity: Not on file  Other Topics Concern  . Not on file  Social History Narrative  . Not on file    Family History  Problem Relation Age of Onset  . Heart disease Mother   . Hypertension Mother      Review of Systems  Constitutional: Negative.  Negative for fever.  HENT: Negative.  Negative for congestion and sore throat.   Eyes: Negative.   Respiratory: Negative.  Negative for cough and shortness of breath.   Cardiovascular: Negative.  Negative for chest pain and palpitations.  Gastrointestinal: Negative.  Negative for abdominal pain, diarrhea, nausea and vomiting.  Genitourinary: Negative.   Musculoskeletal: Negative.  Negative for back pain, myalgias and neck pain.  Skin: Negative.  Negative for rash.  Neurological: Negative.  Negative for dizziness and headaches.  Endo/Heme/Allergies: Negative.    Vitals:   05/05/19 0815  BP: 100/67  Pulse: 70  Resp: 16  Temp: 98.2 F (36.8 C)  SpO2: 97%     Physical Exam Vitals signs reviewed.  Constitutional:      Appearance: Normal appearance.  HENT:     Head: Normocephalic.  Eyes:     Extraocular Movements: Extraocular movements intact.     Conjunctiva/sclera: Conjunctivae normal.     Pupils: Pupils are equal, round, and reactive to light.  Neck:     Musculoskeletal: Normal range of motion and neck supple.  Cardiovascular:     Rate and Rhythm: Normal rate and regular rhythm.     Pulses: Normal pulses.     Heart sounds: Normal heart sounds.  Pulmonary:     Effort:  Pulmonary effort is normal.     Breath sounds: Normal breath sounds.  Abdominal:     Palpations: Abdomen is soft.     Tenderness: There is no abdominal tenderness.  Musculoskeletal: Normal range of motion.  Skin:    General: Skin is warm and dry.     Capillary Refill: Capillary refill takes less than 2 seconds.  Neurological:     General: No focal deficit present.     Mental Status: She is alert and oriented to person, place, and time.  Psychiatric:        Mood and Affect: Mood normal.        Behavior: Behavior normal.  ASSESSMENT & PLAN: Lisa Le was seen today for annual exam.  Diagnoses and all orders for this visit:  Routine general medical examination at a health care facility  Screening for deficiency anemia -     CBC with Differential/Platelet  Screening for lipoid disorders -     Lipid panel  Screening for endocrine, metabolic and immunity disorder -     Comprehensive metabolic panel  Morbid obesity (HCC)  Status post bariatric surgery    Patient Instructions       If you have lab work done today you will be contacted with your lab results within the next 2 weeks.  If you have not heard from [place] then please contact us. The fastest way to get your results is to register for My Chart.   IF you received an x-ray today, you will receive an invoice from [facility]. Please contact [facility] at [phone] with questions or concerns regarding your invoice.   IF you received labwork today, you will receive an invoice from [place]. Please contact [facility] at [phone] with questions or concerns regarding your invoice.   Our billing staff will not be able to assist you with questions regarding bills from these companies.  You will be contacted with the lab results as soon as they are available. The fastest way to get your results is to activate your My Chart account. Instructions are located on the last page of this paperwork. If you  have not heard from [place] regarding the results in 2 weeks, please contact this office.      Health Maintenance, Female Adopting a healthy lifestyle and getting preventive care are important in promoting health and wellness. Ask your health care provider about:  The right schedule for you to have regular tests and exams.  Things you can do on your own to prevent diseases and keep yourself healthy. What should I know about diet, weight, and exercise? Eat a healthy diet   Eat a diet that includes plenty of vegetables, fruits, low-fat dairy products, and lean protein.  Do not eat a lot of foods that are high in solid fats, added sugars, or sodium. Maintain a healthy weight Body mass index (BMI) is used to identify weight problems. It estimates body fat based on height and weight. Your health care provider can help determine your BMI and help you achieve or maintain a healthy weight. Get regular exercise Get regular exercise. This is one of the most important things you can do for your health. Most adults should:  Exercise for at least 150 minutes each week. The exercise should increase your heart rate and make you sweat (moderate-intensity exercise).  Do strengthening exercises at least twice a week. This is in addition to the moderate-intensity exercise.  Spend less time sitting. Even light physical activity can be beneficial. Watch cholesterol and blood lipids Have your blood tested for lipids and cholesterol at 39 years of age, then have this test every 5 years. Have your cholesterol levels checked more often if:  Your lipid or cholesterol levels are high.  You are older than 39 years of age.  You are at high risk for heart disease. What should I know about cancer screening? Depending on your health history and family history, you may need to have cancer screening at various ages. This may include screening for:  Breast cancer.  Cervical cancer.  Colorectal cancer.  Skin  cancer.  Lung cancer. What should I know about heart disease, diabetes, and high blood  pressure? Blood pressure and heart disease  High blood pressure causes heart disease and increases the risk of stroke. This is more likely to develop in people who have high blood pressure readings, are of African descent, or are overweight.  Have your blood pressure checked: ? Every 3-5 years if you are 42-86 years of age. ? Every year if you are 76 years old or older. Diabetes Have regular diabetes screenings. This checks your fasting blood sugar level. Have the screening done:  Once every three years after age 73 if you are at a normal weight and have a low risk for diabetes.  More often and at a younger age if you are overweight or have a high risk for diabetes. What should I know about preventing infection? Hepatitis B If you have a higher risk for hepatitis B, you should be screened for this virus. Talk with your health care provider to find out if you are at risk for hepatitis B infection. Hepatitis C Testing is recommended for:  Everyone born from [birth year] through 1966.  Anyone with known risk factors for hepatitis C. Sexually transmitted infections (STIs)  Get screened for STIs, including gonorrhea and chlamydia, if: ? You are sexually active and are younger than 39 years of age. ? You are older than 39 years of age and your health care provider tells you that you are at risk for this type of infection. ? Your sexual activity has changed since you were last screened, and you are at increased risk for chlamydia or gonorrhea. Ask your health care provider if you are at risk.  Ask your health care provider about whether you are at high risk for HIV. Your health care provider may recommend a prescription medicine to help prevent HIV infection. If you choose to take medicine to prevent HIV, you should first get tested for HIV. You should then be tested every 3 months for as long as you are taking  the medicine. Pregnancy  If you are about to stop having your period (premenopausal) and you may become pregnant, seek counseling before you get pregnant.  Take 400 to 800 micrograms (mcg) of folic acid every day if you become pregnant.  Ask for birth control (contraception) if you want to prevent pregnancy. Osteoporosis and menopause Osteoporosis is a disease in which the bones lose minerals and strength with aging. This can result in bone fractures. If you are 76 years old or older, or if you are at risk for osteoporosis and fractures, ask your health care provider if you should:  Be screened for bone loss.  Take a calcium or vitamin D supplement to lower your risk of fractures.  Be given hormone replacement therapy (HRT) to treat symptoms of menopause. Follow these instructions at home: Lifestyle  Do not use any products that contain nicotine or tobacco, such as cigarettes, e-cigarettes, and chewing tobacco. If you need help quitting, ask your health care provider.  Do not use street drugs.  Do not share needles.  Ask your health care provider for help if you need support or information about quitting drugs. Alcohol use  Do not drink alcohol if: ? Your health care provider tells you not to drink. ? You are pregnant, may be pregnant, or are planning to become pregnant.  If you drink alcohol: ? Limit how much you use to 0-1 drink a day. ? Limit intake if you are breastfeeding.  Be aware of how much alcohol is in your drink. In the [place] one  drink equals one 12 oz bottle of beer (355 mL), one 5 oz glass of wine (148 mL), or one 1 oz glass of hard liquor (44 mL). General instructions  Schedule regular health, dental, and eye exams.  Stay current with your vaccines.  Tell your health care provider if: ? You often feel depressed. ? You have ever been abused or do not feel safe at home. Summary  Adopting a healthy lifestyle and getting preventive care are important in  promoting health and wellness.  Follow your health care provider's instructions about healthy diet, exercising, and getting tested or screened for diseases.  Follow your health care provider's instructions on monitoring your cholesterol and blood pressure. This information is not intended to replace advice given to you by your health care provider. Make sure you discuss any questions you have with your health care provider. Document Released: 02/04/2011 Document Revised: 07/15/2018 Document Reviewed: 07/15/2018 Elsevier Patient Education  2020 Elsevier Inc.       Agustina Caroli, MD [facility]

## 2019-05-05 NOTE — Patient Instructions (Addendum)
   If you have lab work done today you will be contacted with your lab results within the next 2 weeks.  If you have not heard from us then please contact us. The fastest way to get your results is to register for My Chart.   IF you received an x-ray today, you will receive an invoice from [facility]. Please contact [facility] at [phone] with questions or concerns regarding your invoice.   IF you received labwork today, you will receive an invoice from [facility]. Please contact [facility] at [phone] with questions or concerns regarding your invoice.   Our billing staff will not be able to assist you with questions regarding bills from these companies.  You will be contacted with the lab results as soon as they are available. The fastest way to get your results is to activate your My Chart account. Instructions are located on the last page of this paperwork. If you have not heard from us regarding the results in 2 weeks, please contact this office.      Health Maintenance, Female Adopting a healthy lifestyle and getting preventive care are important in promoting health and wellness. Ask your health care provider about:  The right schedule for you to have regular tests and exams.  Things you can do on your own to prevent diseases and keep yourself healthy. What should I know about diet, weight, and exercise? Eat a healthy diet   Eat a diet that includes plenty of vegetables, fruits, low-fat dairy products, and lean protein.  Do not eat a lot of foods that are high in solid fats, added sugars, or sodium. Maintain a healthy weight Body mass index (BMI) is used to identify weight problems. It estimates body fat based on height and weight. Your health care provider can help determine your BMI and help you achieve or maintain a healthy weight. Get regular exercise Get regular exercise. This is one of the most important things you can do for your health. Most  adults should:  Exercise for at least 150 minutes each week. The exercise should increase your heart rate and make you sweat (moderate-intensity exercise).  Do strengthening exercises at least twice a week. This is in addition to the moderate-intensity exercise.  Spend less time sitting. Even light physical activity can be beneficial. Watch cholesterol and blood lipids Have your blood tested for lipids and cholesterol at 39 years of age, then have this test every 5 years. Have your cholesterol levels checked more often if:  Your lipid or cholesterol levels are high.  You are older than 40 years of age.  You are at high risk for heart disease. What should I know about cancer screening? Depending on your health history and family history, you may need to have cancer screening at various ages. This may include screening for:  Breast cancer.  Cervical cancer.  Colorectal cancer.  Skin cancer.  Lung cancer. What should I know about heart disease, diabetes, and high blood pressure? Blood pressure and heart disease  High blood pressure causes heart disease and increases the risk of stroke. This is more likely to develop in people who have high blood pressure readings, are of African descent, or are overweight.  Have your blood pressure checked: ? Every 3-5 years if you are 18-39 years of age. ? Every year if you are 40 years old or older. Diabetes Have regular diabetes screenings. This checks your fasting blood sugar level. Have the screening done:  Once every   three years after age 40 if you are at a normal weight and have a low risk for diabetes.  More often and at a younger age if you are overweight or have a high risk for diabetes. What should I know about preventing infection? Hepatitis B If you have a higher risk for hepatitis B, you should be screened for this virus. Talk with your health care provider to find out if you are at risk for hepatitis B infection. Hepatitis  C Testing is recommended for:  Everyone born from 1945 through 1965.  Anyone with known risk factors for hepatitis C. Sexually transmitted infections (STIs)  Get screened for STIs, including gonorrhea and chlamydia, if: ? You are sexually active and are younger than 39 years of age. ? You are older than 39 years of age and your health care provider tells you that you are at risk for this type of infection. ? Your sexual activity has changed since you were last screened, and you are at increased risk for chlamydia or gonorrhea. Ask your health care provider if you are at risk.  Ask your health care provider about whether you are at high risk for HIV. Your health care provider may recommend a prescription medicine to help prevent HIV infection. If you choose to take medicine to prevent HIV, you should first get tested for HIV. You should then be tested every 3 months for as long as you are taking the medicine. Pregnancy  If you are about to stop having your period (premenopausal) and you may become pregnant, seek counseling before you get pregnant.  Take 400 to 800 micrograms (mcg) of folic acid every day if you become pregnant.  Ask for birth control (contraception) if you want to prevent pregnancy. Osteoporosis and menopause Osteoporosis is a disease in which the bones lose minerals and strength with aging. This can result in bone fractures. If you are 65 years old or older, or if you are at risk for osteoporosis and fractures, ask your health care provider if you should:  Be screened for bone loss.  Take a calcium or vitamin D supplement to lower your risk of fractures.  Be given hormone replacement therapy (HRT) to treat symptoms of menopause. Follow these instructions at home: Lifestyle  Do not use any products that contain nicotine or tobacco, such as cigarettes, e-cigarettes, and chewing tobacco. If you need help quitting, ask your health care provider.  Do not use street  drugs.  Do not share needles.  Ask your health care provider for help if you need support or information about quitting drugs. Alcohol use  Do not drink alcohol if: ? Your health care provider tells you not to drink. ? You are pregnant, may be pregnant, or are planning to become pregnant.  If you drink alcohol: ? Limit how much you use to 0-1 drink a day. ? Limit intake if you are breastfeeding.  Be aware of how much alcohol is in your drink. In the [place] one drink equals one 12 oz bottle of beer (355 mL), one 5 oz glass of wine (148 mL), or one 1 oz glass of hard liquor (44 mL). General instructions  Schedule regular health, dental, and eye exams.  Stay current with your vaccines.  Tell your health care provider if: ? You often feel depressed. ? You have ever been abused or do not feel safe at home. Summary  Adopting a healthy lifestyle and getting preventive care are important in promoting health and wellness.    Follow your health care provider's instructions about healthy diet, exercising, and getting tested or screened for diseases.  Follow your health care provider's instructions on monitoring your cholesterol and blood pressure. This information is not intended to replace advice given to you by your health care provider. Make sure you discuss any questions you have with your health care provider. Document Released: 02/04/2011 Document Revised: 07/15/2018 Document Reviewed: 07/15/2018 Elsevier Patient Education  2020 [employer]vier Inc.  

## 2019-05-06 ENCOUNTER — Encounter: Payer: Self-pay | Admitting: Emergency Medicine

## 2019-05-06 LAB — CBC WITH DIFFERENTIAL/PLATELET
Basophils Absolute: 0 10*3/uL (ref 0.0–0.2)
Basos: 0 %
EOS (ABSOLUTE): 0.1 10*3/uL (ref 0.0–0.4)
Eos: 2 %
Hematocrit: 39.1 % (ref 34.0–46.6)
Hemoglobin: 12.6 g/dL (ref 11.1–15.9)
Immature Grans (Abs): 0 10*3/uL (ref 0.0–0.1)
Immature Granulocytes: 0 %
Lymphocytes Absolute: 2.1 10*3/uL (ref 0.7–3.1)
Lymphs: 41 %
MCH: 27.6 pg (ref 26.6–33.0)
MCHC: 32.2 g/dL (ref 31.5–35.7)
MCV: 86 fL (ref 79–97)
Monocytes Absolute: 0.6 10*3/uL (ref 0.1–0.9)
Monocytes: 11 %
Neutrophils Absolute: 2.4 10*3/uL (ref 1.4–7.0)
Neutrophils: 46 %
Platelets: 208 10*3/uL (ref 150–450)
RBC: 4.56 x10E6/uL (ref 3.77–5.28)
RDW: 15.2 % (ref 11.7–15.4)
WBC: 5.2 10*3/uL (ref 3.4–10.8)

## 2019-05-06 LAB — COMPREHENSIVE METABOLIC PANEL
ALT: 11 IU/L (ref 0–32)
AST: 14 IU/L (ref 0–40)
Albumin/Globulin Ratio: 1.9 (ref 1.2–2.2)
Albumin: 4.4 g/dL (ref 3.8–4.8)
Alkaline Phosphatase: 81 IU/L (ref 39–117)
BUN/Creatinine Ratio: 22 (ref 9–23)
BUN: 14 mg/dL (ref 6–20)
Bilirubin Total: <0.2 mg/dL (ref 0.0–1.2)
CO2: 24 mmol/L (ref 20–29)
Calcium: 8.8 mg/dL (ref 8.7–10.2)
Chloride: 105 mmol/L (ref 96–106)
Creatinine, Ser: 0.65 mg/dL (ref 0.57–1.00)
GFR calc Af Amer: 129 mL/min/{1.73_m2} (ref >59)
GFR calc non Af Amer: 112 mL/min/{1.73_m2} (ref >59)
Globulin, Total: 2.3 g/dL (ref 1.5–4.5)
Glucose: 90 mg/dL (ref 65–99)
Potassium: 4.1 mmol/L (ref 3.5–5.2)
Sodium: 142 mmol/L (ref 134–144)
Total Protein: 6.7 g/dL (ref 6.0–8.5)

## 2019-05-06 LAB — LIPID PANEL
Chol/HDL Ratio: 4 ratio (ref 0.0–4.4)
Cholesterol, Total: 131 mg/dL (ref 100–199)
HDL: 33 mg/dL — ABNORMAL LOW (ref >39)
LDL Chol Calc (NIH): 82 mg/dL (ref 0–99)
Triglycerides: 83 mg/dL (ref 0–149)
VLDL Cholesterol Cal: 16 mg/dL (ref 5–40)

## 2019-10-29 MED FILL — HYDROCODON-APAP 5-325: 5-325 | 7 days supply | Qty: 30 | Fill #0

## 2019-11-22 MED FILL — IBUPROFEN 800 MG TAB: 800 | 20 days supply | Qty: 60 | Fill #0

## 2020-03-08 DIAGNOSIS — H52223 Regular astigmatism, bilateral: Secondary | ICD-10-CM | POA: Diagnosis not present

## 2020-03-25 DIAGNOSIS — Z23 Encounter for immunization: Secondary | ICD-10-CM | POA: Diagnosis not present

## 2020-05-05 ENCOUNTER — Encounter: Payer: 59 | Admitting: Emergency Medicine

## 2020-07-19 ENCOUNTER — Encounter: Payer: 59 | Admitting: Emergency Medicine

## 2020-08-23 DIAGNOSIS — Z1152 Encounter for screening for COVID-19: Secondary | ICD-10-CM | POA: Diagnosis not present

## 2020-08-25 ENCOUNTER — Encounter: Payer: Self-pay | Admitting: Emergency Medicine

## 2020-09-19 ENCOUNTER — Encounter (HOSPITAL_COMMUNITY): Payer: Self-pay | Admitting: *Deleted

## 2020-10-25 ENCOUNTER — Encounter: Payer: 59 | Admitting: Emergency Medicine

## 2021-01-11 ENCOUNTER — Other Ambulatory Visit: Payer: Self-pay

## 2021-01-11 ENCOUNTER — Ambulatory Visit (INDEPENDENT_AMBULATORY_CARE_PROVIDER_SITE_OTHER): Payer: 59 | Admitting: Emergency Medicine

## 2021-01-11 ENCOUNTER — Encounter: Payer: Self-pay | Admitting: Emergency Medicine

## 2021-01-11 VITALS — BP 106/70 | HR 85 | Temp 98.4°F | Ht 67.0 in | Wt 247.0 lb

## 2021-01-11 DIAGNOSIS — Z1329 Encounter for screening for other suspected endocrine disorder: Secondary | ICD-10-CM

## 2021-01-11 DIAGNOSIS — Z130 Encounter for screening for diseases of the blood and blood-forming organs and certain disorders involving the immune mechanism: Secondary | ICD-10-CM

## 2021-01-11 DIAGNOSIS — Z0000 Encounter for general adult medical examination without abnormal findings: Secondary | ICD-10-CM

## 2021-01-11 DIAGNOSIS — Z13228 Encounter for screening for other metabolic disorders: Secondary | ICD-10-CM

## 2021-01-11 DIAGNOSIS — Z13220 Encounter for screening for lipoid disorders: Secondary | ICD-10-CM | POA: Diagnosis not present

## 2021-01-11 LAB — LIPID PANEL
Cholesterol: 135 mg/dL (ref 0–200)
HDL: 32.5 mg/dL — ABNORMAL LOW (ref >39.00)
LDL Cholesterol: 73 mg/dL (ref 0–99)
NonHDL: 102.35
Total CHOL/HDL Ratio: 4
Triglycerides: 145 mg/dL (ref 0.0–149.0)
VLDL: 29 mg/dL (ref 0.0–40.0)

## 2021-01-11 LAB — COMPREHENSIVE METABOLIC PANEL
ALT: 20 U/L (ref 0–35)
AST: 23 U/L (ref 0–37)
Albumin: 4.4 g/dL (ref 3.5–5.2)
Alkaline Phosphatase: 80 U/L (ref 39–117)
BUN: 12 mg/dL (ref 6–23)
CO2: 31 mEq/L (ref 19–32)
Calcium: 8.9 mg/dL (ref 8.4–10.5)
Chloride: 103 mEq/L (ref 96–112)
Creatinine, Ser: 0.64 mg/dL (ref 0.40–1.20)
GFR: 109.83 mL/min (ref >60.00)
Glucose, Bld: 83 mg/dL (ref 70–99)
Potassium: 4.2 mEq/L (ref 3.5–5.1)
Sodium: 141 mEq/L (ref 135–145)
Total Bilirubin: 0.3 mg/dL (ref 0.2–1.2)
Total Protein: 7.4 g/dL (ref 6.0–8.3)

## 2021-01-11 LAB — CBC WITH DIFFERENTIAL/PLATELET
Basophils Absolute: 0 10*3/uL (ref 0.0–0.1)
Basophils Relative: 0.4 % (ref 0.0–3.0)
Eosinophils Absolute: 0.1 10*3/uL (ref 0.0–0.7)
Eosinophils Relative: 2.2 % (ref 0.0–5.0)
HCT: 36.8 % (ref 36.0–46.0)
Hemoglobin: 12.4 g/dL (ref 12.0–15.0)
Lymphocytes Relative: 38.8 % (ref 12.0–46.0)
Lymphs Abs: 2 10*3/uL (ref 0.7–4.0)
MCHC: 33.7 g/dL (ref 30.0–36.0)
MCV: 79 fl (ref 78.0–100.0)
Monocytes Absolute: 0.5 10*3/uL (ref 0.1–1.0)
Monocytes Relative: 10.1 % (ref 3.0–12.0)
Neutro Abs: 2.5 10*3/uL (ref 1.4–7.7)
Neutrophils Relative %: 48.5 % (ref 43.0–77.0)
Platelets: 263 10*3/uL (ref 150.0–400.0)
RBC: 4.66 Mil/uL (ref 3.87–5.11)
RDW: 14.4 % (ref 11.5–15.5)
WBC: 5.1 10*3/uL (ref 4.0–10.5)

## 2021-01-11 LAB — TSH: TSH: 2.78 u[IU]/mL (ref 0.35–4.50)

## 2021-01-11 LAB — HEMOGLOBIN A1C: Hgb A1c MFr Bld: 5.3 % (ref 4.6–6.5)

## 2021-01-11 NOTE — Progress Notes (Signed)
Lisa Le 40 y.o.   Chief Complaint  Patient presents with   Annual Exam    HISTORY OF PRESENT ILLNESS: This is a 40 y.o. female here for her annual exam. No chronic medical problems. Status post gastric bypass surgery about 1 year ago along with cholecystectomy. On no chronic medications. Has no complaints or medical concerns today. Last visit to gynecologist in 2018.  Needs to schedule annual visit and needs Pap smear.  HPI   Prior to Admission medications   Medication Sig Start Date End Date Taking? Authorizing Provider  acetaminophen (TYLENOL) 325 MG tablet Take 650 mg by mouth every 6 (six) hours as needed.   Yes [provider]  calcium citrate-vitamin D (CALCIUM CITRATE CHEWY BITE) 500-500 MG-UNIT chewable tablet Chew 1 tablet by mouth 3 (three) times daily.   Yes [provider]  ibuprofen (ADVIL) 200 MG tablet Take 600 mg by mouth every 8 (eight) hours as needed (pain.).   Yes [provider]  Multiple Vitamins-Minerals (BARIATRIC MULTIVITAMINS/IRON PO) Take 1 tablet by mouth 2 (two) times a day.   Yes [provider]  ondansetron (ZOFRAN-ODT) 4 MG disintegrating tablet Take 4 mg by mouth every 6 (six) hours as needed for nausea/vomiting. 01/29/19   [provider]  pantoprazole (PROTONIX) 40 MG tablet Take 40 mg by mouth daily. 01/29/19   [provider]  traMADol (ULTRAM) 50 MG tablet Take 1 tablet (50 mg total) by mouth every 6 (six) hours as needed (pain). Patient not taking: Reported on 05/05/2019 02/23/19   Ovidio KinNewman, David, MD    Allergies  Allergen Reactions   Latex Swelling    SWELLING LIPS    Patient Active Problem List   Diagnosis Date Noted   Status post bariatric surgery 05/05/2019   Morbid obesity (HCC) 02/22/2019    Past Medical History:  Diagnosis Date   Complication of anesthesia    unable to urinate after ORIF ankle, had to insert Foley; had constipation x 1 week   Dental crown present     tooth #9   Post-traumatic arthritis of ankle, left 07/2015   Retained orthopedic hardware 07/2016   left ankle   Tension headache     Past Surgical History:  Procedure Laterality Date   ANKLE ARTHROSCOPY Left 07/25/2016   Procedure: LEFT ANKLE ARTHROSCOPY WITH EXTENSIVE DEBRIDEMENT;  Surgeon: Toni ArthursJohn Hewitt, MD;  Location: [facility];  Service: Orthopedics;  Laterality: Left;   CHOLECYSTECTOMY N/A 02/22/2019   Procedure: LAPAROSCOPIC CHOLECYSTECTOMY WITH INTRAOPERATIVE CHOLANGIOGRAM;  Surgeon: Ovidio KinNewman, David, MD;  Location: [facility];  Service: General;  Laterality: N/A;   HARDWARE REMOVAL Left 07/25/2016   Procedure: LEFT ANKLE REMOVAL OF DEEP IMPLANTS;  Surgeon: Toni ArthursJohn Hewitt, MD;  Location: [facility];  Service: Orthopedics;  Laterality: Left;   LAPAROSCOPIC GASTRIC SLEEVE RESECTION N/A 02/22/2019   Procedure: LAPAROSCOPIC GASTRIC SLEEVE RESECTION;  Surgeon: Ovidio KinNewman, David, MD;  Location: [facility];  Service: General;  Laterality: N/A;   ORIF ANKLE FRACTURE Left 12/27/2014   Procedure: OPEN REDUCTION INTERNAL FIXATION (ORIF) LEFT ANKLE FRACTURE;  Surgeon: Kathryne Hitchhristopher Y Blackman, MD;  Location: [facility];  Service: Orthopedics;  Laterality: Left;    Social History   Socioeconomic History   Marital status: Single    Spouse name: Not on file   Number of children: Not on file   Years of education: Not on file   Highest education level: Not on file  Occupational History   Not on file  Tobacco Use  Smoking status: Never   Smokeless tobacco: Never  Vaping Use   Vaping Use: Never used  Substance and Sexual Activity   Alcohol use: No    Alcohol/week: 0.0 standard drinks   Drug use: No   Sexual activity: Not on file  Other Topics Concern   Not on file  Social History Narrative   Not on file   Social Determinants of Health   Financial Resource Strain: Not on file  Food Insecurity: Not on file  Transportation Needs: Not on file  Physical Activity: Not on file   Stress: Not on file  Social Connections: Not on file  Intimate Partner Violence: Not on file    Family History  Problem Relation Age of Onset   Heart disease Mother    Hypertension Mother      Review of Systems  Constitutional: Negative.  Negative for chills and fever.  HENT: Negative.  Negative for congestion and sore throat.   Eyes: Negative.   Respiratory: Negative.  Negative for cough and shortness of breath.   Cardiovascular: Negative.  Negative for chest pain and palpitations.  Gastrointestinal: Negative.  Negative for abdominal pain, blood in stool, diarrhea, nausea and vomiting.  Genitourinary: Negative.  Negative for dysuria and hematuria.  Musculoskeletal: Negative.   Skin: Negative.  Negative for rash.  Neurological: Negative.  Negative for dizziness and headaches.  All other systems reviewed and are negative.   Physical Exam Vitals reviewed.  Constitutional:      Appearance: Normal appearance.  HENT:     Head: Normocephalic.     Right Ear: Tympanic membrane, ear canal and external ear normal.     Left Ear: Tympanic membrane, ear canal and external ear normal.  Eyes:     Extraocular Movements: Extraocular movements intact.     Conjunctiva/sclera: Conjunctivae normal.     Pupils: Pupils are equal, round, and reactive to light.  Cardiovascular:     Rate and Rhythm: Normal rate and regular rhythm.     Pulses: Normal pulses.     Heart sounds: Normal heart sounds.  Pulmonary:     Effort: Pulmonary effort is normal.     Breath sounds: Normal breath sounds.  Abdominal:     Palpations: Abdomen is soft.     Tenderness: There is no abdominal tenderness.  Musculoskeletal:        General: Normal range of motion.     Right lower leg: No edema.     Left lower leg: No edema.  Skin:    General: Skin is warm and dry.     Capillary Refill: Capillary refill takes less than 2 seconds.  Neurological:     General: No focal deficit present.     Mental Status: She is  alert and oriented to person, place, and time.  Psychiatric:        Mood and Affect: Mood normal.        Behavior: Behavior normal.     ASSESSMENT & PLAN: Lisa Le was seen today for annual exam.  Diagnoses and all orders for this visit:  Routine general medical examination at a health care facility  Screening for deficiency anemia -     CBC with Differential  Screening for lipoid disorders -     Lipid panel  Screening for endocrine, metabolic and immunity disorder -     Comprehensive metabolic panel -     Hemoglobin A1c -     TSH  Modifiable risk factors discussed with patient. Anticipatory guidance according to  age provided. The following topics were discussed: Smoking Diet and nutrition Benefits of exercise Cancer screening and need to schedule GYN annual exam. Family history for cancer reviewed Vaccinations Cardiovascular risk assessment Mental health including depression and anxiety Fall and accident prevention  Patient Instructions  Health Maintenance, Female Adopting a healthy lifestyle and getting preventive care are important in promoting health and wellness. Ask your health care provider about: The right schedule for you to have regular tests and exams. Things you can do on your own to prevent diseases and keep yourself healthy. What should I know about diet, weight, and exercise? Eat a healthy diet Eat a diet that includes plenty of vegetables, fruits, low-fat dairy products, and lean protein. Do not eat a lot of foods that are high in solid fats, added sugars, or sodium.   Maintain a healthy weight Body mass index (BMI) is used to identify weight problems. It estimates body fat based on height and weight. Your health care provider can help determine your BMI and help you achieve or maintain a healthy weight. Get regular exercise Get regular exercise. This is one of the most important things you can do for your health. Most adults should: Exercise for at  least 150 minutes each week. The exercise should increase your heart rate and make you sweat (moderate-intensity exercise). Do strengthening exercises at least twice a week. This is in addition to the moderate-intensity exercise. Spend less time sitting. Even light physical activity can be beneficial. Watch cholesterol and blood lipids Have your blood tested for lipids and cholesterol at 41 years of age, then have this test every 5 years. Have your cholesterol levels checked more often if: Your lipid or cholesterol levels are high. You are older than 41 years of age. You are at high risk for heart disease. What should I know about cancer screening? Depending on your health history and family history, you may need to have cancer screening at various ages. This may include screening for: Breast cancer. Cervical cancer. Colorectal cancer. Skin cancer. Lung cancer. What should I know about heart disease, diabetes, and high blood pressure? Blood pressure and heart disease High blood pressure causes heart disease and increases the risk of stroke. This is more likely to develop in people who have high blood pressure readings, are of African descent, or are overweight. Have your blood pressure checked: Every 3-5 years if you are 57-30 years of age. Every year if you are 74 years old or older. Diabetes Have regular diabetes screenings. This checks your fasting blood sugar level. Have the screening done: Once every three years after age 52 if you are at a normal weight and have a low risk for diabetes. More often and at a younger age if you are overweight or have a high risk for diabetes. What should I know about preventing infection? Hepatitis B If you have a higher risk for hepatitis B, you should be screened for this virus. Talk with your health care provider to find out if you are at risk for hepatitis B infection. Hepatitis C Testing is recommended for: Everyone born from [birth year] through  1965. Anyone with known risk factors for hepatitis C. Sexually transmitted infections (STIs) Get screened for STIs, including gonorrhea and chlamydia, if: You are sexually active and are younger than 41 years of age. You are older than 41 years of age and your health care provider tells you that you are at risk for this type of infection. Your sexual activity has changed  since you were last screened, and you are at increased risk for chlamydia or gonorrhea. Ask your health care provider if you are at risk. Ask your health care provider about whether you are at high risk for HIV. Your health care provider may recommend a prescription medicine to help prevent HIV infection. If you choose to take medicine to prevent HIV, you should first get tested for HIV. You should then be tested every 3 months for as long as you are taking the medicine. Pregnancy If you are about to stop having your period (premenopausal) and you may become pregnant, seek counseling before you get pregnant. Take 400 to 800 micrograms (mcg) of folic acid every day if you become pregnant. Ask for birth control (contraception) if you want to prevent pregnancy. Osteoporosis and menopause Osteoporosis is a disease in which the bones lose minerals and strength with aging. This can result in bone fractures. If you are 37 years old or older, or if you are at risk for osteoporosis and fractures, ask your health care provider if you should: Be screened for bone loss. Take a calcium or vitamin D supplement to lower your risk of fractures. Be given hormone replacement therapy (HRT) to treat symptoms of menopause. Follow these instructions at home: Lifestyle Do not use any products that contain nicotine or tobacco, such as cigarettes, e-cigarettes, and chewing tobacco. If you need help quitting, ask your health care provider. Do not use street drugs. Do not share needles. Ask your health care provider for help if you need support or  information about quitting drugs. Alcohol use Do not drink alcohol if: Your health care provider tells you not to drink. You are pregnant, may be pregnant, or are planning to become pregnant. If you drink alcohol: Limit how much you use to 0-1 drink a day. Limit intake if you are breastfeeding. Be aware of how much alcohol is in your drink. In the [place] one drink equals one 12 oz bottle of beer (355 mL), one 5 oz glass of wine (148 mL), or one 1 oz glass of hard liquor (44 mL). General instructions Schedule regular health, dental, and eye exams. Stay current with your vaccines. Tell your health care provider if: You often feel depressed. You have ever been abused or do not feel safe at home. Summary Adopting a healthy lifestyle and getting preventive care are important in promoting health and wellness. Follow your health care provider's instructions about healthy diet, exercising, and getting tested or screened for diseases. Follow your health care provider's instructions on monitoring your cholesterol and blood pressure. This information is not intended to replace advice given to you by your health care provider. Make sure you discuss any questions you have with your health care provider. Document Revised: 07/15/2018 Document Reviewed: 07/15/2018 Elsevier Patient Education  2021 Elsevier Inc.   Edwina Barth, MD [facility]

## 2021-01-11 NOTE — Patient Instructions (Signed)
Health Maintenance, Female Adopting a healthy lifestyle and getting preventive care are important in promoting health and wellness. Ask your health care provider about:  The right schedule for you to have regular tests and exams.  Things you can do on your own to prevent diseases and keep yourself healthy. What should I know about diet, weight, and exercise? Eat a healthy diet  Eat a diet that includes plenty of vegetables, fruits, low-fat dairy products, and lean protein.  Do not eat a lot of foods that are high in solid fats, added sugars, or sodium.   Maintain a healthy weight Body mass index (BMI) is used to identify weight problems. It estimates body fat based on height and weight. Your health care provider can help determine your BMI and help you achieve or maintain a healthy weight. Get regular exercise Get regular exercise. This is one of the most important things you can do for your health. Most adults should:  Exercise for at least 150 minutes each week. The exercise should increase your heart rate and make you sweat (moderate-intensity exercise).  Do strengthening exercises at least twice a week. This is in addition to the moderate-intensity exercise.  Spend less time sitting. Even light physical activity can be beneficial. Watch cholesterol and blood lipids Have your blood tested for lipids and cholesterol at 41 years of age, then have this test every 5 years. Have your cholesterol levels checked more often if:  Your lipid or cholesterol levels are high.  You are older than 40 years of age.  You are at high risk for heart disease. What should I know about cancer screening? Depending on your health history and family history, you may need to have cancer screening at various ages. This may include screening for:  Breast cancer.  Cervical cancer.  Colorectal cancer.  Skin cancer.  Lung cancer. What should I know about heart disease, diabetes, and high blood  pressure? Blood pressure and heart disease  High blood pressure causes heart disease and increases the risk of stroke. This is more likely to develop in people who have high blood pressure readings, are of African descent, or are overweight.  Have your blood pressure checked: ? Every 3-5 years if you are 18-39 years of age. ? Every year if you are 40 years old or older. Diabetes Have regular diabetes screenings. This checks your fasting blood sugar level. Have the screening done:  Once every three years after age 40 if you are at a normal weight and have a low risk for diabetes.  More often and at a younger age if you are overweight or have a high risk for diabetes. What should I know about preventing infection? Hepatitis B If you have a higher risk for hepatitis B, you should be screened for this virus. Talk with your health care provider to find out if you are at risk for hepatitis B infection. Hepatitis C Testing is recommended for:  Everyone born from 1945 through 1965.  Anyone with known risk factors for hepatitis C. Sexually transmitted infections (STIs)  Get screened for STIs, including gonorrhea and chlamydia, if: ? You are sexually active and are younger than 41 years of age. ? You are older than 41 years of age and your health care provider tells you that you are at risk for this type of infection. ? Your sexual activity has changed since you were last screened, and you are at increased risk for chlamydia or gonorrhea. Ask your health care provider   if you are at risk.  Ask your health care provider about whether you are at high risk for HIV. Your health care provider may recommend a prescription medicine to help prevent HIV infection. If you choose to take medicine to prevent HIV, you should first get tested for HIV. You should then be tested every 3 months for as long as you are taking the medicine. Pregnancy  If you are about to stop having your period (premenopausal) and  you may become pregnant, seek counseling before you get pregnant.  Take 400 to 800 micrograms (mcg) of folic acid every day if you become pregnant.  Ask for birth control (contraception) if you want to prevent pregnancy. Osteoporosis and menopause Osteoporosis is a disease in which the bones lose minerals and strength with aging. This can result in bone fractures. If you are 65 years old or older, or if you are at risk for osteoporosis and fractures, ask your health care provider if you should:  Be screened for bone loss.  Take a calcium or vitamin D supplement to lower your risk of fractures.  Be given hormone replacement therapy (HRT) to treat symptoms of menopause. Follow these instructions at home: Lifestyle  Do not use any products that contain nicotine or tobacco, such as cigarettes, e-cigarettes, and chewing tobacco. If you need help quitting, ask your health care provider.  Do not use street drugs.  Do not share needles.  Ask your health care provider for help if you need support or information about quitting drugs. Alcohol use  Do not drink alcohol if: ? Your health care provider tells you not to drink. ? You are pregnant, may be pregnant, or are planning to become pregnant.  If you drink alcohol: ? Limit how much you use to 0-1 drink a day. ? Limit intake if you are breastfeeding.  Be aware of how much alcohol is in your drink. In the [place] one drink equals one 12 oz bottle of beer (355 mL), one 5 oz glass of wine (148 mL), or one 1 oz glass of hard liquor (44 mL). General instructions  Schedule regular health, dental, and eye exams.  Stay current with your vaccines.  Tell your health care provider if: ? You often feel depressed. ? You have ever been abused or do not feel safe at home. Summary  Adopting a healthy lifestyle and getting preventive care are important in promoting health and wellness.  Follow your health care provider's instructions about healthy  diet, exercising, and getting tested or screened for diseases.  Follow your health care provider's instructions on monitoring your cholesterol and blood pressure. This information is not intended to replace advice given to you by your health care provider. Make sure you discuss any questions you have with your health care provider. Document Revised: 07/15/2018 Document Reviewed: 07/15/2018 Elsevier Patient Education  2021 [employer] Inc.  

## 2021-07-20 DIAGNOSIS — H5201 Hypermetropia, right eye: Secondary | ICD-10-CM | POA: Diagnosis not present

## 2021-09-21 ENCOUNTER — Encounter (HOSPITAL_COMMUNITY): Payer: Self-pay | Admitting: *Deleted

## 2021-11-07 DIAGNOSIS — H01113 Allergic dermatitis of right eye, unspecified eyelid: Secondary | ICD-10-CM | POA: Diagnosis not present

## 2022-01-30 DIAGNOSIS — H01113 Allergic dermatitis of right eye, unspecified eyelid: Secondary | ICD-10-CM | POA: Diagnosis not present

## 2022-01-30 DIAGNOSIS — H02826 Cysts of left eye, unspecified eyelid: Secondary | ICD-10-CM | POA: Diagnosis not present

## 2022-01-30 DIAGNOSIS — H04123 Dry eye syndrome of bilateral lacrimal glands: Secondary | ICD-10-CM | POA: Diagnosis not present

## 2022-09-16 ENCOUNTER — Encounter (HOSPITAL_COMMUNITY): Payer: Self-pay | Admitting: *Deleted

## 2023-03-20 ENCOUNTER — Encounter (INDEPENDENT_AMBULATORY_CARE_PROVIDER_SITE_OTHER): Payer: Self-pay

## 2023-04-14 ENCOUNTER — Encounter: Payer: Self-pay | Admitting: Emergency Medicine

## 2023-04-14 ENCOUNTER — Ambulatory Visit (INDEPENDENT_AMBULATORY_CARE_PROVIDER_SITE_OTHER): Payer: 59 | Admitting: Emergency Medicine

## 2023-04-14 VITALS — BP 110/82 | HR 80 | Temp 97.5°F | Wt 236.0 lb

## 2023-04-14 DIAGNOSIS — Z130 Encounter for screening for diseases of the blood and blood-forming organs and certain disorders involving the immune mechanism: Secondary | ICD-10-CM

## 2023-04-14 DIAGNOSIS — Z1159 Encounter for screening for other viral diseases: Secondary | ICD-10-CM | POA: Diagnosis not present

## 2023-04-14 DIAGNOSIS — Z1329 Encounter for screening for other suspected endocrine disorder: Secondary | ICD-10-CM | POA: Diagnosis not present

## 2023-04-14 DIAGNOSIS — Z0000 Encounter for general adult medical examination without abnormal findings: Secondary | ICD-10-CM | POA: Diagnosis not present

## 2023-04-14 DIAGNOSIS — Z13228 Encounter for screening for other metabolic disorders: Secondary | ICD-10-CM | POA: Diagnosis not present

## 2023-04-14 DIAGNOSIS — Z13220 Encounter for screening for lipoid disorders: Secondary | ICD-10-CM | POA: Diagnosis not present

## 2023-04-14 LAB — COMPREHENSIVE METABOLIC PANEL
ALT: 34 U/L (ref 0–35)
AST: 33 U/L (ref 0–37)
Albumin: 4.1 g/dL (ref 3.5–5.2)
Alkaline Phosphatase: 85 U/L (ref 39–117)
BUN: 12 mg/dL (ref 6–23)
CO2: 28 meq/L (ref 19–32)
Calcium: 8.8 mg/dL (ref 8.4–10.5)
Chloride: 103 meq/L (ref 96–112)
Creatinine, Ser: 0.62 mg/dL (ref 0.40–1.20)
GFR: 108.94 mL/min (ref >60.00)
Glucose, Bld: 80 mg/dL (ref 70–99)
Potassium: 4.2 meq/L (ref 3.5–5.1)
Sodium: 137 meq/L (ref 135–145)
Total Bilirubin: 0.3 mg/dL (ref 0.2–1.2)
Total Protein: 7.4 g/dL (ref 6.0–8.3)

## 2023-04-14 LAB — CBC WITH DIFFERENTIAL/PLATELET
Basophils Absolute: 0 10*3/uL (ref 0.0–0.1)
Basophils Relative: 0.4 % (ref 0.0–3.0)
Eosinophils Absolute: 0.1 10*3/uL (ref 0.0–0.7)
Eosinophils Relative: 1.3 % (ref 0.0–5.0)
HCT: 36.4 % (ref 36.0–46.0)
Hemoglobin: 11.4 g/dL — ABNORMAL LOW (ref 12.0–15.0)
Lymphocytes Relative: 29.7 % (ref 12.0–46.0)
Lymphs Abs: 1.9 10*3/uL (ref 0.7–4.0)
MCHC: 31.4 g/dL (ref 30.0–36.0)
MCV: 75.7 fl — ABNORMAL LOW (ref 78.0–100.0)
Monocytes Absolute: 0.6 10*3/uL (ref 0.1–1.0)
Monocytes Relative: 9.3 % (ref 3.0–12.0)
Neutro Abs: 3.7 10*3/uL (ref 1.4–7.7)
Neutrophils Relative %: 59.3 % (ref 43.0–77.0)
Platelets: 299 10*3/uL (ref 150.0–400.0)
RBC: 4.81 Mil/uL (ref 3.87–5.11)
RDW: 15 % (ref 11.5–15.5)
WBC: 6.3 10*3/uL (ref 4.0–10.5)

## 2023-04-14 LAB — TSH: TSH: 2.32 u[IU]/mL (ref 0.35–5.50)

## 2023-04-14 LAB — LIPID PANEL
Cholesterol: 186 mg/dL (ref 0–200)
HDL: 62.7 mg/dL (ref >39.00)
LDL Cholesterol: 104 mg/dL — ABNORMAL HIGH (ref 0–99)
NonHDL: 123.77
Total CHOL/HDL Ratio: 3
Triglycerides: 98 mg/dL (ref 0.0–149.0)
VLDL: 19.6 mg/dL (ref 0.0–40.0)

## 2023-04-14 NOTE — Progress Notes (Signed)
Lisa Le 42 y.o.   Chief Complaint  Patient presents with   Annual Exam    HISTORY OF PRESENT ILLNESS: This is a 42 y.o. female here for annual exam. Healthy female with a healthy lifestyle. Has no complaints or medical concerns today.  HPI   Prior to Admission medications   Medication Sig Start Date End Date Taking? Authorizing Provider  acetaminophen (TYLENOL) 325 MG tablet Take 650 mg by mouth every 6 (six) hours as needed.   Yes [provider]  ibuprofen (ADVIL) 200 MG tablet Take 600 mg by mouth every 8 (eight) hours as needed (pain.).   Yes [provider]  calcium citrate-vitamin D (CALCIUM CITRATE CHEWY BITE) 500-500 MG-UNIT chewable tablet Chew 1 tablet by mouth 3 (three) times daily. Patient not taking: Reported on 04/14/2023    [provider]  Multiple Vitamins-Minerals (BARIATRIC MULTIVITAMINS/IRON PO) Take 1 tablet by mouth 2 (two) times a day. Patient not taking: Reported on 04/14/2023    [provider]    Allergies  Allergen Reactions   Latex Swelling    SWELLING LIPS    Patient Active Problem List   Diagnosis Date Noted   Status post bariatric surgery 05/05/2019   Morbid obesity (HCC) 02/22/2019    Past Medical History:  Diagnosis Date   Complication of anesthesia    unable to urinate after ORIF ankle, had to insert Foley; had constipation x 1 week   Dental crown present    tooth #9   Post-traumatic arthritis of ankle, left 07/2015   Retained orthopedic hardware 07/2016   left ankle   Tension headache     Past Surgical History:  Procedure Laterality Date   ANKLE ARTHROSCOPY Left 07/25/2016   Procedure: LEFT ANKLE ARTHROSCOPY WITH EXTENSIVE DEBRIDEMENT;  Surgeon: Toni Arthurs, MD;  Location: [facility];  Service: Orthopedics;  Laterality: Left;   CHOLECYSTECTOMY N/A 02/22/2019   Procedure: LAPAROSCOPIC CHOLECYSTECTOMY WITH INTRAOPERATIVE CHOLANGIOGRAM;  Surgeon: [name], MD;  Location:  [facility];  Service: General;  Laterality: N/A;   HARDWARE REMOVAL Left 07/25/2016   Procedure: LEFT ANKLE REMOVAL OF DEEP IMPLANTS;  Surgeon: Toni Arthurs, MD;  Location: [facility];  Service: Orthopedics;  Laterality: Left;   LAPAROSCOPIC GASTRIC SLEEVE RESECTION N/A 02/22/2019   Procedure: LAPAROSCOPIC GASTRIC SLEEVE RESECTION;  Surgeon: [name], MD;  Location: [facility];  Service: General;  Laterality: N/A;   ORIF ANKLE FRACTURE Left 12/27/2014   Procedure: OPEN REDUCTION INTERNAL FIXATION (ORIF) LEFT ANKLE FRACTURE;  Surgeon: Kathryne Hitch, MD;  Location: [facility];  Service: Orthopedics;  Laterality: Left;    Social History   Socioeconomic History   Marital status: Single    Spouse name: Not on file   Number of children: Not on file   Years of education: Not on file   Highest education level: Not on file  Occupational History   Not on file  Tobacco Use   Smoking status: Never   Smokeless tobacco: Never  Vaping Use   Vaping status: Never Used  Substance and Sexual Activity   Alcohol use: No    Alcohol/week: 0.0 standard drinks of alcohol   Drug use: No   Sexual activity: Not on file  Other Topics Concern   Not on file  Social History Narrative   Not on file   Social Determinants of Health   Financial Resource Strain: Not on file  Food Insecurity: Not on file  Transportation Needs: Not on file  Physical Activity:  Not on file  Stress: Not on file  Social Connections: Unknown (12/18/2021)   Received from [facility], [facility]   Social Network    Social Network: Not on file  Intimate Partner Violence: Unknown (11/08/2021)   Received from [facility], [facility]   HITS    Physically Hurt: Not on file    Insult or Talk Down To: Not on file    Threaten Physical Harm: Not on file    Scream or Curse: Not on file    Family History  Problem Relation Age of Onset   Heart disease Mother    Hypertension Mother      Review of Systems   Constitutional: Negative.  Negative for chills and fever.  HENT: Negative.  Negative for congestion and sore throat.   Respiratory: Negative.  Negative for cough and shortness of breath.   Cardiovascular: Negative.  Negative for chest pain and palpitations.  Gastrointestinal:  Negative for abdominal pain, diarrhea, nausea and vomiting.  Genitourinary: Negative.  Negative for dysuria and hematuria.  Skin: Negative.  Negative for rash.  Neurological: Negative.  Negative for dizziness and headaches.  All other systems reviewed and are negative.   Vitals:   04/14/23 1510  BP: 110/82  Pulse: 80  Temp: (!) 97.5 F (36.4 C)  SpO2: 96%    Physical Exam Vitals reviewed.  Constitutional:      Appearance: Normal appearance.  HENT:     Head: Normocephalic.     Mouth/Throat:     Mouth: Mucous membranes are moist.     Pharynx: Oropharynx is clear.  Eyes:     Extraocular Movements: Extraocular movements intact.     Conjunctiva/sclera: Conjunctivae normal.     Pupils: Pupils are equal, round, and reactive to light.  Cardiovascular:     Rate and Rhythm: Normal rate and regular rhythm.     Pulses: Normal pulses.     Heart sounds: Normal heart sounds.  Pulmonary:     Effort: Pulmonary effort is normal.     Breath sounds: Normal breath sounds.  Musculoskeletal:     Cervical back: No tenderness.  Lymphadenopathy:     Cervical: No cervical adenopathy.  Skin:    General: Skin is warm and dry.     Capillary Refill: Capillary refill takes less than 2 seconds.  Neurological:     General: No focal deficit present.     Mental Status: She is alert and oriented to person, place, and time.  Psychiatric:        Mood and Affect: Mood normal.        Behavior: Behavior normal.      ASSESSMENT & PLAN: Problem List Items Addressed This Visit   None Visit Diagnoses     Routine general medical examination at a health care facility    -  Primary   Relevant Orders   CBC with Differential    Comprehensive metabolic panel   Hemoglobin A1c   Lipid panel   TSH   Hepatitis C antibody screen   Need for hepatitis C screening test       Relevant Orders   Hepatitis C antibody screen   Screening for deficiency anemia       Relevant Orders   CBC with Differential   Screening for lipoid disorders       Relevant Orders   Lipid panel   Screening for endocrine, metabolic and immunity disorder       Relevant Orders   Comprehensive metabolic panel  Hemoglobin A1c   TSH      Modifiable risk factors discussed with patient. Anticipatory guidance according to age provided. The following topics were also discussed: Social Determinants of Health Smoking.  Non-smoker Diet and nutrition and need to decrease amount of daily carbohydrate intake and daily calories and increase amount of plant-based protein in her diet Benefits of exercise Cancer family history review Vaccinations review and recommendations Cardiovascular risk assessment and need for blood work Mental health including depression and anxiety Fall and accident prevention  Patient Instructions  Health Maintenance, Female Adopting a healthy lifestyle and getting preventive care are important in promoting health and wellness. Ask your health care provider about: The right schedule for you to have regular tests and exams. Things you can do on your own to prevent diseases and keep yourself healthy. What should I know about diet, weight, and exercise? Eat a healthy diet  Eat a diet that includes plenty of vegetables, fruits, low-fat dairy products, and lean protein. Do not eat a lot of foods that are high in solid fats, added sugars, or sodium. Maintain a healthy weight Body mass index (BMI) is used to identify weight problems. It estimates body fat based on height and weight. Your health care provider can help determine your BMI and help you achieve or maintain a healthy weight. Get regular exercise Get regular exercise.  This is one of the most important things you can do for your health. Most adults should: Exercise for at least 150 minutes each week. The exercise should increase your heart rate and make you sweat (moderate-intensity exercise). Do strengthening exercises at least twice a week. This is in addition to the moderate-intensity exercise. Spend less time sitting. Even light physical activity can be beneficial. Watch cholesterol and blood lipids Have your blood tested for lipids and cholesterol at 43 years of age, then have this test every 5 years. Have your cholesterol levels checked more often if: Your lipid or cholesterol levels are high. You are older than 43 years of age. You are at high risk for heart disease. What should I know about cancer screening? Depending on your health history and family history, you may need to have cancer screening at various ages. This may include screening for: Breast cancer. Cervical cancer. Colorectal cancer. Skin cancer. Lung cancer. What should I know about heart disease, diabetes, and high blood pressure? Blood pressure and heart disease High blood pressure causes heart disease and increases the risk of stroke. This is more likely to develop in people who have high blood pressure readings or are overweight. Have your blood pressure checked: Every 3-5 years if you are 42-50 years of age. Every year if you are 19 years old or older. Diabetes Have regular diabetes screenings. This checks your fasting blood sugar level. Have the screening done: Once every three years after age 44 if you are at a normal weight and have a low risk for diabetes. More often and at a younger age if you are overweight or have a high risk for diabetes. What should I know about preventing infection? Hepatitis B If you have a higher risk for hepatitis B, you should be screened for this virus. Talk with your health care provider to find out if you are at risk for hepatitis B  infection. Hepatitis C Testing is recommended for: Everyone born from [birth year] through 1966. Anyone with known risk factors for hepatitis C. Sexually transmitted infections (STIs) Get screened for STIs, including gonorrhea and chlamydia, if: You  are sexually active and are younger than 43 years of age. You are older than 43 years of age and your health care provider tells you that you are at risk for this type of infection. Your sexual activity has changed since you were last screened, and you are at increased risk for chlamydia or gonorrhea. Ask your health care provider if you are at risk. Ask your health care provider about whether you are at high risk for HIV. Your health care provider may recommend a prescription medicine to help prevent HIV infection. If you choose to take medicine to prevent HIV, you should first get tested for HIV. You should then be tested every 3 months for as long as you are taking the medicine. Pregnancy If you are about to stop having your period (premenopausal) and you may become pregnant, seek counseling before you get pregnant. Take 400 to 800 micrograms (mcg) of folic acid every day if you become pregnant. Ask for birth control (contraception) if you want to prevent pregnancy. Osteoporosis and menopause Osteoporosis is a disease in which the bones lose minerals and strength with aging. This can result in bone fractures. If you are 54 years old or older, or if you are at risk for osteoporosis and fractures, ask your health care provider if you should: Be screened for bone loss. Take a calcium or vitamin D supplement to lower your risk of fractures. Be given hormone replacement therapy (HRT) to treat symptoms of menopause. Follow these instructions at home: Alcohol use Do not drink alcohol if: Your health care provider tells you not to drink. You are pregnant, may be pregnant, or are planning to become pregnant. If you drink alcohol: Limit how much you have  to: 0-1 drink a day. Know how much alcohol is in your drink. In the [place] one drink equals one 12 oz bottle of beer (355 mL), one 5 oz glass of wine (148 mL), or one 1 oz glass of hard liquor (44 mL). Lifestyle Do not use any products that contain nicotine or tobacco. These products include cigarettes, chewing tobacco, and vaping devices, such as e-cigarettes. If you need help quitting, ask your health care provider. Do not use street drugs. Do not share needles. Ask your health care provider for help if you need support or information about quitting drugs. General instructions Schedule regular health, dental, and eye exams. Stay current with your vaccines. Tell your health care provider if: You often feel depressed. You have ever been abused or do not feel safe at home. Summary Adopting a healthy lifestyle and getting preventive care are important in promoting health and wellness. Follow your health care provider's instructions about healthy diet, exercising, and getting tested or screened for diseases. Follow your health care provider's instructions on monitoring your cholesterol and blood pressure. This information is not intended to replace advice given to you by your health care provider. Make sure you discuss any questions you have with your health care provider. Document Revised: 12/11/2020 Document Reviewed: 12/11/2020 Elsevier Patient Education  2024 [employer]      Edwina Barth, MD [facility]

## 2023-04-14 NOTE — Patient Instructions (Signed)

## 2023-04-15 LAB — HEPATITIS C ANTIBODY: Hepatitis C Ab: NONREACTIVE

## 2023-04-15 LAB — HEMOGLOBIN A1C: Hgb A1c MFr Bld: 5.4 % (ref 4.6–6.5)

## 2023-09-26 ENCOUNTER — Encounter (HOSPITAL_COMMUNITY): Payer: Self-pay | Admitting: *Deleted

## 2024-01-14 DIAGNOSIS — H52223 Regular astigmatism, bilateral: Secondary | ICD-10-CM | POA: Diagnosis not present
